# Patient Record
Sex: Female | Born: 1980 | Race: Black or African American | Hispanic: No | Marital: Single | Smoking: Former smoker
Health system: Southern US, Community
[De-identification: ages and names within clinical notes are randomized; demographics above are authoritative.]

## PROBLEM LIST (undated history)

## (undated) DIAGNOSIS — Z8619 Personal history of other infectious and parasitic diseases: Secondary | ICD-10-CM

## (undated) DIAGNOSIS — I10 Essential (primary) hypertension: Secondary | ICD-10-CM

## (undated) DIAGNOSIS — J309 Allergic rhinitis, unspecified: Secondary | ICD-10-CM

## (undated) DIAGNOSIS — B029 Zoster without complications: Secondary | ICD-10-CM

## (undated) DIAGNOSIS — J45909 Unspecified asthma, uncomplicated: Secondary | ICD-10-CM

## (undated) HISTORY — DX: Personal history of other infectious and parasitic diseases: Z86.19

## (undated) HISTORY — DX: Essential (primary) hypertension: I10

## (undated) HISTORY — DX: Zoster without complications: B02.9

## (undated) HISTORY — DX: Allergic rhinitis, unspecified: J30.9

## (undated) HISTORY — DX: Unspecified asthma, uncomplicated: J45.909

---

## 2004-04-22 ENCOUNTER — Emergency Department: Payer: Self-pay | Admitting: Emergency Medicine

## 2008-01-20 ENCOUNTER — Ambulatory Visit: Payer: Self-pay | Admitting: Nurse Practitioner

## 2008-03-30 ENCOUNTER — Encounter: Payer: Self-pay | Admitting: Nurse Practitioner

## 2008-03-30 ENCOUNTER — Encounter: Payer: Self-pay | Admitting: Family Medicine

## 2008-03-30 ENCOUNTER — Ambulatory Visit: Payer: Self-pay | Admitting: Nurse Practitioner

## 2008-03-30 LAB — CONVERTED CEMR LAB
ALT: 19 units/L (ref 0–35)
AST: 15 units/L (ref 0–37)
Albumin: 4.1 g/dL (ref 3.5–5.2)
Alkaline Phosphatase: 51 units/L (ref 39–117)
BUN: 9 mg/dL (ref 6–23)
CO2: 20 meq/L (ref 19–32)
Calcium: 8.8 mg/dL (ref 8.4–10.5)
Chloride: 110 meq/L (ref 96–112)
Creatinine, Ser: 0.53 mg/dL (ref 0.40–1.20)
Glucose, Bld: 103 mg/dL — ABNORMAL HIGH (ref 70–99)
HCT: 39 % (ref 36.0–46.0)
Hemoglobin: 13 g/dL (ref 12.0–15.0)
MCHC: 33.3 g/dL (ref 30.0–36.0)
MCV: 85.9 fL (ref 78.0–100.0)
Platelets: 245 10*3/uL (ref 150–400)
Potassium: 4.1 meq/L (ref 3.5–5.3)
RBC: 4.54 M/uL (ref 3.87–5.11)
RDW: 14.2 % (ref 11.5–15.5)
Sodium: 143 meq/L (ref 135–145)
TSH: 1.473 microintl units/mL (ref 0.350–4.50)
Total Bilirubin: 0.5 mg/dL (ref 0.3–1.2)
Total Protein: 6.4 g/dL (ref 6.0–8.3)
WBC: 6.5 10*3/uL (ref 4.0–10.5)

## 2008-03-31 ENCOUNTER — Encounter: Payer: Self-pay | Admitting: Family Medicine

## 2008-03-31 LAB — CONVERTED CEMR LAB
Clue Cells Wet Prep HPF POC: NONE SEEN
Trich, Wet Prep: NONE SEEN
Yeast Wet Prep HPF POC: NONE SEEN

## 2009-04-05 ENCOUNTER — Ambulatory Visit: Payer: Self-pay | Admitting: Obstetrics and Gynecology

## 2009-05-03 ENCOUNTER — Ambulatory Visit: Payer: Self-pay | Admitting: Obstetrics & Gynecology

## 2009-06-07 ENCOUNTER — Ambulatory Visit: Payer: Self-pay | Admitting: Obstetrics & Gynecology

## 2009-06-09 ENCOUNTER — Ambulatory Visit: Payer: Self-pay | Admitting: Obstetrics & Gynecology

## 2009-06-10 ENCOUNTER — Encounter: Payer: Self-pay | Admitting: Obstetrics & Gynecology

## 2009-11-08 ENCOUNTER — Ambulatory Visit: Payer: Self-pay | Admitting: Nurse Practitioner

## 2009-11-09 ENCOUNTER — Encounter: Payer: Self-pay | Admitting: Obstetrics & Gynecology

## 2009-11-09 LAB — CONVERTED CEMR LAB
Trich, Wet Prep: NONE SEEN
Yeast Wet Prep HPF POC: NONE SEEN

## 2010-07-11 NOTE — Assessment & Plan Note (Signed)
NAME:  Sabrina Smith, Sabrina Smith NO.:  [id number]   MEDICAL RECORD NO.:  [id number]          PATIENT TYPE:  POB   LOCATION:  [facility] at [facility]         FACILITY:  [facility]   PHYSICIAN:  Allie Bossier, MD        DATE OF BIRTH:  05-Nov-1980   DATE OF SERVICE:  11/08/2009                                  CLINIC NOTE   The patient comes to the office today for a repeat Pap smear following  her colposcopy in March 2011.  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  The patient has been doing well since then except that she does  feel that she is having repeat of her bacterial vaginosis.  She has been  trying some over-the-counter medications.   PHYSICAL EXAMINATION:  GENERAL:  Well-developed, well-nourished 30-year-  old African American female in no acute distress.  VITAL SIGNS:  Blood pressure is 93/64, weight 158, pulse is 73, height  is 5 feet 3 inches.  PELVIC:  Genital externally.  There are no lesions or discharges  internally.  Vaginal vault, there is a thick white odorous discharge.  Cervix is without lesions.  Bimanual exam, no cervical motion  tenderness.   ASSESSMENT:  1. 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  2. Bacterial vaginosis.  The patient is given a prescription for      Tindamax 500 mg 4 tablets to be taken at one time.  She will be      given a refill on that.  She is also given 2 sample tablets.  She      will return in February for her yearly exam or sooner as needed.      Remonia Richter, NP    ______________________________  Allie Bossier, MD    LR/MEDQ  D:  11/08/2009  T:  11/09/2009  Job:  [id number]

## 2010-07-11 NOTE — Assessment & Plan Note (Signed)
NAME:  Sabrina Smith, Sabrina Smith NO.:  [id number]   MEDICAL RECORD NO.:  [id number]          PATIENT TYPE:  POB   LOCATION:  [facility] at [facility]         FACILITY:  [facility]   PHYSICIAN:  Catalina Antigua, MD     DATE OF BIRTH:  29-Apr-1980   DATE OF SERVICE:  04/05/2009                                  CLINIC NOTE   This is a 30 year old gravida 2, para 1-0-1-1, who presents today for  annual exam.  The patient is without any complaints.  Denies any  abnormal bleeding or discharge or pain.  The patient is no longer taking  her birth control pills as she states that she has not really been  complaint with taking them on a daily basis.  She is currently using  condoms for birth control on an on and off basis.   Past medical history is significant for asthma for which she takes  albuterol on a p.r.n. basis.   PAST SURGICAL HISTORY:  She denies.   PAST OB HISTORY:  She had a vaginal delivery and a miscarriage.   PAST GYN HISTORY:  She denies any history of abnormal Pap smear, cyst,  or fibroids.  Her last Pap smear was normal in January 2009.   SOCIAL HISTORY:  She denies drinking, smoking, or the use of illicit  drugs.   She reports an allergy to SULFA, which she breaks out in hives.   She is not currently taking any medications.   REVIEW OF SYSTEMS:  Otherwise within normal limits.   PHYSICAL EXAMINATION:  VITAL SIGNS:  Blood pressure is 102/65, pulse of  75, weight of 154 pounds.  LUNGS:  Clear to auscultation bilaterally.  HEART:  Regular rate and rhythm.  BREASTS:  Equal in size.  No skin or nipple dimpling.  No expressible  nipple discharge.  No palpable masses or lymphadenopathy.  ABDOMEN:  Soft, nontender, nondistended.  PELVIC:  She has normal vaginal mucosa and normal-appearing cervix.  No  abnormal bleeding or discharge.  Bimanual exam, she has a small  anteverted uterus.  No palpable adnexal masses or tenderness.   ASSESSMENT AND PLAN:  This is a 30 year old  para 1-0-1-1, who presents  for annual exam.  Pap smear and cultures were performed.  Birth control  options were reviewed.  The patient was previously counseled for implant  on an intrauterine device and is now opting for intrauterine device  placement.  The patient declined intrauterine device placement today and  states that she will schedule the insertion and information packet for  Mirena intrauterine device was provided.  The patient will be contacted  with any abnormal results.  The patient will return for intrauterine  device placement and otherwise in a year for annual exam.  The patient  was also advised to continue the religious use of condoms for protection  against sexually transmitted diseases.           ______________________________  Catalina Antigua, MD     PC/MEDQ  D:  04/05/2009  T:  04/06/2009  Job:  [id number]

## 2010-07-11 NOTE — Assessment & Plan Note (Signed)
NAME:  Sabrina Smith, KUNA NO.:  [id number]   MEDICAL RECORD NO.:  [id number]          PATIENT TYPE:  POB   LOCATION:  [facility] at [facility]         FACILITY:  [facility]   PHYSICIAN:  Johnella Moloney, MD        DATE OF BIRTH:  05/21/1980   DATE OF SERVICE:  01/20/2008                                  CLINIC NOTE   The patient comes to office today for 2 separate issues.  The first is  that she would like to discuss birth-control options.  She had been  given Loestrin previously and she had a breakthrough bleeding for 1  month and this was not acceptable for her.  She could like to discuss  IUDs and also increasing the dose of her birth-control pill.  Her last  menstrual period was December 24, 2007.  She did have some unusual days  of bleeding during that time.  She did take a home pregnancy test last  week which was faintly positive and she took later one which was  negative.  The patient would also like to discuss a vaginal discharge  that comes and goes.  It does have an odor and there is no itching.  She  has no change in sexual partners.   LABORATORY DATA:  GPT was negative.  Wet prep is positive for whiff, pH  is greater than 5.0 and positive for clue cells.   PHYSICAL EXAMINATION:  GENERAL:  Well-developed, well-nourished 30-year-  old African American female in no acute distress.  EXTERNAL GENITALIA:  There is a small amount of white odorous discharge  externally.  Vaginally, the same discharge.  Internally, cervix is  closed.  No lesions.  Bimanual exam, no cervical motion tenderness or  adnexal mass.   ASSESSMENT AND PLAN:  1. Bacterial vaginosis.  The patient will be given Flagyl 500 mg 1      p.o. b.i.d. for 7 days.  She was advised not to drink any alcohol      during this time.  2. Contraception.  We did have a rather lengthy discussion concerning      IUDs and birth control.  At this point, the patient will opt for      birth control pills.  She is given a  prescription for Necon 1 p.o.      daily #28 with 3 refills.  The patient is asked to take this on      Sunday following her menstrual cycle.  She is asked to use condoms      for 1 month.  She will follow up in 2 months for her Pap smear and      we can discuss further an IUD at that time.  She is given a      brochure on the Mirena as well as the ParaGard.      Remonia Richter, NP    ______________________________  Johnella Moloney, MD    LR/MEDQ  D:  01/20/2008  T:  01/21/2008  Job:  [id number]

## 2010-07-11 NOTE — Assessment & Plan Note (Signed)
NAME:  Sabrina Smith, Sabrina Smith NO.:  [id number]   MEDICAL RECORD NO.:  [id number]          PATIENT TYPE:  POB   LOCATION:  [facility] at [facility]         FACILITY:  [facility]   PHYSICIAN:  Argentina Donovan, MD        DATE OF BIRTH:  Aug 01, 1980   DATE OF SERVICE:                                  CLINIC NOTE   The patient comes office today for her yearly Pap smear, physical exam,  and she is also complaining of vaginal discharge.  The patient, on her  last visit back in October, was complaining of similar type of vaginal  discharge.  We did do a wet prep, and she did have BV.  She did use the  Flagyl and realized that the odor came back after approximately 1 week.  There is some question whether her relationship is faithful.  Her  boyfriend may be going back and forth with the mother of his child.  The  patient did not do well with her birth control pills.  She feels that  she has some difficulty staying on a routine with birth control pills,  is not quite sure about the IUD at this point and would like to discuss  the Implanon, which we did at length.  Otherwise, the patient is not  having any problems.  She does request that we do some blood work today  including some sugar tests that her mother is interested in having.   PHYSICAL EXAM:  GENERAL:  Well-developed, well-nourished 30 year old  African American female in no acute distress.  VITAL SIGNS:  Blood pressure 114/71, pulse 82, weight 149, height 5 feet  3 inches.  HEENT:  Head is normocephalic and atraumatic.  Thyroid is without  enlargement or nodules.  CARDIAC:  Regular rate and rhythm.  LUNGS:  Clear bilaterally.  BREASTS:  Without masses.  There is no nipple discharge.  ABDOMEN:  Soft, nontender.  No mass.  GENITALIA:  Externally, there are no lesions or discharges.  There is a  slight odor.  Internally, vaginal walls have a small amount of whitish  discharge, otherwise negative.  Cervix is closed.  No lesions.  No  cervical motion tenderness.  Bimanual Exam:  Again no cervical motion  tenderness or adnexal mass.  No tenderness.  EXTREMITIES:  Good pulses.  Skin color is normal.  Extremities are  within normal range.   ASSESSMENT/PLAN:  1. Yearly Pap and pelvic.  2. Contraception.  3. Bacterial vaginosis.   PLAN:  1. To give the patient verbal as well as written information on the      Implanon.  2. We will give her a prescription for Flagyl 500 mg 1 p.o. b.i.d. for      7 days.  She is aware that she is not to take any alcohol with      this.  She and her partner are no longer sexually active.  3. CBC, C-MET, and a TSH will be done on this patient today, and she      will return to the clinic in 1 week for Implanon insertion, and we      will discuss her labs at the  same time.      Remonia Richter, NP    ______________________________  Argentina Donovan, MD    LR/MEDQ  D:  03/30/2008  T:  03/31/2008  Job:  [id number]

## 2010-07-17 ENCOUNTER — Ambulatory Visit: Payer: Self-pay

## 2011-04-11 ENCOUNTER — Encounter: Payer: Self-pay | Admitting: Family Medicine

## 2011-04-11 ENCOUNTER — Ambulatory Visit (INDEPENDENT_AMBULATORY_CARE_PROVIDER_SITE_OTHER): Payer: Federal, State, Local not specified - PPO | Admitting: Family Medicine

## 2011-04-11 VITALS — BP 110/72 | HR 68 | Temp 98.7°F | Ht 62.0 in | Wt 145.2 lb

## 2011-04-11 DIAGNOSIS — Z0000 Encounter for general adult medical examination without abnormal findings: Secondary | ICD-10-CM | POA: Insufficient documentation

## 2011-04-11 DIAGNOSIS — R351 Nocturia: Secondary | ICD-10-CM | POA: Insufficient documentation

## 2011-04-11 DIAGNOSIS — Z23 Encounter for immunization: Secondary | ICD-10-CM

## 2011-04-11 DIAGNOSIS — R209 Unspecified disturbances of skin sensation: Secondary | ICD-10-CM

## 2011-04-11 DIAGNOSIS — R202 Paresthesia of skin: Secondary | ICD-10-CM | POA: Insufficient documentation

## 2011-04-11 LAB — POCT URINALYSIS DIPSTICK
Bilirubin, UA: NEGATIVE
Blood, UA: NEGATIVE
Glucose, UA: NEGATIVE
Ketones, UA: NEGATIVE
Leukocytes, UA: NEGATIVE
Nitrite, UA: NEGATIVE
Protein, UA: NEGATIVE
Spec Grav, UA: <=1.005
Urobilinogen, UA: 0.2
pH, UA: 7.5

## 2011-04-11 NOTE — Progress Notes (Signed)
Addended by: Josph Macho A on: 04/11/2011 11:30 AM   Modules accepted: Orders

## 2011-04-11 NOTE — Assessment & Plan Note (Signed)
Reviewed preventative protocols and updated unless pt declined. Tdap today. rec set up appt for well woman with OBGYN.

## 2011-04-11 NOTE — Assessment & Plan Note (Signed)
Isolated, self limited. Likely benign - reassurred. Reasons to notify me discussed - ie worsening, not resolving, associated with pain or weakness.

## 2011-04-11 NOTE — Assessment & Plan Note (Signed)
Check UA today.  

## 2011-04-11 NOTE — Progress Notes (Signed)
Subjective:    Patient ID: Sabrina Smith, female    DOB: Nov 02, 1980, 31 y.o.   MRN: [id number]  HPI CC: new pt establish  Stays cold - mom with h/o anemia.  Wants this evaluated.  LMP - 03/26/2011.  Regular.  1 wk cycle, heavy bleeding only for a few days.    Started working out more.  Noticing tingling in fingers when running on treadmill.  Both hands, all 5 anterior fingers.  Continued running, and tingling did improve.  Also with mild numbness on arm when sleeping, happened in middle of night.  this happened 3 weeks ago.  These episodes have not recurred.  Nocturia - 3-4x/night.  Has noticed this for last week.  Not as much during day.  Last UTI was over summer.  No dysuria, urgency, back/flank pain, abd pain.  Preventative: Unsure last CPE.   Last well woman with [facility]. Tetanus - thinks >10 yrs ago. Not fasting today.  Caffeine: none Lives with daughter (2006), no pets Occupation: Paramedic Edu: bachelor's Activity: treadmill and workout tapes Diet: seldom water, fruits/vegetables daily, red meat rare, fish 3x/wk  Medications and allergies reviewed and updated in chart.  Past histories reviewed and updated if relevant as below. There is no problem list on file for this patient.  Past Medical History  Diagnosis Date  . History of chicken pox   . Shingles 1990s   Past Surgical History  Procedure Date  . No past surgeries    History  Substance Use Topics  . Smoking status: Former Smoker    Quit date: 09/27/2010  . Smokeless tobacco: Never Used  . Alcohol Use: Yes     Occasional   Family History  Problem Relation Age of Onset  . Diabetes Maternal Aunt   . Hypertension Maternal Aunt   . Diabetes Maternal Uncle   . Hypertension Maternal Uncle   . Stroke Maternal Uncle   . Heart disease Maternal Grandmother   . Diabetes Maternal Grandfather   . Cancer Maternal Grandfather     prostate   Allergies  Allergen Reactions  . Sulfa Drugs Cross  Reactors Hives   No current outpatient prescriptions on file prior to visit.     Review of Systems  Constitutional: Negative for fever, chills, activity change, appetite change, fatigue and unexpected weight change.  HENT: Negative for hearing loss and neck pain.   Eyes: Negative for visual disturbance.  Respiratory: Negative for cough, chest tightness, shortness of breath and wheezing.   Cardiovascular: Negative for chest pain, palpitations and leg swelling.  Gastrointestinal: Negative for nausea, vomiting, abdominal pain, diarrhea, constipation, blood in stool and abdominal distention.  Genitourinary: Positive for frequency (3-4x nocturia). Negative for hematuria and difficulty urinating.  Musculoskeletal: Negative for myalgias and arthralgias.  Skin: Negative for rash.  Neurological: Negative for dizziness, seizures, syncope and headaches.  Hematological: Does not bruise/bleed easily.  Psychiatric/Behavioral: Negative for dysphoric mood. The patient is not nervous/anxious.        Objective:   Physical Exam  Nursing note and vitals reviewed. Constitutional: She is oriented to person, place, and time. She appears well-developed and well-nourished. No distress.  HENT:  Head: Normocephalic and atraumatic.  Right Ear: External ear normal.  Left Ear: External ear normal.  Nose: Nose normal.  Mouth/Throat: Oropharynx is clear and moist. No oropharyngeal exudate.  Eyes: Conjunctivae and EOM are normal. Pupils are equal, round, and reactive to light. No scleral icterus.  Neck: Normal range of motion. Neck supple.  Cardiovascular:  Normal rate, regular rhythm, normal heart sounds and intact distal pulses.   No murmur heard. Pulses:      Radial pulses are 2+ on the right side, and 2+ on the left side.  Pulmonary/Chest: Effort normal and breath sounds normal. No respiratory distress. She has no wheezes. She has no rales.  Abdominal: Soft. Bowel sounds are normal. She exhibits no  distension and no mass. There is no tenderness. There is no rebound and no guarding.  Musculoskeletal: Normal range of motion.       FROm neck. No midline spine tenderness, no trap tightness/spasm. Neg spurling bilaterally  Lymphadenopathy:    She has no cervical adenopathy.  Neurological: She is alert and oriented to person, place, and time. She has normal strength and normal reflexes. No sensory deficit. She exhibits normal muscle tone.  Reflex Scores:      Bicep reflexes are 2+ on the right side and 2+ on the left side.      Brachioradialis reflexes are 2+ on the right side and 2+ on the left side.      Patellar reflexes are 2+ on the right side and 2+ on the left side.      Achilles reflexes are 2+ on the right side and 2+ on the left side.      CN grossly intact, station and gait intact sensation intact to light touch, to temperature discrimination  Skin: Skin is warm and dry. No rash noted.  Psychiatric: She has a normal mood and affect. Her behavior is normal. Judgment and thought content normal.      Assessment & Plan:

## 2011-04-11 NOTE — Patient Instructions (Signed)
Urinalysis today for nocturia Return at your convenience fasting for blood work. Tdap today. Good to meet you today, call us with questions.

## 2011-04-16 ENCOUNTER — Other Ambulatory Visit (INDEPENDENT_AMBULATORY_CARE_PROVIDER_SITE_OTHER): Payer: Federal, State, Local not specified - PPO

## 2011-04-16 DIAGNOSIS — Z0000 Encounter for general adult medical examination without abnormal findings: Secondary | ICD-10-CM

## 2011-04-16 DIAGNOSIS — R202 Paresthesia of skin: Secondary | ICD-10-CM

## 2011-04-16 DIAGNOSIS — R209 Unspecified disturbances of skin sensation: Secondary | ICD-10-CM

## 2011-04-16 LAB — CBC WITH DIFFERENTIAL/PLATELET
Basophils Absolute: 0 10*3/uL (ref 0.0–0.1)
Basophils Relative: 0.2 % (ref 0.0–3.0)
Eosinophils Absolute: 0.3 10*3/uL (ref 0.0–0.7)
Eosinophils Relative: 3.9 % (ref 0.0–5.0)
HCT: 39.9 % (ref 36.0–46.0)
Hemoglobin: 13 g/dL (ref 12.0–15.0)
Lymphocytes Relative: 24.8 % (ref 12.0–46.0)
Lymphs Abs: 1.8 10*3/uL (ref 0.7–4.0)
MCHC: 32.6 g/dL (ref 30.0–36.0)
MCV: 88.4 fl (ref 78.0–100.0)
Monocytes Absolute: 0.7 10*3/uL (ref 0.1–1.0)
Monocytes Relative: 9 % (ref 3.0–12.0)
Neutro Abs: 4.6 10*3/uL (ref 1.4–7.7)
Neutrophils Relative %: 62.1 % (ref 43.0–77.0)
Platelets: 210 10*3/uL (ref 150.0–400.0)
RBC: 4.51 Mil/uL (ref 3.87–5.11)
RDW: 14.4 % (ref 11.5–14.6)
WBC: 7.3 10*3/uL (ref 4.5–10.5)

## 2011-04-16 LAB — LIPID PANEL
Cholesterol: 127 mg/dL (ref 0–200)
HDL: 44.1 mg/dL (ref >39.00)
LDL Cholesterol: 70 mg/dL (ref 0–99)
Total CHOL/HDL Ratio: 3
Triglycerides: 64 mg/dL (ref 0.0–149.0)
VLDL: 12.8 mg/dL (ref 0.0–40.0)

## 2011-04-16 LAB — BASIC METABOLIC PANEL
BUN: 14 mg/dL (ref 6–23)
CO2: 27 mEq/L (ref 19–32)
Calcium: 8.8 mg/dL (ref 8.4–10.5)
Chloride: 108 mEq/L (ref 96–112)
Creatinine, Ser: 0.7 mg/dL (ref 0.4–1.2)
GFR: 113.71 mL/min (ref >60.00)
Glucose, Bld: 89 mg/dL (ref 70–99)
Potassium: 3.9 mEq/L (ref 3.5–5.1)
Sodium: 141 mEq/L (ref 135–145)

## 2011-04-16 LAB — VITAMIN B12: Vitamin B-12: 727 pg/mL (ref 211–911)

## 2011-04-16 LAB — TSH: TSH: 1.41 u[IU]/mL (ref 0.35–5.50)

## 2012-01-30 ENCOUNTER — Ambulatory Visit (INDEPENDENT_AMBULATORY_CARE_PROVIDER_SITE_OTHER): Payer: Federal, State, Local not specified - PPO | Admitting: Internal Medicine

## 2012-01-30 ENCOUNTER — Encounter: Payer: Self-pay | Admitting: Family Medicine

## 2012-01-30 ENCOUNTER — Encounter: Payer: Self-pay | Admitting: Internal Medicine

## 2012-01-30 VITALS — BP 98/60 | HR 60 | Temp 97.9°F | Wt 149.0 lb

## 2012-01-30 DIAGNOSIS — J0100 Acute maxillary sinusitis, unspecified: Secondary | ICD-10-CM | POA: Insufficient documentation

## 2012-01-30 DIAGNOSIS — J019 Acute sinusitis, unspecified: Secondary | ICD-10-CM

## 2012-01-30 MED ORDER — AMOXICILLIN 500 MG PO TABS: 1000.0000 mg | ORAL_TABLET | Freq: Two times a day (BID) | ORAL | Status: DC

## 2012-01-30 NOTE — Progress Notes (Signed)
  Subjective:    Patient ID: Sabrina Smith, female    DOB: 06-28-1980, 31 y.o.   MRN: [id number]  HPI Has been achy for the past 2 days Stiff neck at first--4 days ago Some earaches also--2 days ago Cough for several weeks Some chest congestion for the past month mucinex had loosened up the mucussome but now not productive again  No fever No SOB Headache last night---no others No sore throat Mild rhinorrhea but not overly congested  No other meds for this  No current outpatient prescriptions on file prior to visit.    Allergies  Allergen Reactions  . Sulfa Drugs Cross Reactors Hives    Past Medical History  Diagnosis Date  . History of chicken pox   . Shingles 1990s    Past Surgical History  Procedure Date  . No past surgeries     Family History  Problem Relation Age of Onset  . Diabetes Maternal Aunt   . Hypertension Maternal Aunt   . Diabetes Maternal Uncle   . Hypertension Maternal Uncle   . Stroke Maternal Uncle   . Heart disease Maternal Grandmother   . Diabetes Maternal Grandfather   . Cancer Maternal Grandfather     prostate    History   Social History  . Marital Status: Single    Spouse Name: N/A    Number of Children: N/A  . Years of Education: N/A   Occupational History  . Sales Associate [employer]   Social History Main Topics  . Smoking status: Former Smoker    Quit date: 09/27/2010  . Smokeless tobacco: Never Used  . Alcohol Use: Yes     Comment: Occasional  . Drug Use: No  . Sexually Active: Not on file   Other Topics Concern  . Not on file   Social History Narrative   Caffeine: noneLives with daughter (2006), no petsOccupation: postal workerEdu: bachelor'sActivity: treadmill and workout tapesDiet: seldom water, fruits/vegetables daily, red meat rare, fish 3x/wk   Review of Systems No rash No pink eye No vomiting or diarrhea    Objective:   Physical Exam  Constitutional: She appears well-developed and  well-nourished. No distress.  HENT:       No sinus tenderness Marked sinus congestion and mild inflammation Mild pharyngeal injection TMs normal  Neck: Normal range of motion. Neck supple.  Pulmonary/Chest: Effort normal and breath sounds normal. No respiratory distress. She has no wheezes. She has no rales.  Lymphadenopathy:    She has no cervical adenopathy.          Assessment & Plan:

## 2012-01-30 NOTE — Assessment & Plan Note (Addendum)
Has had mild resp illness for several weeks While she localized it to chest, now has findings in nose ?secondary bacterial infection (with sudden aches, etc) Discussed supportive care Start amoxil if worsens

## 2012-01-30 NOTE — Patient Instructions (Signed)
Please start the amoxicillin antibiotic if you are worsening in the next few days. 

## 2012-02-13 ENCOUNTER — Telehealth: Payer: Self-pay | Admitting: Family Medicine

## 2012-02-13 NOTE — Telephone Encounter (Signed)
Patient Information:  Caller Name: Ventura Bruns  Phone: [phone]  Patient: Sabrina Smith, Sabrina Smith  Gender: Female  DOB: 1980/11/29  Age: 31 Years  [facility]  Pregnant: No  Office Follow Up:  Does the office need to follow up with this patient?: No  Instructions For The Office: N/A  RN Note:  Completed Amoxicillin 02/10/12 for sinusitis.  No headache.  Fever not tested.; feels chilled.  Nothing comes out when blows nose. Sevee sinus pain present.  Office is closed; advised to go to [facility] urgent care now  Symptoms  Reason For Call & Symptoms: Pain behind nose and body aches,  Reviewed Health History In EMR: Yes  Reviewed Medications In EMR: Yes  Reviewed Allergies In EMR: Yes  Reviewed Surgeries / Procedures: Yes  Date of Onset of Symptoms: 02/12/2012  Treatments Tried: Ibuprofen, Sudafed  Treatments Tried Worked: Yes OB:  LMP: 01/30/2012  Guideline(s) Used:  Sinus Pain and Congestion  Disposition Per Guideline:   Go to ED Now (or to Office with PCP Approval)  Reason For Disposition Reached:   Severe headache and has fever  Advice Given:  Reassurance:   Sinus congestion is a normal part of a cold.  For a Stuffy Nose - Use Nasal Washes:  Introduction: Saline (salt water) nasal irrigation (nasal wash) is an effective and simple home remedy for treating stuffy nose and sinus congestion. The nose can be irrigated by pouring, spraying, or squirting salt water into the nose and then letting it run back out.  Medicines for a Stuffy or Runny Nose:  Most cold medicines that are available over-the-counter (OTC) are not helpful.  Call Back If:   You become worse.  RN Overrode Recommendation:  Go To [facility] (office is closed)

## 2012-02-14 NOTE — Telephone Encounter (Signed)
Noted  

## 2012-06-05 IMAGING — US TRANSABDOMINAL ULTRASOUND OF PELVIS
1 series · 17 of 25 positions shown · non-contrast
Comparison: none

REASON FOR EXAM: RLQ pain
COMMENTS:

[Series 1: transabdominal ultrasound of pelvis · 17 of 49 slices shown]
[im 1/49]
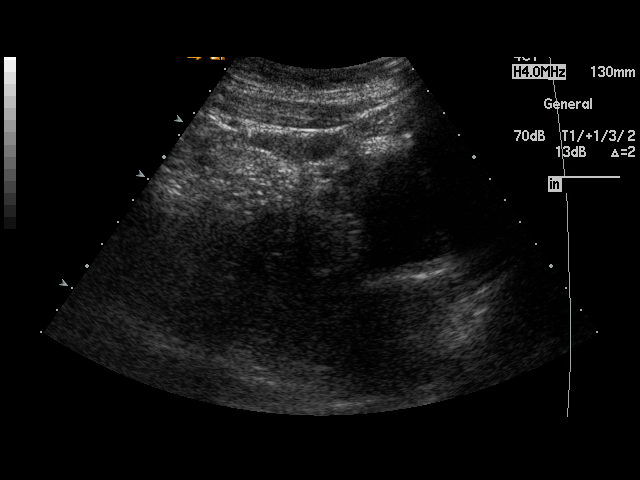
[im 5/49]
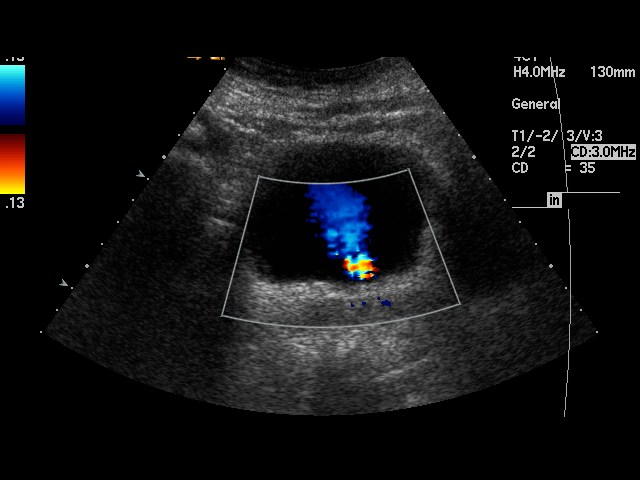
[im 7/49]
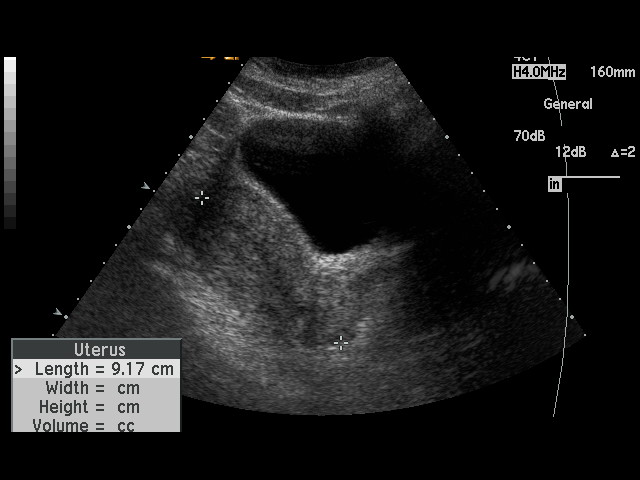
[im 11/49]
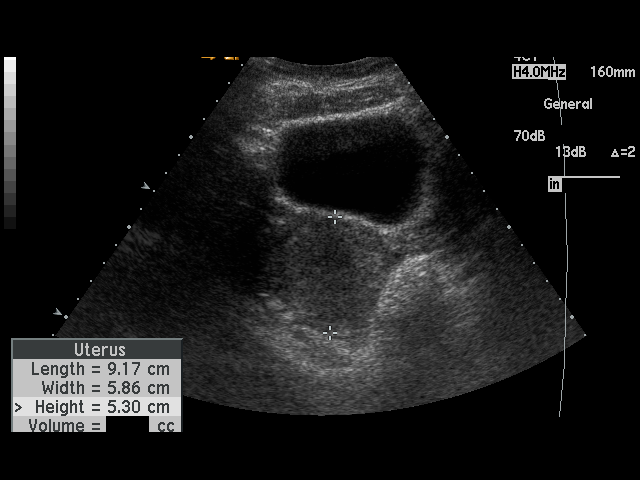
[im 13/49]
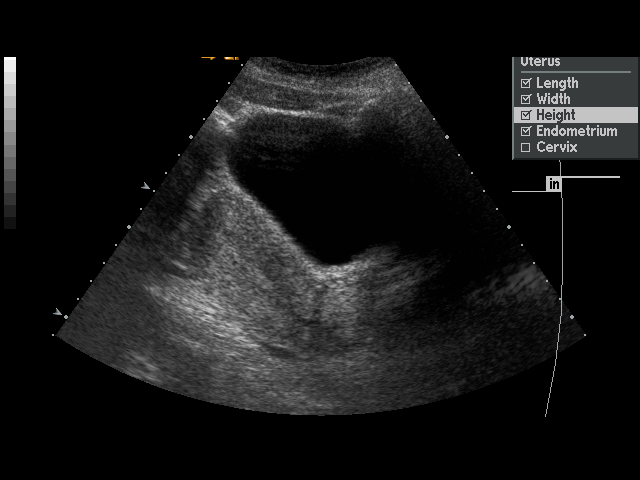
[im 17/49]
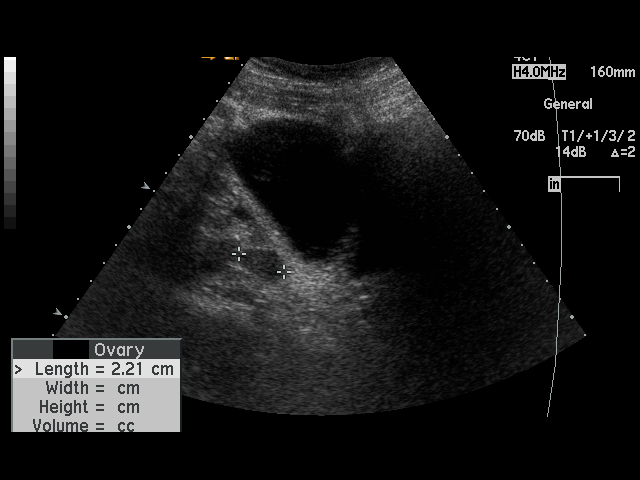
[im 19/49]
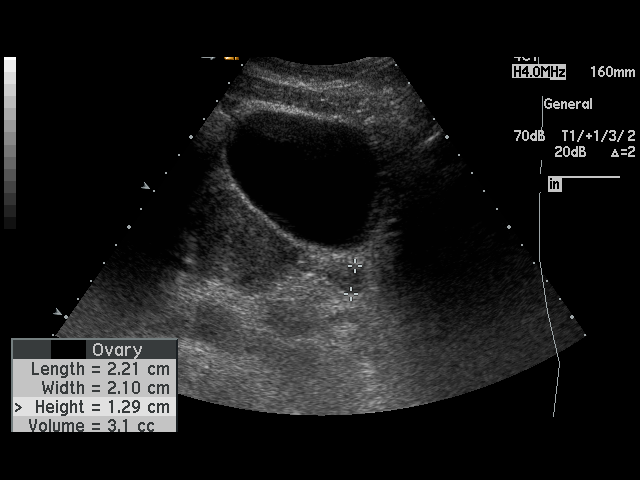
[im 23/49]
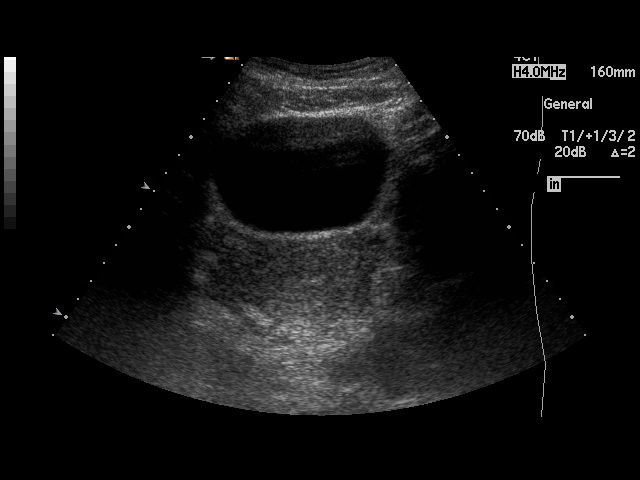
[im 25/49]
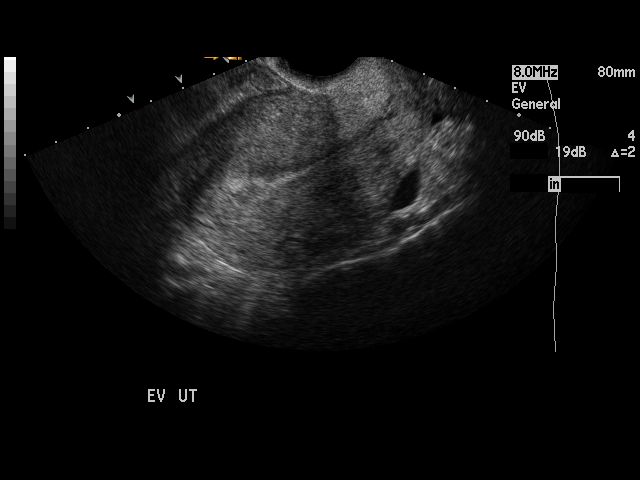
[im 27/49]
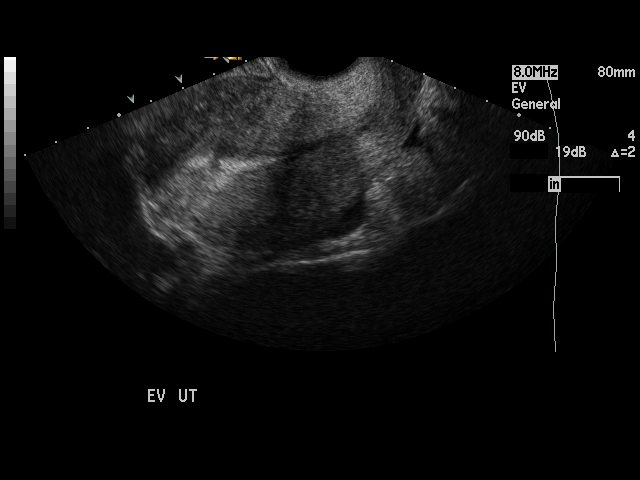
[im 31/49]
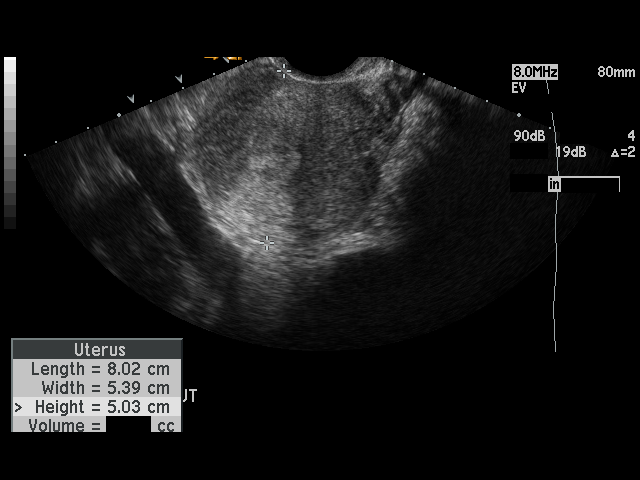
[im 33/49]
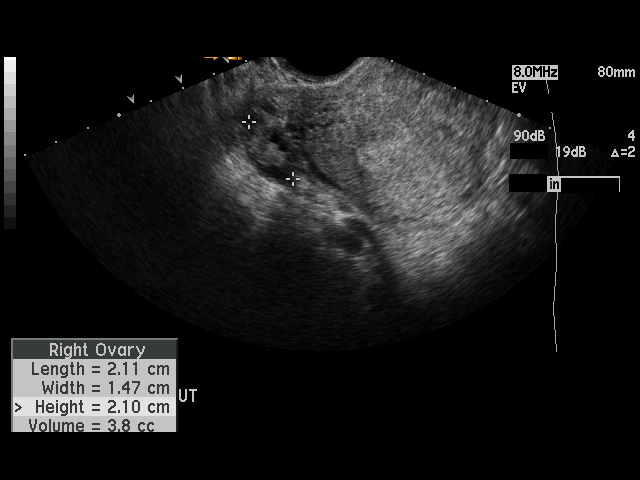
[im 37/49]
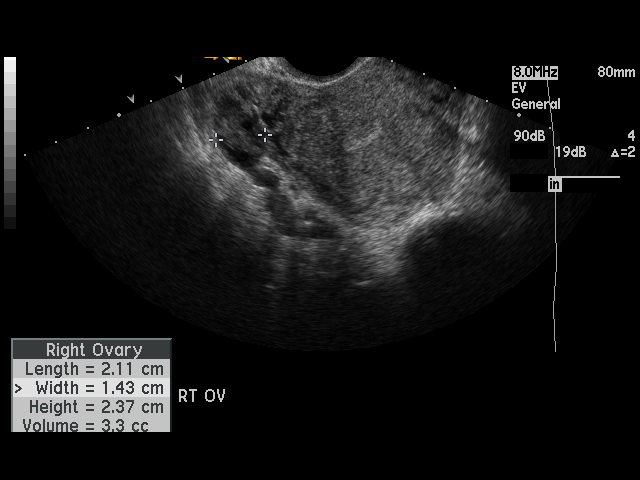
[im 39/49]
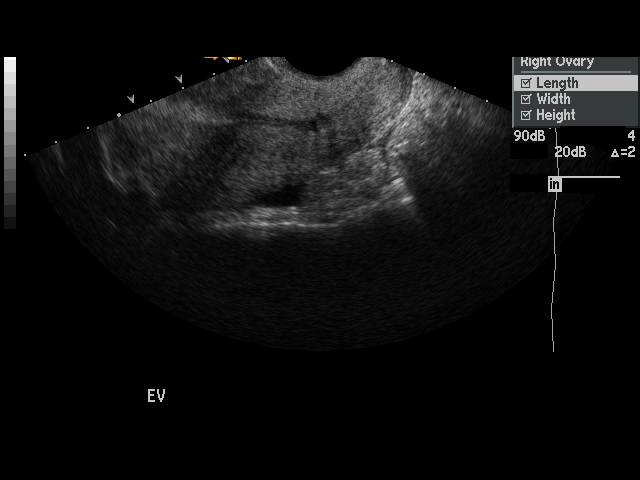
[im 43/49]
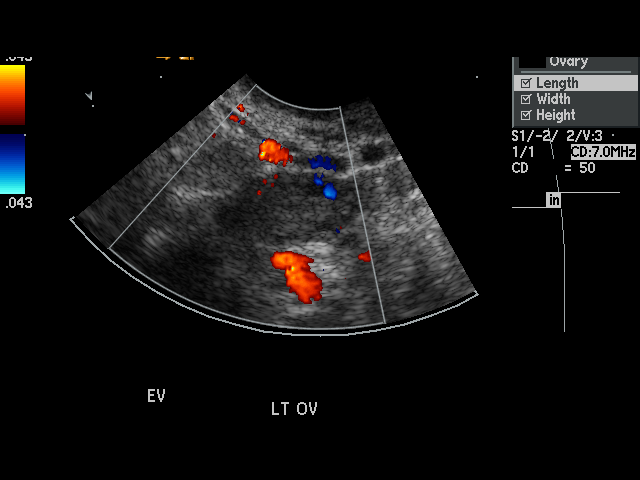
[im 45/49]
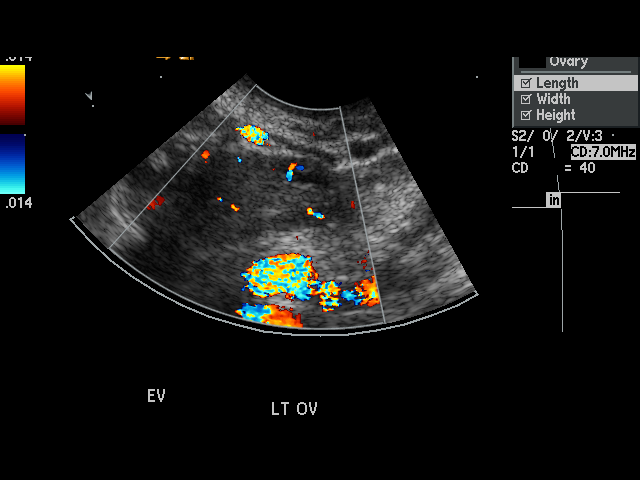
[im 49/49]
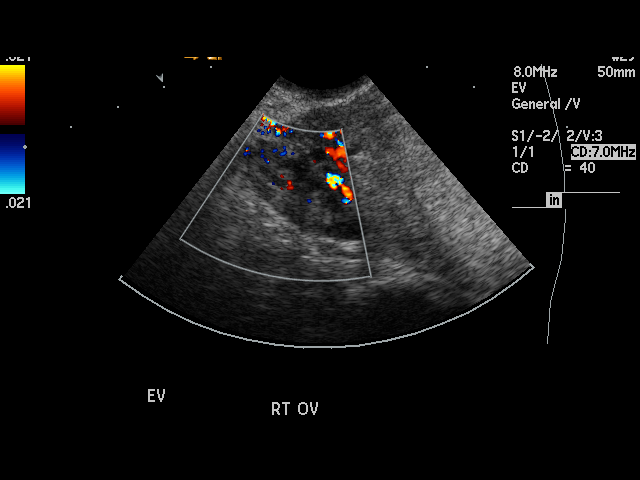

[17 of 25 positions shown; findings below may reference images not displayed]

PROCEDURE:     CRUNCHIEE - CRUNCHIEE PELVIS NON-OB W/TRANSVAGINAL  - July 17, 2010  [DATE]

RESULT:     Transabdominal and endovaginal ultrasound was performed. The
uterus measures 8.59 cm x 5.62 cm x 5.17 cm. The endometrium measures 1.9 mm
in thickness. No uterine mass is seen. The right and left ovaries are
visualized. The right ovary measures 2 cm at maximum diameter and the left
ovary measures 2.2 cm at maximum diameter. Vascular flow is seen in each
ovary on Doppler examination. No torsion is evident. No abnormal adnexal
masses are identified. A few follicles are noted in each ovary. There is a
nonspecific trace of free fluid in the pelvis. The visualized portion of the
urinary bladder is normal in appearance. The kidneys are visualized
bilaterally and show no hydronephrosis.
IMPRESSION: No significant abnormalities are identified.

## 2012-12-08 ENCOUNTER — Ambulatory Visit (INDEPENDENT_AMBULATORY_CARE_PROVIDER_SITE_OTHER): Payer: Federal, State, Local not specified - PPO | Admitting: Family Medicine

## 2012-12-08 ENCOUNTER — Encounter: Payer: Self-pay | Admitting: Family Medicine

## 2012-12-08 VITALS — BP 118/82 | HR 68 | Temp 98.7°F | Wt 144.8 lb

## 2012-12-08 DIAGNOSIS — R5383 Other fatigue: Secondary | ICD-10-CM | POA: Insufficient documentation

## 2012-12-08 DIAGNOSIS — R61 Generalized hyperhidrosis: Secondary | ICD-10-CM

## 2012-12-08 DIAGNOSIS — R5381 Other malaise: Secondary | ICD-10-CM

## 2012-12-08 DIAGNOSIS — Z23 Encounter for immunization: Secondary | ICD-10-CM

## 2012-12-08 LAB — CBC WITH DIFFERENTIAL/PLATELET
Basophils Absolute: 0 10*3/uL (ref 0.0–0.1)
Basophils Relative: 0.4 % (ref 0.0–3.0)
Eosinophils Absolute: 0.2 10*3/uL (ref 0.0–0.7)
Eosinophils Relative: 2 % (ref 0.0–5.0)
HCT: 41.1 % (ref 36.0–46.0)
Hemoglobin: 13.6 g/dL (ref 12.0–15.0)
Lymphocytes Relative: 25.8 % (ref 12.0–46.0)
Lymphs Abs: 2 10*3/uL (ref 0.7–4.0)
MCHC: 33 g/dL (ref 30.0–36.0)
MCV: 89.2 fl (ref 78.0–100.0)
Monocytes Absolute: 0.7 10*3/uL (ref 0.1–1.0)
Monocytes Relative: 8.8 % (ref 3.0–12.0)
Neutro Abs: 4.9 10*3/uL (ref 1.4–7.7)
Neutrophils Relative %: 63 % (ref 43.0–77.0)
Platelets: 239 10*3/uL (ref 150.0–400.0)
RBC: 4.61 Mil/uL (ref 3.87–5.11)
RDW: 13.7 % (ref 11.5–14.6)
WBC: 7.8 10*3/uL (ref 4.5–10.5)

## 2012-12-08 LAB — COMPREHENSIVE METABOLIC PANEL
ALT: 17 U/L (ref 0–35)
AST: 18 U/L (ref 0–37)
Albumin: 4.3 g/dL (ref 3.5–5.2)
Alkaline Phosphatase: 39 U/L (ref 39–117)
BUN: 12 mg/dL (ref 6–23)
CO2: 30 mEq/L (ref 19–32)
Calcium: 9.3 mg/dL (ref 8.4–10.5)
Chloride: 102 mEq/L (ref 96–112)
Creatinine, Ser: 0.7 mg/dL (ref 0.4–1.2)
GFR: 103.26 mL/min (ref >60.00)
Glucose, Bld: 82 mg/dL (ref 70–99)
Potassium: 4 mEq/L (ref 3.5–5.1)
Sodium: 141 mEq/L (ref 135–145)
Total Bilirubin: 1 mg/dL (ref 0.3–1.2)
Total Protein: 7.4 g/dL (ref 6.0–8.3)

## 2012-12-08 LAB — TSH: TSH: 1.3 u[IU]/mL (ref 0.35–5.50)

## 2012-12-08 LAB — T4, FREE: Free T4: 1.01 ng/dL (ref 0.60–1.60)

## 2012-12-08 LAB — SEDIMENTATION RATE: Sed Rate: 9 mm/hr (ref 0–22)

## 2012-12-08 LAB — T3, FREE: T3, Free: 2.6 pg/mL (ref 2.3–4.2)

## 2012-12-08 NOTE — Progress Notes (Signed)
  Subjective:    Patient ID: Sabrina Smith, female    DOB: [birth year], 32 y.o.   MRN: [id number]  HPI CC: fatigue  Stays fatigued - bedtime 10pm, wakes up at 6am.  Sometimes goes to bed at 9pm still stays tired.  This has been going on for last few months. Some daytime somnolence.  Easily falls asleep on couch.  Doesn't fall asleep while driving.  No snoring.  No PNdyspnea. Nights sweats. This happens a lot - she has to change when she awakens at night time several times a week.  Not associated with period.  Stays cold - mom with h/o anemia. Wants this evaluated.  Cousin and mom with thyroid issues as well.  Possible constipation. Last workup for this 03/2011 WNL (CBC, BMP, TSH, B12, FLP).  No skin or hair changes, diarrhea.  No appetite or weight changes, fevers/chills, coughing, palpitations, chest pain or tightness, dyspnea.  No abd pain, nausea, rashes.  No recent viral sxs.  No flushing.  No HA, no joint pains. No recent travel.  Born in [place].  Went to [place] 3 yrs ago. Not around prisons or homeless shelters.  No TB exposure she knows of. Nonsmoker.  Occ EtOH.  No rec drugs. Rare caffeine use.  Does drink wine.  Does eat [place] several days a week.  No regular exercise. Wt Readings from Last 3 Encounters:  12/08/12 144 lb 12 oz (65.658 kg)  01/30/12 149 lb (67.586 kg)  04/11/11 145 lb 4 oz (65.885 kg)   Body mass index is 26.47 kg/(m^2).  Some burning in chest last night after she ate spaghetti.  Rarely happens.  tums resolves sxs.  Past Medical History  Diagnosis Date  . History of chicken pox   . Shingles 1990s    Past Surgical History  Procedure Laterality Date  . No past surgeries      Family History  Problem Relation Age of Onset  . Diabetes Maternal Aunt   . Hypertension Maternal Aunt   . Diabetes Maternal Uncle   . Hypertension Maternal Uncle   . Stroke Maternal Uncle   . Heart disease Maternal Grandmother   . Diabetes Maternal Grandfather   . Cancer Maternal  Grandfather     prostate   Review of Systems Per HPI    Objective:   Physical Exam  Nursing note and vitals reviewed. Constitutional: She appears well-developed and well-nourished. No distress.  HENT:  Mouth/Throat: Oropharynx is clear and moist. No oropharyngeal exudate.  Eyes: Conjunctivae and EOM are normal. Pupils are equal, round, and reactive to light. No scleral icterus.  Neck: Normal range of motion. Neck supple. No thyromegaly present.  Cardiovascular: Normal rate, regular rhythm, normal heart sounds and intact distal pulses.   No murmur heard. Pulmonary/Chest: Effort normal and breath sounds normal. No respiratory distress. She has no wheezes. She has no rales.  Abdominal: Soft. Bowel sounds are normal. She exhibits no distension and no mass. There is no tenderness. There is no rebound and no guarding.  Musculoskeletal: She exhibits no edema.  Lymphadenopathy:    She has no cervical adenopathy.  Skin: Skin is warm and dry. No rash noted.  Psychiatric: She has a normal mood and affect.       Assessment & Plan:

## 2012-12-08 NOTE — Addendum Note (Signed)
Addended by: Josph Macho A on: 12/08/2012 12:17 PM   Modules accepted: Orders

## 2012-12-08 NOTE — Assessment & Plan Note (Addendum)
Check CBC, CMP, TSH, PPD today. Update [place] if persistent for further eval. Pt agrees with plan.

## 2012-12-08 NOTE — Patient Instructions (Signed)
Flu shot today. Back off [place] to see if any change in daytime sleepiness Blood work today. Good to see you today, call us with questions.

## 2012-12-08 NOTE — Assessment & Plan Note (Signed)
With daytime somnolence. Check for reversible causes with blood work today. ?tryptophan related - rec back off [place] and wine.

## 2012-12-09 ENCOUNTER — Encounter: Payer: Self-pay | Admitting: *Deleted

## 2012-12-09 LAB — VITAMIN D 25 HYDROXY (VIT D DEFICIENCY, FRACTURES): Vit D, 25-Hydroxy: 28 ng/mL — ABNORMAL LOW (ref 30–89)

## 2012-12-10 LAB — TB SKIN TEST
Induration: 0 mm
TB Skin Test: NEGATIVE

## 2013-01-12 ENCOUNTER — Ambulatory Visit: Payer: Federal, State, Local not specified - PPO | Admitting: Family Medicine

## 2013-01-13 ENCOUNTER — Ambulatory Visit (INDEPENDENT_AMBULATORY_CARE_PROVIDER_SITE_OTHER): Payer: Federal, State, Local not specified - PPO | Admitting: Family Medicine

## 2013-01-13 ENCOUNTER — Encounter: Payer: Self-pay | Admitting: Family Medicine

## 2013-01-13 VITALS — BP 110/70 | HR 68 | Temp 98.1°F | Wt 147.0 lb

## 2013-01-13 DIAGNOSIS — J069 Acute upper respiratory infection, unspecified: Secondary | ICD-10-CM | POA: Insufficient documentation

## 2013-01-13 NOTE — Progress Notes (Signed)
Patient seen and examined with PA student Ricarda Frame.  Note reviewed, agree with assessment and plan unless changes documented in my note.  CC: congestion  3d h/o sore throat and body aches - improved with ibuprofen.  Mild headache and congestion.  Mild cough as well. Tmax 100.8 treated with ibuprofen. Taking plain mucinex as well.  Staying hydrated with plenty of water.  No ST, diarrhea, vomiting, facial pressure, ear or tooth pain, dyspnea or chest pain.  No PNdrainage.  H/o seasonal allergies that worse with weather changes.  No sick contacts at work. H/o childhood asthma. Did receive flu shot. Daughter sick with cough over weekend dx with asthma flare. Not around smokers   Past Medical History  Diagnosis Date  . History of chicken pox   . Shingles 1990s    PE: GEN pleasant AAF NAD HEENT: clear conjunctiva. pearly grey TMs with good light reflex.  Oropharynx without exudate or erythema, mmm.  Nares with erythematous swollen turbinates and congested voice. Mild tender scattered bilateral cervical LAD CVS: nl S1, S2, no m/r/g Pulm: CTAB no crackles or wheezing

## 2013-01-13 NOTE — Assessment & Plan Note (Signed)
Anticipate viral uri given short duration and symptoms. Treat with supportive care as per instructions. Update if not improving as expected.  pt agrees with plan.

## 2013-01-13 NOTE — Patient Instructions (Signed)
Sounds like you have a viral upper respiratory infection. Antibiotics are not needed for this.  Viral infections usually take 7-10 days to resolve.  The cough can last a few weeks to go away. Continue ibuprofen and mucinex with plenty of water to help mobilize mucous. Push fluids and plenty of rest. Please return if you are not improving as expected, or if you have high fevers (>101.5) or difficulty swallowing or worsening productive cough. Call clinic with questions.  Good to see you today.

## 2013-01-13 NOTE — Progress Notes (Signed)
  Subjective:    Patient ID: Niaomi Cartaya, female    DOB: [birth year], 32 y.o.   MRN: [id number]  HPI CC: body aches, congestion, chills since yesterday  Sx began Saturday with scratchy throat. Body aches on Sunday. Body aches again on Monday which improved with  ibuprofen. At 4:00am this morning, aches returned. Ibuprofen helped again. Subjective fever Sunday. Fever early Monday (100.8). Began mucinex yesterday. Productive cough (yellow/green). Mild nasal congestion and headaches. Sore throat has resolved.   Denies sore throat, itchy eyes, sneezing, post nasal drip, nausea, vomiting, diarrhea, facial pressure, tooth or ear pain, shortness of breath, chest pain.   Hx of seasonal allergies that worsen with weather changes and childhood asthma (no recent issues).No hx smoke exposure.   Flu shot here December 08, 2012.  Daughter was feverish with cough on Saturday & diagnosed with asthma flare yesterday.   Review of Systems Per HPI    Objective:   Physical Exam  Constitutional: She appears well-developed and well-nourished. No distress.  HENT:  Head: Normocephalic and atraumatic.  Right Ear: Tympanic membrane, external ear and ear canal normal.  Left Ear: Tympanic membrane, external ear and ear canal normal.  Nose: Mucosal edema present.  Mouth/Throat: Oropharynx is clear and moist. No oropharyngeal exudate, posterior oropharyngeal edema, posterior oropharyngeal erythema or tonsillar abscesses.  Marked vessels right ear.  Eyes: Conjunctivae and EOM are normal. Pupils are equal, round, and reactive to light. Right eye exhibits no discharge. Left eye exhibits no discharge. No scleral icterus.  Neck: Normal range of motion. Neck supple.  Cardiovascular: Normal rate, regular rhythm and normal heart sounds.   Pulmonary/Chest: Effort normal and breath sounds normal. No respiratory distress. She has no wheezes. She has no rales.  Abdominal: Soft. There is no tenderness. There is no guarding.   Lymphadenopathy:    She has cervical adenopathy (L>R).  Skin: Skin is warm and dry. No rash noted. She is not diaphoretic. No erythema.       Assessment & Plan:  Anticipate viral URI. Symptoms will likely resolve within 7-10 days. Continue ibuprofen and mucinex as needed. Stay well hydrated. Contact us if symptoms fail to improve or worsen or if fevers over 101.

## 2013-01-13 NOTE — Progress Notes (Signed)
Pre-visit discussion using our clinic review tool. No additional management support is needed unless otherwise documented below in the visit note.  

## 2013-12-14 ENCOUNTER — Ambulatory Visit (INDEPENDENT_AMBULATORY_CARE_PROVIDER_SITE_OTHER): Payer: Federal, State, Local not specified - PPO | Admitting: Family Medicine

## 2013-12-14 ENCOUNTER — Encounter: Payer: Self-pay | Admitting: Family Medicine

## 2013-12-14 VITALS — BP 114/62 | HR 68 | Temp 98.2°F | Resp 16 | Ht 62.0 in | Wt 157.2 lb

## 2013-12-14 DIAGNOSIS — Z9189 Other specified personal risk factors, not elsewhere classified: Secondary | ICD-10-CM

## 2013-12-14 DIAGNOSIS — Z23 Encounter for immunization: Secondary | ICD-10-CM

## 2013-12-14 DIAGNOSIS — Z0000 Encounter for general adult medical examination without abnormal findings: Secondary | ICD-10-CM

## 2013-12-14 DIAGNOSIS — Z9289 Personal history of other medical treatment: Secondary | ICD-10-CM

## 2013-12-14 DIAGNOSIS — Z2839 Other underimmunization status: Secondary | ICD-10-CM

## 2013-12-14 DIAGNOSIS — Z0279 Encounter for issue of other medical certificate: Secondary | ICD-10-CM

## 2013-12-14 NOTE — Progress Notes (Signed)
Pre visit review using our clinic review tool, if applicable. No additional management support is needed unless otherwise documented below in the visit note. 

## 2013-12-14 NOTE — Assessment & Plan Note (Addendum)
Preventative protocols reviewed and updated unless pt declined. Discussed healthy diet and lifestyle. PPD placed today, flu shot today. Titers to be drawn when pt returns for fasting labwork.

## 2013-12-14 NOTE — Patient Instructions (Signed)
Flu shot today. PPD placed today. Return at your convenience fasting one morning for blood work (tomorrow).

## 2013-12-14 NOTE — Addendum Note (Signed)
Addended by: Royann Shivers A on: 12/14/2013 02:43 PM   Modules accepted: Orders

## 2013-12-14 NOTE — Progress Notes (Signed)
BP 114/62  Pulse 68  Temp(Src) 98.2 F (36.8 C) (Oral)  Resp 16  Ht 5\' 2"  (1.575 m)  Wt 157 lb 4 oz (71.328 kg)  BMI 28.75 kg/m2  LMP 12/13/2013   CC: CPE  Subjective:    Patient ID: Sabrina Smith, female    DOB: 1980-04-02, 33 y.o.   MRN: [id number]  HPI: Sabrina Smith is a 33 y.o. female presenting on 12/14/2013 for Annual Exam   To start working at [employer] through [employer] on weekends, to continue working at post office.  Preventative: Last well woman with [facility] 2015.  Flu shot today. Tdap 2013 Not fasting today. Seat belt use discussed No changing or suspicious moles on the skin.  Caffeine: none  Lives with daughter (2006), no pets  Occupation: Tour manager  Edu: bachelor's, taking nursing classes Activity: workout tapes  Diet: good water, fruits/vegetables daily, red meat rare, fish 3x/wk  Relevant past medical, surgical, family and social history reviewed and updated as indicated.  Allergies and medications reviewed and updated. No current outpatient prescriptions on file prior to visit.   No current facility-administered medications on file prior to visit.    Review of Systems  Constitutional: Negative for fever, chills, activity change, appetite change, fatigue and unexpected weight change.  HENT: Negative for hearing loss.   Eyes: Negative for visual disturbance.  Respiratory: Negative for cough, chest tightness, shortness of breath and wheezing.   Cardiovascular: Negative for chest pain, palpitations and leg swelling.  Gastrointestinal: Negative for nausea, vomiting, abdominal pain, diarrhea, constipation, blood in stool and abdominal distention.  Genitourinary: Negative for hematuria and difficulty urinating.  Musculoskeletal: Negative for arthralgias, myalgias and neck pain.  Skin: Negative for rash.  Neurological: Negative for dizziness, seizures, syncope and headaches.  Hematological: Negative for adenopathy. Does not  bruise/bleed easily.  Psychiatric/Behavioral: Negative for dysphoric mood. The patient is not nervous/anxious.    Per HPI unless specifically indicated above    Objective:    BP 114/62  Pulse 68  Temp(Src) 98.2 F (36.8 C) (Oral)  Resp 16  Ht 5\' 2"  (1.575 m)  Wt 157 lb 4 oz (71.328 kg)  BMI 28.75 kg/m2  LMP 12/13/2013  Physical Exam  Nursing note and vitals reviewed. Constitutional: She is oriented to person, place, and time. She appears well-developed and well-nourished. No distress.  HENT:  Head: Normocephalic and atraumatic.  Right Ear: Hearing, tympanic membrane, external ear and ear canal normal.  Left Ear: Hearing, tympanic membrane, external ear and ear canal normal.  Nose: Nose normal.  Mouth/Throat: Uvula is midline, oropharynx is clear and moist and mucous membranes are normal. No oropharyngeal exudate, posterior oropharyngeal edema or posterior oropharyngeal erythema.  Eyes: Conjunctivae and EOM are normal. Pupils are equal, round, and reactive to light. No scleral icterus.  Neck: Normal range of motion. Neck supple. No thyromegaly present.  Cardiovascular: Normal rate, regular rhythm, normal heart sounds and intact distal pulses.   No murmur heard. Pulses:      Radial pulses are 2+ on the right side, and 2+ on the left side.  Pulmonary/Chest: Effort normal and breath sounds normal. No respiratory distress. She has no wheezes. She has no rales.  Abdominal: Soft. Bowel sounds are normal. She exhibits no distension and no mass. There is no tenderness. There is no rebound and no guarding.  Musculoskeletal: Normal range of motion. She exhibits no edema.  Lymphadenopathy:    She has no cervical adenopathy.  Neurological: She is alert and  oriented to person, place, and time.  CN grossly intact, station and gait intact  Skin: Skin is warm and dry. No rash noted.  Psychiatric: She has a normal mood and affect. Her behavior is normal. Judgment and thought content normal.        Assessment & Plan:   Problem List Items Addressed This Visit   Health care maintenance - Primary     Preventative protocols reviewed and updated unless pt declined. Discussed healthy diet and lifestyle. PPD placed today, flu shot today. Titers to be drawn when pt returns for fasting labwork.    Relevant Orders      Lipid panel      Basic metabolic panel      TSH    Other Visit Diagnoses   Incomplete immunization status        Relevant Orders       Hepatitis B surface antibody       Rubeola antibody IgG       Mumps antibody, IgG       Rubella screen       Varicella zoster antibody, IgG        Follow up plan: Return in about 1 year (around 12/15/2014), or as needed, for annual exam.

## 2013-12-15 ENCOUNTER — Other Ambulatory Visit (INDEPENDENT_AMBULATORY_CARE_PROVIDER_SITE_OTHER): Payer: Federal, State, Local not specified - PPO

## 2013-12-15 DIAGNOSIS — Z0000 Encounter for general adult medical examination without abnormal findings: Secondary | ICD-10-CM

## 2013-12-15 DIAGNOSIS — Z2839 Other underimmunization status: Secondary | ICD-10-CM

## 2013-12-15 DIAGNOSIS — Z9289 Personal history of other medical treatment: Secondary | ICD-10-CM

## 2013-12-15 DIAGNOSIS — Z9189 Other specified personal risk factors, not elsewhere classified: Principal | ICD-10-CM

## 2013-12-15 DIAGNOSIS — R5383 Other fatigue: Secondary | ICD-10-CM

## 2013-12-15 LAB — LIPID PANEL
Cholesterol: 146 mg/dL (ref 0–200)
HDL: 39.8 mg/dL (ref >39.00)
LDL Cholesterol: 98 mg/dL (ref 0–99)
NonHDL: 106.2
Total CHOL/HDL Ratio: 4
Triglycerides: 39 mg/dL (ref 0.0–149.0)
VLDL: 7.8 mg/dL (ref 0.0–40.0)

## 2013-12-15 LAB — BASIC METABOLIC PANEL
BUN: 12 mg/dL (ref 6–23)
CO2: 26 mEq/L (ref 19–32)
Calcium: 8.8 mg/dL (ref 8.4–10.5)
Chloride: 108 mEq/L (ref 96–112)
Creatinine, Ser: 0.7 mg/dL (ref 0.4–1.2)
GFR: 97.75 mL/min (ref >60.00)
Glucose, Bld: 99 mg/dL (ref 70–99)
Potassium: 3.8 mEq/L (ref 3.5–5.1)
Sodium: 140 mEq/L (ref 135–145)

## 2013-12-15 LAB — TSH: TSH: 1.48 u[IU]/mL (ref 0.35–4.50)

## 2013-12-16 LAB — VARICELLA ZOSTER ANTIBODY, IGG: Varicella IgG: 1230 Index — ABNORMAL HIGH (ref <135.00)

## 2013-12-16 LAB — RUBEOLA ANTIBODY IGG: Rubeola IgG: 37.6 AU/mL — ABNORMAL HIGH (ref <25.00)

## 2013-12-16 LAB — MUMPS ANTIBODY, IGG: Mumps IgG: <5 AU/mL (ref <9.00)

## 2013-12-16 LAB — RUBELLA SCREEN: Rubella: 3.34 Index — ABNORMAL HIGH (ref <0.90)

## 2013-12-16 LAB — HEPATITIS B SURFACE ANTIBODY,QUALITATIVE: Hep B S Ab: NEGATIVE

## 2013-12-17 LAB — TB SKIN TEST
Induration: 0 mm
TB Skin Test: NEGATIVE

## 2013-12-17 NOTE — Addendum Note (Signed)
Addended by: Royann Shivers A on: 12/17/2013 08:29 AM   Modules accepted: Orders

## 2014-02-01 ENCOUNTER — Encounter: Payer: Self-pay | Admitting: Family Medicine

## 2014-10-28 DIAGNOSIS — J4520 Mild intermittent asthma, uncomplicated: Secondary | ICD-10-CM

## 2014-10-28 DIAGNOSIS — J309 Allergic rhinitis, unspecified: Secondary | ICD-10-CM | POA: Insufficient documentation

## 2014-10-28 DIAGNOSIS — Z91018 Allergy to other foods: Secondary | ICD-10-CM

## 2014-10-28 DIAGNOSIS — T781XXS Other adverse food reactions, not elsewhere classified, sequela: Secondary | ICD-10-CM

## 2014-10-28 DIAGNOSIS — T781XXA Other adverse food reactions, not elsewhere classified, initial encounter: Secondary | ICD-10-CM | POA: Insufficient documentation

## 2014-10-28 DIAGNOSIS — T7819XA Other adverse food reactions, not elsewhere classified, initial encounter: Secondary | ICD-10-CM | POA: Insufficient documentation

## 2014-11-25 ENCOUNTER — Ambulatory Visit (INDEPENDENT_AMBULATORY_CARE_PROVIDER_SITE_OTHER): Payer: Federal, State, Local not specified - PPO

## 2014-11-25 DIAGNOSIS — J309 Allergic rhinitis, unspecified: Secondary | ICD-10-CM

## 2014-12-01 ENCOUNTER — Ambulatory Visit (INDEPENDENT_AMBULATORY_CARE_PROVIDER_SITE_OTHER): Payer: Federal, State, Local not specified - PPO

## 2014-12-01 DIAGNOSIS — J301 Allergic rhinitis due to pollen: Secondary | ICD-10-CM | POA: Diagnosis not present

## 2014-12-22 ENCOUNTER — Ambulatory Visit (INDEPENDENT_AMBULATORY_CARE_PROVIDER_SITE_OTHER): Payer: Federal, State, Local not specified - PPO | Admitting: Neurology

## 2014-12-22 DIAGNOSIS — J309 Allergic rhinitis, unspecified: Secondary | ICD-10-CM | POA: Diagnosis not present

## 2015-02-04 ENCOUNTER — Encounter: Payer: Federal, State, Local not specified - PPO | Admitting: *Deleted

## 2015-02-04 ENCOUNTER — Ambulatory Visit (INDEPENDENT_AMBULATORY_CARE_PROVIDER_SITE_OTHER): Payer: Federal, State, Local not specified - PPO | Admitting: *Deleted

## 2015-02-04 DIAGNOSIS — J309 Allergic rhinitis, unspecified: Secondary | ICD-10-CM | POA: Diagnosis not present

## 2015-02-04 NOTE — Progress Notes (Signed)
This encounter was created in error - please disregard.

## 2015-02-11 ENCOUNTER — Ambulatory Visit (INDEPENDENT_AMBULATORY_CARE_PROVIDER_SITE_OTHER): Payer: Federal, State, Local not specified - PPO | Admitting: *Deleted

## 2015-02-11 DIAGNOSIS — J309 Allergic rhinitis, unspecified: Secondary | ICD-10-CM

## 2015-02-25 ENCOUNTER — Ambulatory Visit (INDEPENDENT_AMBULATORY_CARE_PROVIDER_SITE_OTHER): Payer: Federal, State, Local not specified - PPO | Admitting: Neurology

## 2015-02-25 DIAGNOSIS — J309 Allergic rhinitis, unspecified: Secondary | ICD-10-CM | POA: Diagnosis not present

## 2015-03-11 ENCOUNTER — Ambulatory Visit (INDEPENDENT_AMBULATORY_CARE_PROVIDER_SITE_OTHER): Payer: Federal, State, Local not specified - PPO

## 2015-03-11 DIAGNOSIS — J309 Allergic rhinitis, unspecified: Secondary | ICD-10-CM | POA: Diagnosis not present

## 2015-03-17 DIAGNOSIS — J301 Allergic rhinitis due to pollen: Secondary | ICD-10-CM | POA: Diagnosis not present

## 2015-03-18 DIAGNOSIS — J3089 Other allergic rhinitis: Secondary | ICD-10-CM | POA: Diagnosis not present

## 2015-03-24 ENCOUNTER — Ambulatory Visit (INDEPENDENT_AMBULATORY_CARE_PROVIDER_SITE_OTHER): Payer: Federal, State, Local not specified - PPO

## 2015-03-24 DIAGNOSIS — J309 Allergic rhinitis, unspecified: Secondary | ICD-10-CM

## 2015-09-21 ENCOUNTER — Ambulatory Visit (INDEPENDENT_AMBULATORY_CARE_PROVIDER_SITE_OTHER): Payer: Federal, State, Local not specified - PPO | Admitting: Family Medicine

## 2015-09-21 ENCOUNTER — Encounter: Payer: Self-pay | Admitting: Family Medicine

## 2015-09-21 DIAGNOSIS — K219 Gastro-esophageal reflux disease without esophagitis: Secondary | ICD-10-CM | POA: Diagnosis not present

## 2015-09-21 MED ORDER — OMEPRAZOLE 40 MG PO CPDR: 40.0000 mg | DELAYED_RELEASE_CAPSULE | Freq: Every day | ORAL | 2 refills | Status: DC

## 2015-09-21 NOTE — Assessment & Plan Note (Addendum)
Anticipate uncomplicated GERD. Treat with omeprazole 40mg  daily x 3 wks then PRN. Then may use pepcid or zantac nightly. Discussed tums use.  Discussed GERD diet. Update if not improved with treatment, would consider H pylori testing.

## 2015-09-21 NOTE — Progress Notes (Signed)
BP 118/74   Pulse 76   Temp 98.2 F (36.8 C) (Oral)   Wt 177 lb 8 oz (80.5 kg)   LMP 09/09/2015   BMI 32.47 kg/m    CC: chest pain/burning after meal Subjective:    Patient ID: Sabrina Smith, female    DOB: 04/28/1980, 35 y.o.   MRN: [id number]  HPI: Sabrina Smith is a 35 y.o. female presenting on 09/21/2015 for Other (burning in chest after meals x 1 year)   Last seen here 11/2013.   1 yr h/o burning in chest after certain foods (salads, tomatoes). Some regurgitation of food.   Tums help (5-6 at a time). Has tried prilosec OTC 20mg  for a few days which helped as well.   Mother also has GERD.   No fevers/chills, dysphagia, early satiety, nausea/vomiting, bowel changes.   1 glass of wine twice weekly. Ex-smoker.  1 cup coffee daily.  No NSAIDs.   Relevant past medical, surgical, family and social history reviewed and updated as indicated. Interim medical history since our last visit reviewed. Allergies and medications reviewed and updated. Current Outpatient Prescriptions on File Prior to Visit  Medication Sig  . Albuterol Sulfate (PROAIR HFA IN) Inhale into the lungs every 4 (four) hours as needed.   Marland Kitchen Fexofenadine-Pseudoephedrine (ALLEGRA-D 12 HOUR PO) Take by mouth every 12 (twelve) hours as needed.  . fluticasone (FLONASE) 50 MCG/ACT nasal spray Place 2 sprays into both nostrils daily.  . montelukast (SINGULAIR) 10 MG tablet Take 10 mg by mouth 1 day or 1 dose.  . diphenhydrAMINE (BENADRYL) 25 mg capsule Take 25 mg by mouth every 4 (four) hours as needed.  Marland Kitchen EPINEPHrine (EPIPEN 2-PAK) 0.3 mg/0.3 mL IJ SOAJ injection Inject 0.3 mg into the muscle once.   No current facility-administered medications on file prior to visit.     Review of Systems Per HPI unless specifically indicated in ROS section     Objective:    BP 118/74   Pulse 76   Temp 98.2 F (36.8 C) (Oral)   Wt 177 lb 8 oz (80.5 kg)   LMP 09/09/2015   BMI 32.47 kg/m   Wt Readings from Last  3 Encounters:  09/21/15 177 lb 8 oz (80.5 kg)  12/14/13 157 lb 4 oz (71.3 kg)  01/13/13 147 lb (66.7 kg)    Physical Exam  Constitutional: She appears well-developed and well-nourished. No distress.  HENT:  Mouth/Throat: Oropharynx is clear and moist. No oropharyngeal exudate.  Cardiovascular: Normal rate, regular rhythm, normal heart sounds and intact distal pulses.   No murmur heard. Pulmonary/Chest: Effort normal and breath sounds normal. No respiratory distress. She has no wheezes. She has no rales.  Abdominal: Soft. Normal appearance and bowel sounds are normal. She exhibits no distension and no mass. There is no hepatosplenomegaly. There is no tenderness. There is no rebound, no guarding and no CVA tenderness.  Musculoskeletal: She exhibits no edema.  Skin: Skin is warm and dry. No rash noted.  Psychiatric: She has a normal mood and affect.  Nursing note and vitals reviewed.     Assessment & Plan:   Problem List Items Addressed This Visit    GERD (gastroesophageal reflux disease)    Anticipate uncomplicated GERD. Treat with omeprazole 40mg  daily x 3 wks then PRN. Then may use pepcid or zantac nightly. Discussed tums use.  Discussed GERD diet. Update if not improved with treatment, would consider H pylori testing.      Relevant Medications  omeprazole (PRILOSEC) 40 MG capsule    Other Visit Diagnoses   None.      Follow up plan: Return if symptoms worsen or fail to improve.  Ria Bush, MD

## 2015-09-21 NOTE — Patient Instructions (Signed)
This does sound like heartburn. Treat with omeprazole 40mg  daily for 3 weeks then as needed.  Head of bed elevated. Avoidance of citrus, fatty foods, chocolate, peppermint, and excessive alcohol, along with sodas, orange juice (acidic drinks) At least a few hours between dinner and bed, minimize naps after eating. Let us know if not improving with treatment.    Gastroesophageal Reflux Disease, Adult Normally, food travels down the esophagus and stays in the stomach to be digested. However, when a person has gastroesophageal reflux disease (GERD), food and stomach acid move back up into the esophagus. When this happens, the esophagus becomes sore and inflamed. Over time, GERD can create small holes (ulcers) in the lining of the esophagus.  CAUSES This condition is caused by a problem with the muscle between the esophagus and the stomach (lower esophageal sphincter, or LES). Normally, the LES muscle closes after food passes through the esophagus to the stomach. When the LES is weakened or abnormal, it does not close properly, and that allows food and stomach acid to go back up into the esophagus. The LES can be weakened by certain dietary substances, medicines, and medical conditions, including:  Tobacco use.  Pregnancy.  Having a hiatal hernia.  Heavy alcohol use.  Certain foods and beverages, such as coffee, chocolate, onions, and peppermint. RISK FACTORS This condition is more likely to develop in:  People who have an increased body weight.  People who have connective tissue disorders.  People who use NSAID medicines. SYMPTOMS Symptoms of this condition include:  Heartburn.  Difficult or painful swallowing.  The feeling of having a lump in the throat.  Abitter taste in the mouth.  Bad breath.  Having a large amount of saliva.  Having an upset or bloated stomach.  Belching.  Chest pain.  Shortness of breath or wheezing.  Ongoing (chronic) cough or a night-time  cough.  Wearing away of tooth enamel.  Weight loss. Different conditions can cause chest pain. Make sure to see your health care provider if you experience chest pain. DIAGNOSIS Your health care provider will take a medical history and perform a physical exam. To determine if you have mild or severe GERD, your health care provider may also monitor how you respond to treatment. You may also have other tests, including:  An endoscopy toexamine your stomach and esophagus with a small camera.  A test thatmeasures the acidity level in your esophagus.  A test thatmeasures how much pressure is on your esophagus.  A barium swallow or modified barium swallow to show the shape, size, and functioning of your esophagus. TREATMENT The goal of treatment is to help relieve your symptoms and to prevent complications. Treatment for this condition may vary depending on how severe your symptoms are. Your health care provider may recommend:  Changes to your diet.  Medicine.  Surgery. HOME CARE INSTRUCTIONS Diet  Follow a diet as recommended by your health care provider. This may involve avoiding foods and drinks such as:  Coffee and tea (with or without caffeine).  Drinks that containalcohol.  Energy drinks and sports drinks.  Carbonated drinks or sodas.  Chocolate and cocoa.  Peppermint and mint flavorings.  Garlic and onions.  Horseradish.  Spicy and acidic foods, including peppers, chili powder, curry powder, vinegar, hot sauces, and barbecue sauce.  Citrus fruit juices and citrus fruits, such as oranges, lemons, and limes.  Tomato-based foods, such as red sauce, chili, salsa, and pizza with red sauce.  Fried and fatty foods, such as  donuts, french fries, potato chips, and high-fat dressings.  High-fat meats, such as hot dogs and fatty cuts of red and white meats, such as rib eye steak, sausage, ham, and bacon.  High-fat dairy items, such as whole milk, butter, and cream  cheese.  Eat small, frequent meals instead of large meals.  Avoid drinking large amounts of liquid with your meals.  Avoid eating meals during the 2-3 hours before bedtime.  Avoid lying down right after you eat.  Do not exercise right after you eat. General Instructions  Pay attention to any changes in your symptoms.  Take over-the-counter and prescription medicines only as told by your health care provider. Do not take aspirin, ibuprofen, or other NSAIDs unless your health care provider told you to do so.  Do not use any tobacco products, including cigarettes, chewing tobacco, and e-cigarettes. If you need help quitting, ask your health care provider.  Wear loose-fitting clothing. Do not wear anything tight around your waist that causes pressure on your abdomen.  Raise (elevate) the head of your bed 6 inches (15cm).  Try to reduce your stress, such as with yoga or meditation. If you need help reducing stress, ask your health care provider.  If you are overweight, reduce your weight to an amount that is healthy for you. Ask your health care provider for guidance about a safe weight loss goal.  Keep all follow-up visits as told by your health care provider. This is important. SEEK MEDICAL CARE IF:  You have new symptoms.  You have unexplained weight loss.  You have difficulty swallowing, or it hurts to swallow.  You have wheezing or a persistent cough.  Your symptoms do not improve with treatment.  You have a hoarse voice. SEEK IMMEDIATE MEDICAL CARE IF:  You have pain in your arms, neck, jaw, teeth, or back.  You feel sweaty, dizzy, or light-headed.  You have chest pain or shortness of breath.  You vomit and your vomit looks like blood or coffee grounds.  You faint.  Your stool is bloody or black.  You cannot swallow, drink, or eat.   This information is not intended to replace advice given to you by your health care provider. Make sure you discuss any  questions you have with your health care provider.   Document Released: 11/22/2004 Document Revised: 11/03/2014 Document Reviewed: 06/09/2014 Elsevier Interactive Patient Education [employer].

## 2015-11-04 ENCOUNTER — Ambulatory Visit: Payer: Federal, State, Local not specified - PPO | Admitting: Family Medicine

## 2015-11-04 DIAGNOSIS — Z0289 Encounter for other administrative examinations: Secondary | ICD-10-CM

## 2016-02-06 ENCOUNTER — Encounter: Payer: Self-pay | Admitting: Internal Medicine

## 2016-02-06 ENCOUNTER — Ambulatory Visit (INDEPENDENT_AMBULATORY_CARE_PROVIDER_SITE_OTHER): Payer: Federal, State, Local not specified - PPO | Admitting: Internal Medicine

## 2016-02-06 DIAGNOSIS — R490 Dysphonia: Secondary | ICD-10-CM

## 2016-02-06 DIAGNOSIS — J069 Acute upper respiratory infection, unspecified: Secondary | ICD-10-CM | POA: Diagnosis not present

## 2016-02-06 LAB — POCT RAPID STREP A (OFFICE): Rapid Strep A Screen: NEGATIVE

## 2016-02-06 MED ORDER — AZITHROMYCIN 250 MG PO TABS: ORAL_TABLET | ORAL | 0 refills | Status: DC

## 2016-02-06 NOTE — Assessment & Plan Note (Signed)
Strep test (-) Zpac if worse 

## 2016-02-06 NOTE — Progress Notes (Signed)
Subjective:  Patient ID: Sabrina Smith, female    DOB: April 28, 1980  Age: 35 y.o. MRN: [id number]  CC: Hoarse (No pain or sore throat, just "raspy")   HPI Sabrina Smith presents for URI sx's since Sat  Outpatient Medications Prior to Visit  Medication Sig Dispense Refill  . Albuterol Sulfate (PROAIR HFA IN) Inhale into the lungs every 4 (four) hours as needed.     Marland Kitchen Fexofenadine-Pseudoephedrine (ALLEGRA-D 12 HOUR PO) Take by mouth every 12 (twelve) hours as needed.    . fluticasone (FLONASE) 50 MCG/ACT nasal spray Place 2 sprays into both nostrils daily.    . montelukast (SINGULAIR) 10 MG tablet Take 10 mg by mouth 1 day or 1 dose.    Marland Kitchen omeprazole (PRILOSEC) 40 MG capsule Take 1 capsule (40 mg total) by mouth daily. 30 capsule 2  . diphenhydrAMINE (BENADRYL) 25 mg capsule Take 25 mg by mouth every 4 (four) hours as needed.    Marland Kitchen EPINEPHrine (EPIPEN 2-PAK) 0.3 mg/0.3 mL IJ SOAJ injection Inject 0.3 mg into the muscle once.     No facility-administered medications prior to visit.     ROS Review of Systems  Constitutional: Negative for activity change, appetite change, chills, fatigue and unexpected weight change.  HENT: Positive for congestion, sore throat and trouble swallowing. Negative for mouth sores, rhinorrhea and sinus pressure.   Eyes: Negative for visual disturbance.  Respiratory: Negative for cough and chest tightness.   Gastrointestinal: Negative for abdominal pain and nausea.  Genitourinary: Negative for difficulty urinating, frequency and vaginal pain.  Musculoskeletal: Negative for back pain and gait problem.  Skin: Negative for pallor and rash.  Neurological: Negative for dizziness, tremors, weakness, numbness and headaches.  Psychiatric/Behavioral: Negative for confusion and sleep disturbance.    Objective:  BP 90/64   Pulse 73   Temp 98.2 F (36.8 C) (Oral)   Wt 168 lb (76.2 kg)   SpO2 98%   BMI 30.73 kg/m   BP Readings from Last 3 Encounters:    02/06/16 90/64  09/21/15 118/74  12/14/13 114/62    Wt Readings from Last 3 Encounters:  02/06/16 168 lb (76.2 kg)  09/21/15 177 lb 8 oz (80.5 kg)  12/14/13 157 lb 4 oz (71.3 kg)    Physical Exam  Constitutional: She appears well-developed. No distress.  HENT:  Head: Normocephalic.  Right Ear: External ear normal.  Left Ear: External ear normal.  Nose: Nose normal.  Mouth/Throat: Oropharynx is clear and moist.  Eyes: Conjunctivae are normal. Pupils are equal, round, and reactive to light. Right eye exhibits no discharge. Left eye exhibits no discharge.  Neck: Normal range of motion. Neck supple. No JVD present. No tracheal deviation present. No thyromegaly present.  Cardiovascular: Normal rate, regular rhythm and normal heart sounds.   Pulmonary/Chest: No stridor. No respiratory distress. She has no wheezes.  Abdominal: Soft. Bowel sounds are normal. She exhibits no distension and no mass. There is no tenderness. There is no rebound and no guarding.  Musculoskeletal: She exhibits no edema or tenderness.  Lymphadenopathy:    She has no cervical adenopathy.  Neurological: She displays normal reflexes. No cranial nerve deficit. She exhibits normal muscle tone. Coordination normal.  Skin: No rash noted. No erythema.  Psychiatric: She has a normal mood and affect. Her behavior is normal. Judgment and thought content normal.  eryth throat  Lab Results  Component Value Date   WBC 7.8 12/08/2012   HGB 13.6 12/08/2012   HCT 41.1 12/08/2012  PLT 239.0 12/08/2012   GLUCOSE 99 12/15/2013   CHOL 146 12/15/2013   TRIG 39.0 12/15/2013   HDL 39.80 12/15/2013   LDLCALC 98 12/15/2013   ALT 17 12/08/2012   AST 18 12/08/2012   NA 140 12/15/2013   K 3.8 12/15/2013   CL 108 12/15/2013   CREATININE 0.7 12/15/2013   BUN 12 12/15/2013   CO2 26 12/15/2013   TSH 1.48 12/15/2013    US Pelvis Complete   COMMENTS: PROCEDURE:     KUS - KUS PELVIS NON-OB W/TRANSVAGINAL  - Jul 17 2010   3:37PM RESULT:     Transabdominal and endovaginal ultrasound was performed. The uterus measures 8.59 cm x 5.62 cm x 5.17 cm. The endometrium measures 1.9 mm in thickness. No uterine mass is seen. The right and left ovaries are visualized. The right ovary measures 2 cm at maximum diameter and the left ovary measures 2.2 cm at maximum diameter. Vascular flow is seen in each ovary on Doppler examination. No torsion is evident. No abnormal adnexal masses are identified. A few follicles are noted in each ovary. There is a nonspecific trace of free fluid in the pelvis. The visualized portion of the urinary bladder is normal in appearance. The kidneys are visualized bilaterally and show no hydronephrosis. IMPRESSION:     No significant abnormalities are identified. Thank you for this opportunity to contribute to the care of your patient.     Assessment & Plan:   There are no diagnoses linked to this encounter. I am having Ms. Boss maintain her montelukast, Fexofenadine-Pseudoephedrine (ALLEGRA-D 12 HOUR PO), fluticasone, Albuterol Sulfate (PROAIR HFA IN), EPINEPHrine, diphenhydrAMINE, and omeprazole.  No orders of the defined types were placed in this encounter.    Follow-up: No Follow-up on file.  Walker Kehr, MD

## 2016-02-06 NOTE — Progress Notes (Signed)
Pre visit review using our clinic review tool, if applicable. No additional management support is needed unless otherwise documented below in the visit note. 

## 2016-02-06 NOTE — Patient Instructions (Signed)
Use over-the-counter  "cold" medicines  such as "Tylenol cold" , "Advil cold",  "Mucinex" or" Mucinex D"  for cough and congestion.   Avoid decongestants if you have high blood pressure and use "Afrin" nasal spray for nasal congestion as directed instead. Use" Delsym" or" Robitussin" cough syrup varietis for cough.  You can use plain "Tylenol" or "Advil" for fever, chills and achyness. Use Halls or Ricola cough drops.   "Common cold" symptoms are usually triggered by a virus.  The antibiotics are usually not necessary. On average, a" viral cold" illness would take 4-7 days to resolve. Please, make an appointment if you are not better or if you're worse.   Take Zpac if worse

## 2016-06-11 NOTE — Addendum Note (Signed)
Addended by: Felipa Emory on: 06/11/2016 03:06 PM   Modules accepted: Orders

## 2016-08-07 ENCOUNTER — Ambulatory Visit: Payer: Federal, State, Local not specified - PPO | Admitting: Obstetrics & Gynecology

## 2016-08-20 ENCOUNTER — Encounter: Payer: Self-pay | Admitting: Obstetrics & Gynecology

## 2016-08-20 ENCOUNTER — Ambulatory Visit (INDEPENDENT_AMBULATORY_CARE_PROVIDER_SITE_OTHER): Payer: Federal, State, Local not specified - PPO | Admitting: Obstetrics & Gynecology

## 2016-08-20 VITALS — BP 110/60 | HR 57 | Ht 62.0 in | Wt 174.0 lb

## 2016-08-20 DIAGNOSIS — Z01419 Encounter for gynecological examination (general) (routine) without abnormal findings: Secondary | ICD-10-CM | POA: Diagnosis not present

## 2016-08-20 DIAGNOSIS — Z30431 Encounter for routine checking of intrauterine contraceptive device: Secondary | ICD-10-CM

## 2016-08-20 DIAGNOSIS — Z124 Encounter for screening for malignant neoplasm of cervix: Secondary | ICD-10-CM

## 2016-08-20 DIAGNOSIS — Z0000 Encounter for general adult medical examination without abnormal findings: Secondary | ICD-10-CM

## 2016-08-20 MED ORDER — METRONIDAZOLE 500 MG PO TABS: 500.0000 mg | ORAL_TABLET | ORAL | 6 refills | Status: AC | PRN

## 2016-08-20 NOTE — Progress Notes (Signed)
HPI:      Sabrina Smith is a 36 y.o. G2P1011 who LMP was Patient's last menstrual period was 08/05/2016., she presents today for her annual examination. The patient has no complaints today. The patient is sexually active. Her last pap: approximate date 2015 and was normal. The patient does perform self breast exams.  There is notable family history of breast or ovarian cancer in her family.  The patient has regular exercise: yes.  The patient denies current symptoms of depression.    GYN History: Contraception: IUD  PMHx: Past Medical History:  Diagnosis Date  . Allergic rhinitis    severe ([place])  . History of chicken pox   . Shingles 1990s   Past Surgical History:  Procedure Laterality Date  . NO PAST SURGERIES     Family History  Problem Relation Age of Onset  . Diabetes Maternal Aunt   . Hypertension Maternal Aunt   . Diabetes Maternal Uncle   . Hypertension Maternal Uncle   . Stroke Maternal Uncle   . Heart disease Maternal Grandmother   . Diabetes Maternal Grandfather   . Cancer Maternal Grandfather        prostate   Social History  Substance Use Topics  . Smoking status: Former Smoker    Quit date: 09/27/2010  . Smokeless tobacco: Never Used  . Alcohol use Yes     Comment: Occasional    Current Outpatient Prescriptions:  .  Albuterol Sulfate (PROAIR HFA IN), Inhale into the lungs every 4 (four) hours as needed. , Disp: , Rfl:  .  azithromycin (ZITHROMAX Z-PAK) 250 MG tablet, As directed, Disp: 6 each, Rfl: 0 .  diphenhydrAMINE (BENADRYL) 25 mg capsule, Take 25 mg by mouth every 4 (four) hours as needed., Disp: , Rfl:  .  EPINEPHrine (EPIPEN 2-PAK) 0.3 mg/0.3 mL IJ SOAJ injection, Inject 0.3 mg into the muscle once., Disp: , Rfl:  .  Fexofenadine-Pseudoephedrine (ALLEGRA-D 12 HOUR PO), Take by mouth every 12 (twelve) hours as needed., Disp: , Rfl:  .  fluticasone (FLONASE) 50 MCG/ACT nasal spray, Place 2 sprays into both nostrils daily., Disp: , Rfl:    .  metroNIDAZOLE (FLAGYL) 500 MG tablet, Take 1 tablet (500 mg total) by mouth as needed. With trigger for BV (cycle, sweating episodes)., Disp: 10 tablet, Rfl: 6 .  montelukast (SINGULAIR) 10 MG tablet, Take 10 mg by mouth 1 day or 1 dose., Disp: , Rfl:  .  omeprazole (PRILOSEC) 40 MG capsule, Take 1 capsule (40 mg total) by mouth daily., Disp: 30 capsule, Rfl: 2 Allergies: Other and Sulfa drugs cross reactors  Review of Systems  Constitutional: Negative for chills, fever and malaise/fatigue.  HENT: Negative for congestion, sinus pain and sore throat.   Eyes: Negative for blurred vision and pain.  Respiratory: Negative for cough and wheezing.   Cardiovascular: Negative for chest pain and leg swelling.  Gastrointestinal: Negative for abdominal pain, constipation, diarrhea, heartburn, nausea and vomiting.  Genitourinary: Negative for dysuria, frequency, hematuria and urgency.  Musculoskeletal: Negative for back pain, joint pain, myalgias and neck pain.  Skin: Negative for itching and rash.  Neurological: Negative for dizziness, tremors and weakness.  Endo/Heme/Allergies: Does not bruise/bleed easily.  Psychiatric/Behavioral: Negative for depression. The patient is not nervous/anxious and does not have insomnia.     Objective: BP 110/60   Pulse (!) 57   Ht 5\' 2"  (1.575 m)   Wt 174 lb (78.9 kg)   LMP 08/05/2016   BMI 31.83  kg/m   Filed Weights   08/20/16 1600  Weight: 174 lb (78.9 kg)   Body mass index is 31.83 kg/m. Physical Exam  Constitutional: She is oriented to person, place, and time. She appears well-developed and well-nourished. No distress.  Genitourinary: Rectum normal, vagina normal and uterus normal. Pelvic exam was performed with patient supine. There is no rash or lesion on the right labia. There is no rash or lesion on the left labia. Vagina exhibits no lesion. No bleeding in the vagina. Right adnexum does not display mass and does not display tenderness. Left adnexum  does not display mass and does not display tenderness. Cervix does not exhibit motion tenderness, lesion, friability or polyp.   Uterus is mobile and midaxial. Uterus is not enlarged or exhibiting a mass.  HENT:  Head: Normocephalic and atraumatic. Head is without laceration.  Right Ear: Hearing normal.  Left Ear: Hearing normal.  Nose: No epistaxis.  No foreign bodies.  Mouth/Throat: Uvula is midline, oropharynx is clear and moist and mucous membranes are normal.  Eyes: Pupils are equal, round, and reactive to light.  Neck: Normal range of motion. Neck supple. No thyromegaly present.  Cardiovascular: Normal rate and regular rhythm.  Exam reveals no gallop and no friction rub.   No murmur heard. Pulmonary/Chest: Effort normal and breath sounds normal. No respiratory distress. She has no wheezes. Right breast exhibits no mass, no skin change and no tenderness. Left breast exhibits no mass, no skin change and no tenderness.  Abdominal: Soft. Bowel sounds are normal. She exhibits no distension. There is no tenderness. There is no rebound.  Musculoskeletal: Normal range of motion.  Neurological: She is alert and oriented to person, place, and time. No cranial nerve deficit.  Skin: Skin is warm and dry.  Psychiatric: She has a normal mood and affect. Judgment normal.  Vitals reviewed.   Assessment:  ANNUAL EXAM 1. Annual physical exam   2. Screening for cervical cancer   3. Encounter for management of intrauterine contraceptive device (IUD), unspecified IUD management type      Screening Plan:            1.  Cervical Screening-  Pap smear done today  2. Breast screening- Exam annually and mammogram>40 planned   3. Colonoscopy every 10 years, Hemoccult testing - after age 39  4. Labs managed by PCP  5. Counseling for contraception: desires IUD out and then monitoring for periods and weight.  May consider another IUD but not now.  Other:  1. Annual physical exam  2. Screening  for cervical cancer - IGP, Aptima HPV  3. Encounter for management of intrauterine contraceptive device (IUD), unspecified IUD management type Birth Control I discussed multiple birth control options and methods with the patient.  The risks and benefits of each were reviewed.  The possible side effects including deep venous thrombosis, breast tenderness, fluid retention, mood changes and abnormal vaginal bleeding were discussed.  Combination as well as progesterone-only options, pros and cons counseled.  4. Chronic sx's of BV.  Plan Flagyl w trigger (cycle, sweating episodes she has a lot of).    F/U  Return in about 1 year (around 08/20/2017) for Annual.  Barnett Applebaum, MD, [facility], [facility] 08/20/2016  4:25 PM

## 2016-08-20 NOTE — Patient Instructions (Signed)
Contraception Choices Contraception (birth control) is the use of any methods or devices to prevent pregnancy. Below are some methods to help avoid pregnancy. Hormonal methods  Contraceptive implant. This is a thin, plastic tube containing progesterone hormone. It does not contain estrogen hormone. Your health care provider inserts the tube in the inner part of the upper arm. The tube can remain in place for up to 3 years. After 3 years, the implant must be removed. The implant prevents the ovaries from releasing an egg (ovulation), thickens the cervical mucus to prevent sperm from entering the uterus, and thins the lining of the inside of the uterus.  Progesterone-only injections. These injections are given every 3 months by your health care provider to prevent pregnancy. This synthetic progesterone hormone stops the ovaries from releasing eggs. It also thickens cervical mucus and changes the uterine lining. This makes it harder for sperm to survive in the uterus.  Birth control pills. These pills contain estrogen and progesterone hormone. They work by preventing the ovaries from releasing eggs (ovulation). They also cause the cervical mucus to thicken, preventing the sperm from entering the uterus. Birth control pills are prescribed by a health care provider.Birth control pills can also be used to treat heavy periods.  Minipill. This type of birth control pill contains only the progesterone hormone. They are taken every day of each month and must be prescribed by your health care provider.  Birth control patch. The patch contains hormones similar to those in birth control pills. It must be changed once a week and is prescribed by a health care provider.  Vaginal ring. The ring contains hormones similar to those in birth control pills. It is left in the vagina for 3 weeks, removed for 1 week, and then a new one is put back in place. The patient must be comfortable inserting and removing the ring from  the vagina.A health care provider's prescription is necessary.  Emergency contraception. Emergency contraceptives prevent pregnancy after unprotected sexual intercourse. This pill can be taken right after sex or up to 5 days after unprotected sex. It is most effective the sooner you take the pills after having sexual intercourse. Most emergency contraceptive pills are available without a prescription. Check with your pharmacist. Do not use emergency contraception as your only form of birth control. Barrier methods  Female condom. This is a thin sheath (latex or rubber) that is worn over the penis during sexual intercourse. It can be used with spermicide to increase effectiveness.  Female condom. This is a soft, loose-fitting sheath that is put into the vagina before sexual intercourse.  Diaphragm. This is a soft, latex, dome-shaped barrier that must be fitted by a health care provider. It is inserted into the vagina, along with a spermicidal jelly. It is inserted before intercourse. The diaphragm should be left in the vagina for 6 to 8 hours after intercourse.  Cervical cap. This is a round, soft, latex or plastic cup that fits over the cervix and must be fitted by a health care provider. The cap can be left in place for up to 48 hours after intercourse.  Sponge. This is a soft, circular piece of polyurethane foam. The sponge has spermicide in it. It is inserted into the vagina after wetting it and before sexual intercourse.  Spermicides. These are chemicals that kill or block sperm from entering the cervix and uterus. They come in the form of creams, jellies, suppositories, foam, or tablets. They do not require a prescription. They   are inserted into the vagina with an applicator before having sexual intercourse. The process must be repeated every time you have sexual intercourse. Intrauterine contraception  Intrauterine device (IUD). This is a T-shaped device that is put in a woman's uterus during  a menstrual period to prevent pregnancy. There are 2 types: ? Copper IUD. This type of IUD is wrapped in copper wire and is placed inside the uterus. Copper makes the uterus and fallopian tubes produce a fluid that kills sperm. It can stay in place for 10 years. ? Hormone IUD. This type of IUD contains the hormone progestin (synthetic progesterone). The hormone thickens the cervical mucus and prevents sperm from entering the uterus, and it also thins the uterine lining to prevent implantation of a fertilized egg. The hormone can weaken or kill the sperm that get into the uterus. It can stay in place for 3-5 years, depending on which type of IUD is used. Permanent methods of contraception  Female tubal ligation. This is when the woman's fallopian tubes are surgically sealed, tied, or blocked to prevent the egg from traveling to the uterus.  Hysteroscopic sterilization. This involves placing a small coil or insert into each fallopian tube. Your doctor uses a technique called hysteroscopy to do the procedure. The device causes scar tissue to form. This results in permanent blockage of the fallopian tubes, so the sperm cannot fertilize the egg. It takes about 3 months after the procedure for the tubes to become blocked. You must use another form of birth control for these 3 months.  Female sterilization. This is when the female has the tubes that carry sperm tied off (vasectomy).This blocks sperm from entering the vagina during sexual intercourse. After the procedure, the man can still ejaculate fluid (semen). Natural planning methods  Natural family planning. This is not having sexual intercourse or using a barrier method (condom, diaphragm, cervical cap) on days the woman could become pregnant.  Calendar method. This is keeping track of the length of each menstrual cycle and identifying when you are fertile.  Ovulation method. This is avoiding sexual intercourse during ovulation.  Symptothermal method.  This is avoiding sexual intercourse during ovulation, using a thermometer and ovulation symptoms.  Post-ovulation method. This is timing sexual intercourse after you have ovulated. Regardless of which type or method of contraception you choose, it is important that you use condoms to protect against the transmission of sexually transmitted infections (STIs). Talk with your health care provider about which form of contraception is most appropriate for you. This information is not intended to replace advice given to you by your health care provider. Make sure you discuss any questions you have with your health care provider. Document Released: 02/12/2005 Document Revised: 07/21/2015 Document Reviewed: 08/07/2012 Elsevier Interactive Patient Education  2017 [employer]  

## 2016-08-24 LAB — IGP, APTIMA HPV
HPV Aptima: NEGATIVE
PAP Smear Comment: 0

## 2016-10-30 ENCOUNTER — Emergency Department
Admission: EM | Admit: 2016-10-30 | Discharge: 2016-10-31 | Disposition: A | Discharge status: Home / Self Care | Payer: Federal, State, Local not specified - PPO | Attending: Emergency Medicine | Admitting: Emergency Medicine

## 2016-10-30 ENCOUNTER — Encounter: Payer: Self-pay | Admitting: Emergency Medicine

## 2016-10-30 ENCOUNTER — Emergency Department: Admission: EM | Admit: 2016-10-30 | Payer: Self-pay | Source: Home / Self Care

## 2016-10-30 DIAGNOSIS — R51 Headache: Secondary | ICD-10-CM | POA: Insufficient documentation

## 2016-10-30 DIAGNOSIS — J45909 Unspecified asthma, uncomplicated: Secondary | ICD-10-CM | POA: Insufficient documentation

## 2016-10-30 DIAGNOSIS — Z87891 Personal history of nicotine dependence: Secondary | ICD-10-CM | POA: Diagnosis not present

## 2016-10-30 DIAGNOSIS — Z79899 Other long term (current) drug therapy: Secondary | ICD-10-CM | POA: Diagnosis not present

## 2016-10-30 DIAGNOSIS — R519 Headache, unspecified: Secondary | ICD-10-CM

## 2016-10-30 NOTE — ED Triage Notes (Signed)
Patient to ER for c/o headache "all over". States she has headaches fairly frequently, but noticed this one has not gone away. Denies photosensitivity or h/o migraines.

## 2016-10-30 NOTE — ED Notes (Signed)
Pt reports a headache and high blood pressure for 1 day.  No hx htn.  No n/vd.  Frontal headache.  Pt took excedrin with some pain relief.  Pt alert.  Speech clear.

## 2016-10-31 ENCOUNTER — Emergency Department: Payer: Federal, State, Local not specified - PPO

## 2016-10-31 DIAGNOSIS — R51 Headache: Secondary | ICD-10-CM | POA: Diagnosis not present

## 2016-10-31 MED ORDER — BUTALBITAL-APAP-CAFFEINE 50-325-40 MG PO TABS
2.0000 | ORAL_TABLET | Freq: Once | ORAL | Status: AC
  Administered 2016-10-31: 2 via ORAL
  Filled 2016-10-31: qty 2

## 2016-10-31 MED ORDER — KETOROLAC TROMETHAMINE 60 MG/2ML IM SOLN
60.0000 mg | Freq: Once | INTRAMUSCULAR | Status: AC
  Administered 2016-10-31: 60 mg via INTRAMUSCULAR
  Filled 2016-10-31: qty 2

## 2016-10-31 MED ORDER — BUTALBITAL-APAP-CAFFEINE 50-325-40 MG PO TABS: 1.0000 | ORAL_TABLET | Freq: Four times a day (QID) | ORAL | 0 refills | Status: DC | PRN

## 2016-10-31 NOTE — Discharge Instructions (Signed)
PLease follow up with your primary care physician for further evaluation of your blood pressure

## 2016-10-31 NOTE — ED Provider Notes (Signed)
[facility] Department Provider Note   ____________________________________________   First MD Initiated Contact with Patient 10/31/16 0010     (approximate)  I have reviewed the triage vital signs and the nursing notes.   HISTORY  Chief Complaint Headache    HPI Sabrina Smith is a 35 y.o. female who comes into the hospital today with a headache all day. The patient reports that she checked her blood pressure and it was elevated. She felt some tingles in her arm earlier but reports that that is gone. The patient states her blood pressures 153/100. She took some Excedrin but reports that she still has some headache. She does not have a history of headaches. The patient states that her pain is currently a 5 out of 10 in intensity. She denies any sensitivity to light or sounds. She's had some nausea with no vomiting and denies any blurred vision. She states that the headache is a frontal headache and some pain in the back of her head too. When asked if she has neck pain and neck stiffness she says a little bit. The patient decided to come in to the hospital today for evaluation. She's not having any fevers.   Past Medical History:  Diagnosis Date  . Allergic rhinitis    severe ([place])  . History of chicken pox   . Shingles 1990s    Patient Active Problem List   Diagnosis Date Noted  . Acute upper respiratory infection 02/06/2016  . GERD (gastroesophageal reflux disease) 09/21/2015  . Allergic rhinitis 10/28/2014  . Asthma 10/28/2014  . Multiple food allergies 10/28/2014  . 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 Fatigue 12/08/2012  . Health care maintenance 04/11/2011    Past Surgical History:  Procedure Laterality Date  . NO PAST SURGERIES      Prior to Admission medications   Medication Sig Start Date End Date Taking? Authorizing Provider  Albuterol Sulfate (PROAIR HFA IN) Inhale into the lungs every 4 (four) hours as needed.      [provider]  azithromycin (ZITHROMAX Z-PAK) 250 MG tablet As directed 02/06/16   Plotnikov, Evie Lacks, MD  butalbital-acetaminophen-caffeine (FIORICET, ESGIC) [phone] MG tablet Take 1-2 tablets by mouth every 6 (six) hours as needed for headache. 10/31/16 10/31/17  Loney Hering, MD  diphenhydrAMINE (BENADRYL) 25 mg capsule Take 25 mg by mouth every 4 (four) hours as needed.    [provider]  EPINEPHrine (EPIPEN 2-PAK) 0.3 mg/0.3 mL IJ SOAJ injection Inject 0.3 mg into the muscle once.    [provider]  Fexofenadine-Pseudoephedrine (ALLEGRA-D 12 HOUR PO) Take by mouth every 12 (twelve) hours as needed.    [provider]  fluticasone (FLONASE) 50 MCG/ACT nasal spray Place 2 sprays into both nostrils daily.    [provider]  montelukast (SINGULAIR) 10 MG tablet Take 10 mg by mouth 1 day or 1 dose.    [provider]  omeprazole (PRILOSEC) 40 MG capsule Take 1 capsule (40 mg total) by mouth daily. 09/21/15   Ria Bush, MD    Allergies Other and Sulfa drugs cross reactors  Family History  Problem Relation Age of Onset  . Diabetes Maternal Aunt   . Hypertension Maternal Aunt   . Diabetes Maternal Uncle   . Hypertension Maternal Uncle   . Stroke Maternal Uncle   . Heart disease Maternal Grandmother   . Diabetes Maternal Grandfather   . Cancer Maternal Grandfather  prostate    Social History Social History  Substance Use Topics  . Smoking status: Former Smoker    Quit date: 09/27/2010  . Smokeless tobacco: Never Used  . Alcohol use Yes     Comment: Occasional    Review of Systems  Constitutional: No fever/chills Eyes: No visual changes. ENT: No sore throat. Cardiovascular: Denies chest pain. Respiratory: Denies shortness of breath. Gastrointestinal: nausea with No abdominal pain.  no vomiting.  No diarrhea.  No constipation. Genitourinary: Negative for dysuria. Musculoskeletal: Negative for back  pain. Skin: Negative for rash. Neurological: headache and arm tingling   ____________________________________________   PHYSICAL EXAM:  VITAL SIGNS: ED Triage Vitals  Enc Vitals Group     BP 10/30/16 2121 (!) 148/99     Pulse Rate 10/30/16 2121 62     Resp 10/30/16 2121 18     Temp 10/30/16 2121 98.6 F (37 C)     Temp Source 10/30/16 2121 Oral     SpO2 10/30/16 2121 98 %     Weight 10/30/16 2121 169 lb (76.7 kg)     Height 10/30/16 2121 5\' 2"  (1.575 m)     Head Circumference --      Peak Flow --      Pain Score 10/30/16 2120 5     Pain Loc --      Pain Edu? --      Excl. in [place]? --     Constitutional: Alert and oriented. Well appearing and in mild distress. Eyes: Conjunctivae are normal. PERRL. EOMI. Head: Atraumatic. Nose: No congestion/rhinnorhea. Mouth/Throat: Mucous membranes are moist.  Oropharynx non-erythematous. Cardiovascular: Normal rate, regular rhythm. Grossly normal heart sounds.  Good peripheral circulation. Respiratory: Normal respiratory effort.  No retractions. Lungs CTAB. Gastrointestinal: Soft and nontender. No distention. positive bowel sounds Musculoskeletal: No lower extremity tenderness nor edema.   Neurologic:  Normal speech and language. cranial nerves II through XII are grossly intact with no focal motor or neuro deficits Skin:  Skin is warm, dry and intact.  Psychiatric: Mood and affect are normal.   ____________________________________________   LABS (all labs ordered are listed, but only abnormal results are displayed)  Labs Reviewed - No data to display ____________________________________________  EKG  none ____________________________________________  RADIOLOGY  Ct Head Wo Contrast  Result Date: 10/31/2016 CLINICAL DATA:  Headache, acute, normal neuro exam. EXAM: CT HEAD WITHOUT CONTRAST TECHNIQUE: Contiguous axial images were obtained from the base of the skull through the vertex without intravenous contrast. COMPARISON:   None. FINDINGS: Brain: No intracranial hemorrhage, mass effect, or midline shift. No hydrocephalus. The basilar cisterns are patent. No evidence of territorial infarct or acute ischemia. No extra-axial or intracranial fluid collection. Vascular: No hyperdense vessel or unexpected calcification. Skull: Normal. Negative for fracture or focal lesion. Sinuses/Orbits: No acute finding. Other: None. IMPRESSION: No acute intracranial abnormality.  No explanation for headache. Electronically Signed   By: Jeb Levering M.D.   On: 10/31/2016 01:11    ____________________________________________   PROCEDURES  Procedure(s) performed: None  Procedures  Critical Care performed: No  ____________________________________________   INITIAL IMPRESSION / ASSESSMENT AND PLAN / ED COURSE  Pertinent labs & imaging results that were available during my care of the patient were reviewed by me and considered in my medical decision making (see chart for details).  This is a 36 year old who comes into the hospital today with some elevated blood pressure and headache. The patient has a mild to moderate headache at this time but was concerned  because her blood pressure was mildly elevated. I did send the patient for a CT scan of the CT scan is unremarkable. I gave the patient a shot of Toradol as well as some Fioricet and her headache is improved. I discussed the results with the patient and recommended having her follow back up with her primary care physician. Her headache is moderate in intensity and she has no neurologic deficits. The patient states that she would follow-up with her doctor. The patient's family member did ask about blood pressure medicine but I informed them that I felt that was more appropriate to be prescribed by her primary care physician after her blood pressures have been evaluated more thoroughly. The patient will be discharged to home.       ____________________________________________   FINAL CLINICAL IMPRESSION(S) / ED DIAGNOSES  Final diagnoses:  Acute nonintractable headache, unspecified headache type      NEW MEDICATIONS STARTED DURING THIS VISIT:  New Prescriptions   BUTALBITAL-ACETAMINOPHEN-CAFFEINE (FIORICET, ESGIC) 50-325-40 MG TABLET    Take 1-2 tablets by mouth every 6 (six) hours as needed for headache.     Note:  This document was prepared using Dragon voice recognition software and may include unintentional dictation errors.    Loney Hering, MD 10/31/16 [phone]

## 2016-10-31 NOTE — ED Notes (Signed)
Pt talking on cell phone.  Pt alert  Speech clear.

## 2016-10-31 NOTE — ED Notes (Signed)
Patient transported to CT 

## 2016-11-01 ENCOUNTER — Encounter: Payer: Self-pay | Admitting: Family Medicine

## 2016-11-01 ENCOUNTER — Ambulatory Visit (INDEPENDENT_AMBULATORY_CARE_PROVIDER_SITE_OTHER): Payer: Federal, State, Local not specified - PPO | Admitting: Family Medicine

## 2016-11-01 VITALS — BP 124/88 | HR 62 | Temp 98.1°F | Wt 174.0 lb

## 2016-11-01 DIAGNOSIS — R51 Headache: Secondary | ICD-10-CM

## 2016-11-01 DIAGNOSIS — Z23 Encounter for immunization: Secondary | ICD-10-CM | POA: Diagnosis not present

## 2016-11-01 DIAGNOSIS — I10 Essential (primary) hypertension: Secondary | ICD-10-CM

## 2016-11-01 DIAGNOSIS — R519 Headache, unspecified: Secondary | ICD-10-CM | POA: Insufficient documentation

## 2016-11-01 LAB — CBC WITH DIFFERENTIAL/PLATELET
Basophils Absolute: 0 10*3/uL (ref 0.0–0.1)
Basophils Relative: 0.2 % (ref 0.0–3.0)
Eosinophils Absolute: 0.3 10*3/uL (ref 0.0–0.7)
Eosinophils Relative: 3.8 % (ref 0.0–5.0)
HCT: 41.5 % (ref 36.0–46.0)
Hemoglobin: 13.6 g/dL (ref 12.0–15.0)
Lymphocytes Relative: 26.2 % (ref 12.0–46.0)
Lymphs Abs: 2 10*3/uL (ref 0.7–4.0)
MCHC: 32.8 g/dL (ref 30.0–36.0)
MCV: 89.2 fl (ref 78.0–100.0)
Monocytes Absolute: 0.6 10*3/uL (ref 0.1–1.0)
Monocytes Relative: 7.2 % (ref 3.0–12.0)
Neutro Abs: 4.8 10*3/uL (ref 1.4–7.7)
Neutrophils Relative %: 62.6 % (ref 43.0–77.0)
Platelets: 268 10*3/uL (ref 150.0–400.0)
RBC: 4.65 Mil/uL (ref 3.87–5.11)
RDW: 14.1 % (ref 11.5–15.5)
WBC: 7.7 10*3/uL (ref 4.0–10.5)

## 2016-11-01 LAB — BASIC METABOLIC PANEL
BUN: 8 mg/dL (ref 6–23)
CO2: 27 mEq/L (ref 19–32)
Calcium: 8.7 mg/dL (ref 8.4–10.5)
Chloride: 107 mEq/L (ref 96–112)
Creatinine, Ser: 0.74 mg/dL (ref 0.40–1.20)
GFR: 94.59 mL/min (ref >60.00)
Glucose, Bld: 101 mg/dL — ABNORMAL HIGH (ref 70–99)
Potassium: 4 mEq/L (ref 3.5–5.1)
Sodium: 140 mEq/L (ref 135–145)

## 2016-11-01 LAB — TSH: TSH: 1.92 u[IU]/mL (ref 0.35–4.50)

## 2016-11-01 MED ORDER — AMLODIPINE BESYLATE 2.5 MG PO TABS: 2.5000 mg | ORAL_TABLET | Freq: Every day | ORAL | 6 refills | Status: DC

## 2016-11-01 NOTE — Addendum Note (Signed)
Addended by: Modena Nunnery on: 11/01/2016 09:13 AM   Modules accepted: Orders

## 2016-11-01 NOTE — Assessment & Plan Note (Signed)
New diagnosis. Intermittent, on rpt blood pressure check her BP is improved however has multiple home readings that are elevated. Anticipate likely contributing to headache.  Discussed treatment options, recommended DASH diet.  Will start amlodipine 2.5mg  daily, discussed monitoring for flushing or ankle edema. Monitor for hypotension.  Update with readings in 2 wks.

## 2016-11-01 NOTE — Progress Notes (Addendum)
BP 124/88 (BP Location: Right Arm, Cuff Size: Normal)   Pulse 62   Temp 98.1 F (36.7 C) (Oral)   Wt 174 lb (78.9 kg)   LMP 10/28/2016 Comment: NCP per patient.  SpO2 98%   BMI 31.83 kg/m    CC: headache Subjective:    Patient ID: Sabrina Smith, female    DOB: 05/04/1980, 36 y.o.   MRN: [id number]  HPI: Sabrina Smith is a 35 y.o. female presenting on 11/01/2016 for Hospitalization Follow-up (heachache and elevated BP)   Recent ER visit for headache associated with hypertension to 150/100s. ER records reviewed. Headache not resolved with home treatment of excedrin. Head CT was normal. Treated with toradol IM and fioricet with improvement. She did not pick up fioricet prescription. Headache described as throbbing pain in front of head and occiput. Some nausea and neck pain, but no vomiting. No photo/phonophobia, vision changes. No sinus congestion. No fevers/chills. She doesn't drink much caffeine at all.   BP at home 130-140/90-100. She bought machine and started checking - 150/100. Checked yesterday 140/90, then 112/79.   Increased stress at work - Librarian, academic at [employer].  No diet changes, or increased salt recently.  No h/o migraines or headaches previously.   IUD just removed. Using condoms.   Relevant past medical, surgical, family and social history reviewed and updated as indicated. Interim medical history since our last visit reviewed. Allergies and medications reviewed and updated. Outpatient Medications Prior to Visit  Medication Sig Dispense Refill  . Albuterol Sulfate (PROAIR HFA IN) Inhale into the lungs every 4 (four) hours as needed.     Marland Kitchen EPINEPHrine (EPIPEN 2-PAK) 0.3 mg/0.3 mL IJ SOAJ injection Inject 0.3 mg into the muscle once.    . fluticasone (FLONASE) 50 MCG/ACT nasal spray Place 2 sprays into both nostrils daily.    . montelukast (SINGULAIR) 10 MG tablet Take 10 mg by mouth 1 day or 1 dose.    Marland Kitchen omeprazole (PRILOSEC) 40 MG capsule Take 1 capsule (40  mg total) by mouth daily. 30 capsule 2  . azithromycin (ZITHROMAX Z-PAK) 250 MG tablet As directed 6 each 0  . diphenhydrAMINE (BENADRYL) 25 mg capsule Take 25 mg by mouth every 4 (four) hours as needed.    Marland Kitchen Fexofenadine-Pseudoephedrine (ALLEGRA-D 12 HOUR PO) Take by mouth every 12 (twelve) hours as needed.    . butalbital-acetaminophen-caffeine (FIORICET, ESGIC) 50-325-40 MG tablet Take 1-2 tablets by mouth every 6 (six) hours as needed for headache. (Patient not taking: Reported on 11/01/2016) 20 tablet 0   No facility-administered medications prior to visit.      Per HPI unless specifically indicated in ROS section below Review of Systems     Objective:    BP 124/88 (BP Location: Right Arm, Cuff Size: Normal)   Pulse 62   Temp 98.1 F (36.7 C) (Oral)   Wt 174 lb (78.9 kg)   LMP 10/28/2016 Comment: NCP per patient.  SpO2 98%   BMI 31.83 kg/m   Wt Readings from Last 3 Encounters:  11/01/16 174 lb (78.9 kg)  10/30/16 169 lb (76.7 kg)  08/20/16 174 lb (78.9 kg)    Physical Exam  Constitutional: She appears well-developed and well-nourished. No distress.  HENT:  Mouth/Throat: Oropharynx is clear and moist. No oropharyngeal exudate.  Eyes: Pupils are equal, round, and reactive to light. Conjunctivae and EOM are normal.  Neck: No thyromegaly present.  Cardiovascular: Normal rate, regular rhythm, normal heart sounds and intact distal pulses.  No murmur heard. Pulmonary/Chest: Effort normal and breath sounds normal. No respiratory distress. She has no wheezes. She has no rales.  Musculoskeletal: She exhibits no edema.  Nursing note and vitals reviewed.  EKG - sinus bradycardia rate ~55, normal axis, intervals, no acute ST/T changes. good R wave progression.     Assessment & Plan:   Problem List Items Addressed This Visit    Acute nonintractable headache    Anticipate BP related. Also possible TTH.       Relevant Medications   amLODipine (NORVASC) 2.5 MG tablet    Essential hypertension - Primary    New diagnosis. Intermittent, on rpt blood pressure check her BP is improved however has multiple home readings that are elevated. Anticipate likely contributing to headache.  Discussed treatment options, recommended DASH diet.  Will start amlodipine 2.5mg  daily, discussed monitoring for flushing or ankle edema. Monitor for hypotension.  Update with readings in 2 wks.       Relevant Medications   amLODipine (NORVASC) 2.5 MG tablet   Other Relevant Orders   Basic metabolic panel   CBC with Differential/Platelet   TSH   EKG 12-Lead       Follow up plan: Return in about 3 months (around 01/31/2017), or if symptoms worsen or fail to improve, for follow up visit.  Ria Bush, MD

## 2016-11-01 NOTE — Patient Instructions (Addendum)
Headache could be blood pressure related. Let's start amlodipine 2.5mg  daily.  Your goal blood pressure is <140/90. Work on low salt/sodium diet - goal <1.5gm (1,500mg ) per day. Eat a diet high in fruits/vegetables and whole grains.  Look into DASH diet. Goal activity is 138min/wk of moderate intensity exercise.  This can be split into 30 minute chunks.  If you are not at this level, you can start with smaller 10-15 min increments and slowly build up activity. Look at Helper.org for more resources  Update [place] in 2 weeks with blood pressure readings. Return as needed or in 2-3 months for follow up visit.   DASH Eating Plan DASH stands for "Dietary Approaches to Stop Hypertension." The DASH eating plan is a healthy eating plan that has been shown to reduce high blood pressure (hypertension). It may also reduce your risk for type 2 diabetes, heart disease, and stroke. The DASH eating plan may also help with weight loss. What are tips for following this plan? General guidelines  Avoid eating more than 2,300 mg (milligrams) of salt (sodium) a day. If you have hypertension, you may need to reduce your sodium intake to 1,500 mg a day.  Limit alcohol intake to no more than 1 drink a day for nonpregnant women and 2 drinks a day for men. One drink equals 12 oz of beer, 5 oz of wine, or 1 oz of hard liquor.  Work with your health care provider to maintain a healthy body weight or to lose weight. Ask what an ideal weight is for you.  Get at least 30 minutes of exercise that causes your heart to beat faster (aerobic exercise) most days of the week. Activities may include walking, swimming, or biking.  Work with your health care provider or diet and nutrition specialist (dietitian) to adjust your eating plan to your individual calorie needs. Reading food labels  Check food labels for the amount of sodium per serving. Choose foods with less than 5 percent of the Daily Value of sodium. Generally, foods  with less than 300 mg of sodium per serving fit into this eating plan.  To find whole grains, look for the word "whole" as the first word in the ingredient list. Shopping  Buy products labeled as "low-sodium" or "no salt added."  Buy fresh foods. Avoid canned foods and premade or frozen meals. Cooking  Avoid adding salt when cooking. Use salt-free seasonings or herbs instead of table salt or sea salt. Check with your health care provider or pharmacist before using salt substitutes.  Do not fry foods. Cook foods using healthy methods such as baking, boiling, grilling, and broiling instead.  Cook with heart-healthy oils, such as olive, canola, soybean, or sunflower oil. Meal planning   Eat a balanced diet that includes: ? 5 or more servings of fruits and vegetables each day. At each meal, try to fill half of your plate with fruits and vegetables. ? Up to 6-8 servings of whole grains each day. ? Less than 6 oz of lean meat, poultry, or fish each day. A 3-oz serving of meat is about the same size as a deck of cards. One egg equals 1 oz. ? 2 servings of low-fat dairy each day. ? A serving of nuts, seeds, or beans 5 times each week. ? Heart-healthy fats. Healthy fats called Omega-3 fatty acids are found in foods such as flaxseeds and coldwater fish, like sardines, salmon, and mackerel.  Limit how much you eat of the following: ? Canned or  prepackaged foods. ? Food that is high in trans fat, such as fried foods. ? Food that is high in saturated fat, such as fatty meat. ? Sweets, desserts, sugary drinks, and other foods with added sugar. ? Full-fat dairy products.  Do not salt foods before eating.  Try to eat at least 2 vegetarian meals each week.  Eat more home-cooked food and less restaurant, buffet, and fast food.  When eating at a restaurant, ask that your food be prepared with less salt or no salt, if possible. What foods are recommended? The items listed may not be a complete  list. Talk with your dietitian about what dietary choices are best for you. Grains Whole-grain or whole-wheat bread. Whole-grain or whole-wheat pasta. Brown rice. Modena Morrow. Bulgur. Whole-grain and low-sodium cereals. Pita bread. Low-fat, low-sodium crackers. Whole-wheat flour tortillas. Vegetables Fresh or frozen vegetables (raw, steamed, roasted, or grilled). Low-sodium or reduced-sodium tomato and vegetable juice. Low-sodium or reduced-sodium tomato sauce and tomato paste. Low-sodium or reduced-sodium canned vegetables. Fruits All fresh, dried, or frozen fruit. Canned fruit in natural juice (without added sugar). Meat and other protein foods Skinless chicken or [place]. Ground chicken or [place]. Pork with fat trimmed off. Fish and seafood. Egg whites. Dried beans, peas, or lentils. Unsalted nuts, nut butters, and seeds. Unsalted canned beans. Lean cuts of beef with fat trimmed off. Low-sodium, lean deli meat. Dairy Low-fat (1%) or fat-free (skim) milk. Fat-free, low-fat, or reduced-fat cheeses. Nonfat, low-sodium ricotta or cottage cheese. Low-fat or nonfat yogurt. Low-fat, low-sodium cheese. Fats and oils Soft margarine without trans fats. Vegetable oil. Low-fat, reduced-fat, or light mayonnaise and salad dressings (reduced-sodium). Canola, safflower, olive, soybean, and sunflower oils. Avocado. Seasoning and other foods Herbs. Spices. Seasoning mixes without salt. Unsalted popcorn and pretzels. Fat-free sweets. What foods are not recommended? The items listed may not be a complete list. Talk with your dietitian about what dietary choices are best for you. Grains Baked goods made with fat, such as croissants, muffins, or some breads. Dry pasta or rice meal packs. Vegetables Creamed or fried vegetables. Vegetables in a cheese sauce. Regular canned vegetables (not low-sodium or reduced-sodium). Regular canned tomato sauce and paste (not low-sodium or reduced-sodium). Regular tomato and  vegetable juice (not low-sodium or reduced-sodium). Angie Fava. Olives. Fruits Canned fruit in a light or heavy syrup. Fried fruit. Fruit in cream or butter sauce. Meat and other protein foods Fatty cuts of meat. Ribs. Fried meat. Berniece Salines. Sausage. Bologna and other processed lunch meats. Salami. Fatback. Hotdogs. Bratwurst. Salted nuts and seeds. Canned beans with added salt. Canned or smoked fish. Whole eggs or egg yolks. Chicken or [place] with skin. Dairy Whole or 2% milk, cream, and half-and-half. Whole or full-fat cream cheese. Whole-fat or sweetened yogurt. Full-fat cheese. Nondairy creamers. Whipped toppings. Processed cheese and cheese spreads. Fats and oils Butter. Stick margarine. Lard. Shortening. Ghee. Bacon fat. Tropical oils, such as coconut, palm kernel, or palm oil. Seasoning and other foods Salted popcorn and pretzels. Onion salt, garlic salt, seasoned salt, table salt, and sea salt. Worcestershire sauce. Tartar sauce. Barbecue sauce. Teriyaki sauce. Soy sauce, including reduced-sodium. Steak sauce. Canned and packaged gravies. Fish sauce. Oyster sauce. Cocktail sauce. Horseradish that you find on the shelf. Ketchup. Mustard. Meat flavorings and tenderizers. Bouillon cubes. Hot sauce and Tabasco sauce. Premade or packaged marinades. Premade or packaged taco seasonings. Relishes. Regular salad dressings. Where to find more information:  National Heart, Lung, and [place]: https://wilson-eaton.com/  American Heart Association: www.heart.org Summary  The DASH  eating plan is a healthy eating plan that has been shown to reduce high blood pressure (hypertension). It may also reduce your risk for type 2 diabetes, heart disease, and stroke.  With the DASH eating plan, you should limit salt (sodium) intake to 2,300 mg a day. If you have hypertension, you may need to reduce your sodium intake to 1,500 mg a day.  When on the DASH eating plan, aim to eat more fresh fruits and vegetables, whole  grains, lean proteins, low-fat dairy, and heart-healthy fats.  Work with your health care provider or diet and nutrition specialist (dietitian) to adjust your eating plan to your individual calorie needs. This information is not intended to replace advice given to you by your health care provider. Make sure you discuss any questions you have with your health care provider. Document Released: 02/01/2011 Document Revised: 02/06/2016 Document Reviewed: 02/06/2016 Elsevier Interactive Patient Education  2017 [employer].

## 2016-11-01 NOTE — Assessment & Plan Note (Signed)
Anticipate BP related. Also possible TTH.

## 2017-04-05 ENCOUNTER — Encounter: Payer: Self-pay | Admitting: Family Medicine

## 2017-04-05 ENCOUNTER — Ambulatory Visit
Admission: RE | Admit: 2017-04-05 | Discharge: 2017-04-05 | Disposition: A | Discharge status: Home / Self Care | Payer: Federal, State, Local not specified - PPO | Source: Ambulatory Visit | Attending: Family Medicine | Admitting: Family Medicine

## 2017-04-05 ENCOUNTER — Ambulatory Visit: Payer: Federal, State, Local not specified - PPO | Admitting: Family Medicine

## 2017-04-05 VITALS — BP 118/62 | HR 69 | Temp 98.1°F | Wt 183.0 lb

## 2017-04-05 DIAGNOSIS — I10 Essential (primary) hypertension: Secondary | ICD-10-CM | POA: Diagnosis not present

## 2017-04-05 DIAGNOSIS — Z8349 Family history of other endocrine, nutritional and metabolic diseases: Secondary | ICD-10-CM | POA: Insufficient documentation

## 2017-04-05 DIAGNOSIS — E041 Nontoxic single thyroid nodule: Secondary | ICD-10-CM | POA: Diagnosis not present

## 2017-04-05 MED ORDER — EPINEPHRINE 0.3 MG/0.3ML IJ SOAJ: 0.3000 mg | Freq: Once | INTRAMUSCULAR | 1 refills | Status: AC

## 2017-04-05 MED ORDER — FLUTICASONE PROPIONATE 50 MCG/ACT NA SUSP: 2.0000 | Freq: Every day | NASAL | 3 refills | Status: DC

## 2017-04-05 MED ORDER — MONTELUKAST SODIUM 10 MG PO TABS: 10.0000 mg | ORAL_TABLET | ORAL | 11 refills | Status: DC

## 2017-04-05 MED ORDER — ALBUTEROL SULFATE HFA 108 (90 BASE) MCG/ACT IN AERS: 2.0000 | INHALATION_SPRAY | RESPIRATORY_TRACT | 3 refills | Status: DC | PRN

## 2017-04-05 MED ORDER — OMEPRAZOLE 40 MG PO CPDR: 40.0000 mg | DELAYED_RELEASE_CAPSULE | Freq: Every day | ORAL | 3 refills | Status: DC | PRN

## 2017-04-05 NOTE — Assessment & Plan Note (Signed)
Chronic, stable. Continue amlodipine. Encouraged continued work towards healthy diet and lifestyle and may be able to stop antihypertensive.

## 2017-04-05 NOTE — Assessment & Plan Note (Signed)
Reassuringly normal TSH last year. Discussed with patient. Possible thyroid nodule palpated on exam but overall pt asxs. Will check baseline thyroid US r/o nodule. Pt agrees with plan.

## 2017-04-05 NOTE — Patient Instructions (Addendum)
See Sabrina Smith on your way out to schedule thyroid ultrasound. We will be in touch with results. Your thyroid levels were normal 10/2016.  Good to see you today, call us with questions.

## 2017-04-05 NOTE — Progress Notes (Signed)
BP 118/62 (BP Location: Left Arm, Patient Position: Sitting, Cuff Size: Normal)   Pulse 69   Temp 98.1 F (36.7 C) (Oral)   Wt 183 lb (83 kg)   LMP 03/15/2017   SpO2 100%   BMI 33.47 kg/m    CC: check thyroid  Subjective:    Patient ID: Sabrina Smith, female    DOB: [birth year], 37 y.o.   MRN: [id number]  HPI: Sabrina Smith is a 37 y.o. female presenting on 04/05/2017 for Thyroid check (Pt's mother has thyroid issues and suggest pt be checked. Mom is concerned about how pt's throat anterior looks.)   Fmhx thyroid disease (mother) - mother was worried about how neck was looking.  Energy levels ok, denies constipation/diarrhea, skin or hair changes, cold or heat intolerance, unexpected weight gain or loss.   Requests meds refilled.  Relevant past medical, surgical, family and social history reviewed and updated as indicated. Interim medical history since our last visit reviewed. Allergies and medications reviewed and updated. Outpatient Medications Prior to Visit  Medication Sig Dispense Refill  . amLODipine (NORVASC) 2.5 MG tablet Take 1 tablet (2.5 mg total) by mouth daily. 30 tablet 6  . Albuterol Sulfate (PROAIR HFA IN) Inhale into the lungs every 4 (four) hours as needed.     Marland Kitchen EPINEPHrine (EPIPEN 2-PAK) 0.3 mg/0.3 mL IJ SOAJ injection Inject 0.3 mg into the muscle once.    . fluticasone (FLONASE) 50 MCG/ACT nasal spray Place 2 sprays into both nostrils daily.    . montelukast (SINGULAIR) 10 MG tablet Take 10 mg by mouth 1 day or 1 dose.    Marland Kitchen omeprazole (PRILOSEC) 40 MG capsule Take 1 capsule (40 mg total) by mouth daily. 30 capsule 2  . butalbital-acetaminophen-caffeine (FIORICET, ESGIC) 50-325-40 MG tablet Take 1-2 tablets by mouth every 6 (six) hours as needed for headache. (Patient not taking: Reported on 04/05/2017) 20 tablet 0   No facility-administered medications prior to visit.      Per HPI unless specifically indicated in ROS section below Review of Systems     Objective:    BP 118/62 (BP Location: Left Arm, Patient Position: Sitting, Cuff Size: Normal)   Pulse 69   Temp 98.1 F (36.7 C) (Oral)   Wt 183 lb (83 kg)   LMP 03/15/2017   SpO2 100%   BMI 33.47 kg/m   Wt Readings from Last 3 Encounters:  04/05/17 183 lb (83 kg)  11/01/16 174 lb (78.9 kg)  10/30/16 169 lb (76.7 kg)    Physical Exam  Constitutional: She appears well-developed and well-nourished. No distress.  HENT:  Head: Normocephalic and atraumatic.  Mouth/Throat: Oropharynx is clear and moist. No oropharyngeal exudate.  Eyes: Conjunctivae and EOM are normal. Pupils are equal, round, and reactive to light. No scleral icterus.  Neck: Normal range of motion. Neck supple. Carotid bruit is not present. No thyromegaly (possible R thyroid nodule) present.  Cardiovascular: Normal rate, regular rhythm, normal heart sounds and intact distal pulses.  No murmur heard. Pulmonary/Chest: Effort normal and breath sounds normal. No respiratory distress. She has no wheezes. She has no rales.  Lymphadenopathy:    She has no cervical adenopathy.  Skin: Skin is warm and dry. No rash noted.   Results for orders placed or performed in visit on 95/62/13  Basic metabolic panel  Result Value Ref Range   Sodium 140 135 - 145 mEq/L   Potassium 4.0 3.5 - 5.1 mEq/L   Chloride 107 96 - 112  mEq/L   CO2 27 19 - 32 mEq/L   Glucose, Bld 101 (H) 70 - 99 mg/dL   BUN 8 6 - 23 mg/dL   Creatinine, Ser 0.74 0.40 - 1.20 mg/dL   Calcium 8.7 8.4 - 10.5 mg/dL   GFR 94.59 >60.00 mL/min  CBC with Differential/Platelet  Result Value Ref Range   WBC 7.7 4.0 - 10.5 K/uL   RBC 4.65 3.87 - 5.11 Mil/uL   Hemoglobin 13.6 12.0 - 15.0 g/dL   HCT 41.5 36.0 - 46.0 %   MCV 89.2 78.0 - 100.0 fl   MCHC 32.8 30.0 - 36.0 g/dL   RDW 14.1 11.5 - 15.5 %   Platelets 268.0 150.0 - 400.0 K/uL   Neutrophils Relative % 62.6 43.0 - 77.0 %   Lymphocytes Relative 26.2 12.0 - 46.0 %   Monocytes Relative 7.2 3.0 - 12.0 %    Eosinophils Relative 3.8 0.0 - 5.0 %   Basophils Relative 0.2 0.0 - 3.0 %   Neutro Abs 4.8 1.4 - 7.7 K/uL   Lymphs Abs 2.0 0.7 - 4.0 K/uL   Monocytes Absolute 0.6 0.1 - 1.0 K/uL   Eosinophils Absolute 0.3 0.0 - 0.7 K/uL   Basophils Absolute 0.0 0.0 - 0.1 K/uL  TSH  Result Value Ref Range   TSH 1.92 0.35 - 4.50 uIU/mL      Assessment & Plan:   Problem List Items Addressed This Visit    Essential hypertension    Chronic, stable. Continue amlodipine. Encouraged continued work towards healthy diet and lifestyle and may be able to stop antihypertensive.       Relevant Medications   EPINEPHrine (EPIPEN 2-PAK) 0.3 mg/0.3 mL IJ SOAJ injection   Thyroid nodule - Primary    Reassuringly normal TSH last year. Discussed with patient. Possible thyroid nodule palpated on exam but overall pt asxs. Will check baseline thyroid US r/o nodule. Pt agrees with plan.       Relevant Orders   US THYROID       Follow up plan: Return if symptoms worsen or fail to improve.  Ria Bush, MD

## 2017-04-06 ENCOUNTER — Encounter: Payer: Self-pay | Admitting: Family Medicine

## 2017-04-09 ENCOUNTER — Ambulatory Visit: Payer: Federal, State, Local not specified - PPO | Admitting: Obstetrics & Gynecology

## 2017-04-10 ENCOUNTER — Ambulatory Visit (INDEPENDENT_AMBULATORY_CARE_PROVIDER_SITE_OTHER): Payer: Federal, State, Local not specified - PPO | Admitting: Obstetrics & Gynecology

## 2017-04-10 ENCOUNTER — Encounter: Payer: Self-pay | Admitting: Obstetrics & Gynecology

## 2017-04-10 VITALS — BP 122/80 | HR 72 | Ht 62.0 in | Wt 186.0 lb

## 2017-04-10 DIAGNOSIS — L299 Pruritus, unspecified: Secondary | ICD-10-CM | POA: Diagnosis not present

## 2017-04-10 NOTE — Progress Notes (Signed)
HPI:      [name] is a 37 y.o. G2P1011 who is premenopausal, presents today for a problem visit.  She complains of dryness and pruritus on the leftside which she first noticed 1 month.  It has not significantly changed.  Associated symptoms include none.  Denies nipple discharge or skin changes.  No fever.  Has tried lotion with limited improvement.  Nothing associated makes it worse.  Prior breast problems: No Family History: Breast Cancer-relatedfamily history is not on file.  PMHx: She  has a past medical history of Allergic rhinitis, History of chicken pox, and Shingles (1990s). Also,  has a past surgical history that includes No past surgeries., family history includes Cancer in her maternal grandfather; Diabetes in her maternal aunt, maternal grandfather, and maternal uncle; Heart disease in her maternal grandmother; Hypertension in her brother, maternal aunt, maternal uncle, and mother; Stroke in her maternal uncle.,  reports that she quit smoking about 6 years ago. she has never used smokeless tobacco. She reports that she drinks alcohol. She reports that she does not use drugs.  She has a current medication list which includes the following prescription(s): albuterol, amlodipine, butalbital-acetaminophen-caffeine, fluticasone, montelukast, and omeprazole. Also, is allergic to other and sulfa drugs cross reactors.  Review of Systems  Constitutional: Negative for chills, fever and malaise/fatigue.  HENT: Negative for congestion, sinus pain and sore throat.   Eyes: Negative for blurred vision and pain.  Respiratory: Negative for cough and wheezing.   Cardiovascular: Negative for chest pain and leg swelling.  Gastrointestinal: Negative for abdominal pain, constipation, diarrhea, heartburn, nausea and vomiting.  Genitourinary: Negative for dysuria, frequency, hematuria and urgency.  Musculoskeletal: Negative for back pain, joint pain, myalgias and neck pain.  Skin: Negative for  itching and rash.  Neurological: Negative for dizziness, tremors and weakness.  Endo/Heme/Allergies: Does not bruise/bleed easily.  Psychiatric/Behavioral: Negative for depression. The patient is not nervous/anxious and does not have insomnia.     Objective: BP 122/80   Pulse 72   Ht 5\' 2"  (1.575 m)   Wt 186 lb (84.4 kg)   LMP 03/15/2017   BMI 34.02 kg/m  Physical Exam  Constitutional: She is oriented to person, place, and time. She appears well-developed and well-nourished. No distress.  Cardiovascular: Normal rate, regular rhythm, normal heart sounds and normal pulses. Exam reveals no gallop and no friction rub.  No murmur heard. Pulmonary/Chest: Effort normal and breath sounds normal. She exhibits no mass, no tenderness, no edema, no deformity, no swelling and no retraction. Right breast exhibits no inverted nipple, no mass, no nipple discharge, no skin change and no tenderness. Left breast exhibits no inverted nipple, no mass, no nipple discharge, no skin change and no tenderness. There is no breast swelling.  Abdominal: Normal appearance.  Musculoskeletal: Normal range of motion.  Lymphadenopathy:    She has no axillary adenopathy.       Right axillary: No pectoral and no lateral adenopathy present.       Left axillary: No pectoral and no lateral adenopathy present. Neurological: She is alert and oriented to person, place, and time.  Skin: Skin is warm and dry. No abrasion, no bruising, no lesion and no rash noted. No erythema.  Psychiatric: She has a normal mood and affect. Her speech is normal and behavior is normal. Judgment normal.  Vitals reviewed.  ASSESSMENT/PLAN: : 1. Pruritus No sign of yeast infection, tinea, other infection No mass or other sign of FCC, cancer Treat with VitE  based lotion, monitor for s/sx infection or mass  Barnett Applebaum, MD, [place] [facility] 04/10/2017  4:08 PM

## 2017-04-18 DIAGNOSIS — L74519 Primary focal hyperhidrosis, unspecified: Secondary | ICD-10-CM | POA: Diagnosis not present

## 2017-04-18 DIAGNOSIS — L74510 Primary focal hyperhidrosis, axilla: Secondary | ICD-10-CM | POA: Diagnosis not present

## 2017-04-19 ENCOUNTER — Encounter: Payer: Self-pay | Admitting: Family Medicine

## 2017-04-19 DIAGNOSIS — R61 Generalized hyperhidrosis: Secondary | ICD-10-CM | POA: Insufficient documentation

## 2017-05-14 DIAGNOSIS — J069 Acute upper respiratory infection, unspecified: Secondary | ICD-10-CM | POA: Diagnosis not present

## 2017-05-15 DIAGNOSIS — L74510 Primary focal hyperhidrosis, axilla: Secondary | ICD-10-CM | POA: Diagnosis not present

## 2017-05-15 DIAGNOSIS — L309 Dermatitis, unspecified: Secondary | ICD-10-CM | POA: Diagnosis not present

## 2017-08-08 ENCOUNTER — Ambulatory Visit: Payer: Federal, State, Local not specified - PPO | Admitting: Family Medicine

## 2017-08-08 ENCOUNTER — Encounter: Payer: Self-pay | Admitting: Family Medicine

## 2017-08-08 VITALS — BP 134/90 | HR 71 | Temp 98.6°F | Ht 62.0 in | Wt 183.8 lb

## 2017-08-08 DIAGNOSIS — R51 Headache: Secondary | ICD-10-CM

## 2017-08-08 DIAGNOSIS — I10 Essential (primary) hypertension: Secondary | ICD-10-CM

## 2017-08-08 DIAGNOSIS — R519 Headache, unspecified: Secondary | ICD-10-CM

## 2017-08-08 MED ORDER — AMLODIPINE BESYLATE 5 MG PO TABS: 5.0000 mg | ORAL_TABLET | Freq: Every day | ORAL | 3 refills | Status: DC

## 2017-08-08 NOTE — Assessment & Plan Note (Addendum)
Chronic, likely deteriorated based on recall readings. Unclear reason why.  Will increase amlodipine to 5mg  daily.  Provided with DASH diet.  Update if not improving with treatment.

## 2017-08-08 NOTE — Assessment & Plan Note (Addendum)
Anticipate HTN related.  Will treat BP and reassess.  May take excedrin PRN.

## 2017-08-08 NOTE — Patient Instructions (Signed)
Increase amlodipine to 5mg  daily. May try excedrin as needed for headache - I think this is from elevated blood pressures.  Update me if no better with above.   DASH Eating Plan DASH stands for "Dietary Approaches to Stop Hypertension." The DASH eating plan is a healthy eating plan that has been shown to reduce high blood pressure (hypertension). It may also reduce your risk for type 2 diabetes, heart disease, and stroke. The DASH eating plan may also help with weight loss. What are tips for following this plan? General guidelines  Avoid eating more than 2,300 mg (milligrams) of salt (sodium) a day. If you have hypertension, you may need to reduce your sodium intake to 1,500 mg a day.  Limit alcohol intake to no more than 1 drink a day for nonpregnant women and 2 drinks a day for men. One drink equals 12 oz of beer, 5 oz of wine, or 1 oz of hard liquor.  Work with your health care provider to maintain a healthy body weight or to lose weight. Ask what an ideal weight is for you.  Get at least 30 minutes of exercise that causes your heart to beat faster (aerobic exercise) most days of the week. Activities may include walking, swimming, or biking.  Work with your health care provider or diet and nutrition specialist (dietitian) to adjust your eating plan to your individual calorie needs. Reading food labels  Check food labels for the amount of sodium per serving. Choose foods with less than 5 percent of the Daily Value of sodium. Generally, foods with less than 300 mg of sodium per serving fit into this eating plan.  To find whole grains, look for the word "whole" as the first word in the ingredient list. Shopping  Buy products labeled as "low-sodium" or "no salt added."  Buy fresh foods. Avoid canned foods and premade or frozen meals. Cooking  Avoid adding salt when cooking. Use salt-free seasonings or herbs instead of table salt or sea salt. Check with your health care provider or  pharmacist before using salt substitutes.  Do not fry foods. Cook foods using healthy methods such as baking, boiling, grilling, and broiling instead.  Cook with heart-healthy oils, such as olive, canola, soybean, or sunflower oil. Meal planning   Eat a balanced diet that includes: ? 5 or more servings of fruits and vegetables each day. At each meal, try to fill half of your plate with fruits and vegetables. ? Up to 6-8 servings of whole grains each day. ? Less than 6 oz of lean meat, poultry, or fish each day. A 3-oz serving of meat is about the same size as a deck of cards. One egg equals 1 oz. ? 2 servings of low-fat dairy each day. ? A serving of nuts, seeds, or beans 5 times each week. ? Heart-healthy fats. Healthy fats called Omega-3 fatty acids are found in foods such as flaxseeds and coldwater fish, like sardines, salmon, and mackerel.  Limit how much you eat of the following: ? Canned or prepackaged foods. ? Food that is high in trans fat, such as fried foods. ? Food that is high in saturated fat, such as fatty meat. ? Sweets, desserts, sugary drinks, and other foods with added sugar. ? Full-fat dairy products.  Do not salt foods before eating.  Try to eat at least 2 vegetarian meals each week.  Eat more home-cooked food and less restaurant, buffet, and fast food.  When eating at a restaurant, ask that  your food be prepared with less salt or no salt, if possible. What foods are recommended? The items listed may not be a complete list. Talk with your dietitian about what dietary choices are best for you. Grains Whole-grain or whole-wheat bread. Whole-grain or whole-wheat pasta. Brown rice. [place]row. Bulgur. Whole-grain and low-sodium cereals. Pita bread. Low-fat, low-sodium crackers. Whole-wheat flour tortillas. Vegetables Fresh or frozen vegetables (raw, steamed, roasted, or grilled). Low-sodium or reduced-sodium tomato and vegetable juice. Low-sodium or  reduced-sodium tomato sauce and tomato paste. Low-sodium or reduced-sodium canned vegetables. Fruits All fresh, dried, or frozen fruit. Canned fruit in natural juice (without added sugar). Meat and other protein foods Skinless chicken or [place]. Ground chicken or [place]. Pork with fat trimmed off. Fish and seafood. Egg whites. Dried beans, peas, or lentils. Unsalted nuts, nut butters, and seeds. Unsalted canned beans. Lean cuts of beef with fat trimmed off. Low-sodium, lean deli meat. Dairy Low-fat (1%) or fat-free (skim) milk. Fat-free, low-fat, or reduced-fat cheeses. Nonfat, low-sodium ricotta or cottage cheese. Low-fat or nonfat yogurt. Low-fat, low-sodium cheese. Fats and oils Soft margarine without trans fats. Vegetable oil. Low-fat, reduced-fat, or light mayonnaise and salad dressings (reduced-sodium). Canola, safflower, olive, soybean, and sunflower oils. Avocado. Seasoning and other foods Herbs. Spices. Seasoning mixes without salt. Unsalted popcorn and pretzels. Fat-free sweets. What foods are not recommended? The items listed may not be a complete list. Talk with your dietitian about what dietary choices are best for you. Grains Baked goods made with fat, such as croissants, muffins, or some breads. Dry pasta or rice meal packs. Vegetables Creamed or fried vegetables. Vegetables in a cheese sauce. Regular canned vegetables (not low-sodium or reduced-sodium). Regular canned tomato sauce and paste (not low-sodium or reduced-sodium). Regular tomato and vegetable juice (not low-sodium or reduced-sodium). Angie Fava. Olives. Fruits Canned fruit in a light or heavy syrup. Fried fruit. Fruit in cream or butter sauce. Meat and other protein foods Fatty cuts of meat. Ribs. Fried meat. Berniece Salines. Sausage. Bologna and other processed lunch meats. Salami. Fatback. Hotdogs. Bratwurst. Salted nuts and seeds. Canned beans with added salt. Canned or smoked fish. Whole eggs or egg yolks. Chicken or [place]  with skin. Dairy Whole or 2% milk, cream, and half-and-half. Whole or full-fat cream cheese. Whole-fat or sweetened yogurt. Full-fat cheese. Nondairy creamers. Whipped toppings. Processed cheese and cheese spreads. Fats and oils Butter. Stick margarine. Lard. Shortening. Ghee. Bacon fat. Tropical oils, such as coconut, palm kernel, or palm oil. Seasoning and other foods Salted popcorn and pretzels. Onion salt, garlic salt, seasoned salt, table salt, and sea salt. Worcestershire sauce. Tartar sauce. Barbecue sauce. Teriyaki sauce. Soy sauce, including reduced-sodium. Steak sauce. Canned and packaged gravies. Fish sauce. Oyster sauce. Cocktail sauce. Horseradish that you find on the shelf. Ketchup. Mustard. Meat flavorings and tenderizers. Bouillon cubes. Hot sauce and Tabasco sauce. Premade or packaged marinades. Premade or packaged taco seasonings. Relishes. Regular salad dressings. Where to find more information:  National Heart, Lung, and [place]: https://wilson-eaton.com/  American Heart Association: www.heart.org Summary  The DASH eating plan is a healthy eating plan that has been shown to reduce high blood pressure (hypertension). It may also reduce your risk for type 2 diabetes, heart disease, and stroke.  With the DASH eating plan, you should limit salt (sodium) intake to 2,300 mg a day. If you have hypertension, you may need to reduce your sodium intake to 1,500 mg a day.  When on the DASH eating plan, aim to eat more fresh fruits and  vegetables, whole grains, lean proteins, low-fat dairy, and heart-healthy fats.  Work with your health care provider or diet and nutrition specialist (dietitian) to adjust your eating plan to your individual calorie needs. This information is not intended to replace advice given to you by your health care provider. Make sure you discuss any questions you have with your health care provider. Document Released: 02/01/2011 Document Revised: 02/06/2016  Document Reviewed: 02/06/2016 Elsevier Interactive Patient Education  [employer].

## 2017-08-08 NOTE — Progress Notes (Signed)
BP 134/90 (BP Location: Right Arm, Cuff Size: Normal)   Pulse 71   Temp 98.6 F (37 C) (Oral)   Ht 5\' 2"  (1.575 m)   Wt 183 lb 12 oz (83.3 kg)   LMP 08/04/2017   SpO2 98%   BMI 33.61 kg/m    CC: headache with elevated BP Subjective:    Patient ID: Sabrina Smith, female    DOB: 1980-06-21, 37 y.o.   MRN: [id number]  HPI: Sabrina Smith is a 37 y.o. female presenting on 08/08/2017 for Headache (Constant HA since 08/05/17. Tried ibuprofen, helpful.) and Elevated BP (BP was 161/69 on 08/06/17. Yesterday, 137/83)   Several day h/o elevated blood pressures (161/69, yesterday 130/80s) associated with headaches. Describes frontal throbbing pain that can last for hours. Headache temporarily improves with ibuprofen. No vision changes, CP/tightness, SOB, leg swelling.   Increased work stress.  No changes to salt in diet - tries to avoid.  Drinks plenty of water.  Minimal caffeine Averages 6 hours of sleep a night.   She has been taking amlodipine - took 2nd pill one night.   Relevant past medical, surgical, family and social history reviewed and updated as indicated. Interim medical history since our last visit reviewed. Allergies and medications reviewed and updated. Outpatient Medications Prior to Visit  Medication Sig Dispense Refill  . albuterol (PROAIR HFA) 108 (90 Base) MCG/ACT inhaler Inhale 2 puffs into the lungs every 4 (four) hours as needed. 1 Inhaler 3  . fluticasone (FLONASE) 50 MCG/ACT nasal spray Place 2 sprays into both nostrils daily. 16 g 3  . montelukast (SINGULAIR) 10 MG tablet Take 1 tablet (10 mg total) by mouth 1 day or 1 dose. 30 tablet 11  . omeprazole (PRILOSEC) 40 MG capsule Take 1 capsule (40 mg total) by mouth daily as needed (heartburn). 30 capsule 3  . amLODipine (NORVASC) 2.5 MG tablet Take 1 tablet (2.5 mg total) by mouth daily. 30 tablet 6  . butalbital-acetaminophen-caffeine (FIORICET, ESGIC) 50-325-40 MG tablet Take 1-2 tablets by mouth every 6  (six) hours as needed for headache. (Patient not taking: Reported on 04/05/2017) 20 tablet 0   No facility-administered medications prior to visit.      Per HPI unless specifically indicated in ROS section below Review of Systems     Objective:    BP 134/90 (BP Location: Right Arm, Cuff Size: Normal)   Pulse 71   Temp 98.6 F (37 C) (Oral)   Ht 5\' 2"  (1.575 m)   Wt 183 lb 12 oz (83.3 kg)   LMP 08/04/2017   SpO2 98%   BMI 33.61 kg/m   Wt Readings from Last 3 Encounters:  08/08/17 183 lb 12 oz (83.3 kg)  04/10/17 186 lb (84.4 kg)  04/05/17 183 lb (83 kg)    BP Readings from Last 3 Encounters:  08/08/17 134/90  04/10/17 122/80  04/05/17 118/62    Physical Exam  Constitutional: She appears well-developed and well-nourished. No distress.  HENT:  Mouth/Throat: Oropharynx is clear and moist.  Eyes: Pupils are equal, round, and reactive to light. EOM are normal.  Neck: Normal range of motion.  Cardiovascular: Normal rate, regular rhythm and normal heart sounds.  No murmur heard. Pulmonary/Chest: Effort normal and breath sounds normal. No respiratory distress. She has no wheezes. She has no rales.  Musculoskeletal: She exhibits no edema.  Neurological: She is alert. She has normal strength. Coordination and gait normal.  CN 2-12 intact Station and gait intact FTN intact  EOMI  Psychiatric: She has a normal mood and affect. Her behavior is normal.  Nursing note and vitals reviewed.  Results for orders placed or performed in visit on 66/59/93  Basic metabolic panel  Result Value Ref Range   Sodium 140 135 - 145 mEq/L   Potassium 4.0 3.5 - 5.1 mEq/L   Chloride 107 96 - 112 mEq/L   CO2 27 19 - 32 mEq/L   Glucose, Bld 101 (H) 70 - 99 mg/dL   BUN 8 6 - 23 mg/dL   Creatinine, Ser 0.74 0.40 - 1.20 mg/dL   Calcium 8.7 8.4 - 10.5 mg/dL   GFR 94.59 >60.00 mL/min  CBC with Differential/Platelet  Result Value Ref Range   WBC 7.7 4.0 - 10.5 K/uL   RBC 4.65 3.87 - 5.11 Mil/uL    Hemoglobin 13.6 12.0 - 15.0 g/dL   HCT 41.5 36.0 - 46.0 %   MCV 89.2 78.0 - 100.0 fl   MCHC 32.8 30.0 - 36.0 g/dL   RDW 14.1 11.5 - 15.5 %   Platelets 268.0 150.0 - 400.0 K/uL   Neutrophils Relative % 62.6 43.0 - 77.0 %   Lymphocytes Relative 26.2 12.0 - 46.0 %   Monocytes Relative 7.2 3.0 - 12.0 %   Eosinophils Relative 3.8 0.0 - 5.0 %   Basophils Relative 0.2 0.0 - 3.0 %   Neutro Abs 4.8 1.4 - 7.7 K/uL   Lymphs Abs 2.0 0.7 - 4.0 K/uL   Monocytes Absolute 0.6 0.1 - 1.0 K/uL   Eosinophils Absolute 0.3 0.0 - 0.7 K/uL   Basophils Absolute 0.0 0.0 - 0.1 K/uL  TSH  Result Value Ref Range   TSH 1.92 0.35 - 4.50 uIU/mL      Assessment & Plan:   Problem List Items Addressed This Visit    Essential hypertension - Primary    Chronic, likely deteriorated based on recall readings. Unclear reason why.  Will increase amlodipine to 5mg  daily.  Provided with DASH diet.  Update if not improving with treatment.       Relevant Medications   amLODipine (NORVASC) 5 MG tablet   Acute nonintractable headache    Anticipate HTN related.  Will treat BP and reassess.  May take excedrin PRN.      Relevant Medications   amLODipine (NORVASC) 5 MG tablet       Meds ordered this encounter  Medications  . amLODipine (NORVASC) 5 MG tablet    Sig: Take 1 tablet (5 mg total) by mouth daily.    Dispense:  90 tablet    Refill:  3   No orders of the defined types were placed in this encounter.   Follow up plan: Return if symptoms worsen or fail to improve.  Sabrina Bush, MD

## 2017-11-13 DIAGNOSIS — J305 Allergic rhinitis due to food: Secondary | ICD-10-CM | POA: Diagnosis not present

## 2017-11-13 DIAGNOSIS — J301 Allergic rhinitis due to pollen: Secondary | ICD-10-CM | POA: Diagnosis not present

## 2017-11-13 DIAGNOSIS — J3081 Allergic rhinitis due to animal (cat) (dog) hair and dander: Secondary | ICD-10-CM | POA: Diagnosis not present

## 2017-11-13 DIAGNOSIS — J343 Hypertrophy of nasal turbinates: Secondary | ICD-10-CM | POA: Diagnosis not present

## 2017-12-05 ENCOUNTER — Ambulatory Visit: Payer: Federal, State, Local not specified - PPO | Admitting: Family Medicine

## 2017-12-05 ENCOUNTER — Encounter: Payer: Self-pay | Admitting: Family Medicine

## 2017-12-05 VITALS — BP 118/76 | HR 69 | Temp 98.3°F | Ht 62.0 in | Wt 185.5 lb

## 2017-12-05 DIAGNOSIS — I10 Essential (primary) hypertension: Secondary | ICD-10-CM | POA: Diagnosis not present

## 2017-12-05 DIAGNOSIS — Z23 Encounter for immunization: Secondary | ICD-10-CM

## 2017-12-05 DIAGNOSIS — R51 Headache: Secondary | ICD-10-CM | POA: Diagnosis not present

## 2017-12-05 DIAGNOSIS — R519 Headache, unspecified: Secondary | ICD-10-CM

## 2017-12-05 MED ORDER — CYCLOBENZAPRINE HCL 5 MG PO TABS: 5.0000 mg | ORAL_TABLET | Freq: Two times a day (BID) | ORAL | 1 refills | Status: DC | PRN

## 2017-12-05 MED ORDER — KETOROLAC TROMETHAMINE 30 MG/ML IJ SOLN
30.0000 mg | Freq: Once | INTRAMUSCULAR | Status: AC
  Administered 2017-12-05: 30 mg via INTRAMUSCULAR

## 2017-12-05 NOTE — Assessment & Plan Note (Addendum)
Acute activity limiting headache yesterday associated with some nausea and photophobia. Normal neurological exam today. ?developing migraine. Treat with toradol shot today, flexeril abortively. Update with effect. Consider head imaging if no improvement with treatment. No need for preventative medication at this time. Handout provided today.

## 2017-12-05 NOTE — Progress Notes (Signed)
BP 118/76 (BP Location: Left Arm, Patient Position: Sitting, Cuff Size: Normal)   Pulse 69   Temp 98.3 F (36.8 C) (Oral)   Ht 5\' 2"  (1.575 m)   Wt 185 lb 8 oz (84.1 kg)   LMP 11/29/2017   SpO2 99%   BMI 33.93 kg/m    CC: headache Subjective:    Patient ID: Sabrina Smith, female    DOB: [birth year], 37 y.o.   MRN: [id number]  HPI: Sabrina Smith is a 37 y.o. female presenting on 12/05/2017 for Headache (C/o constant HA that started yesterday. Located on right side of head. Tried Excedrin Migraine, not helpful.)   1d h/o R frontal headache that started in temple and radiated to back of head. Describes persistent parietal pressure. Mild nausea yesterday. Mild photo/phonophobia. At its worse 10/10, currently 5/10. Activity limiting yesterday - laid in dark room. Denies vision changes. No significant improvement with excedrin migraine. No aura.  Attributes to increased stress - working more days per week.   H/o HA with elevated blood pressures in the past. These improved with better BP control - amlodipine increased to 5mg  07/2017. Home BP readings ok.  BP during bad HA yesterday 130/80s.   Averages 6 hours of sleep.  Minimal caffeine.  No h/o migraines. No fmhx aneurysms.  Not regularly taking singulair - last dose 2 wks ago.  Not regularly taking any medications other than antihypertensive.  Saw eye doctor last year.   Relevant past medical, surgical, family and social history reviewed and updated as indicated. Interim medical history since our last visit reviewed. Allergies and medications reviewed and updated. Outpatient Medications Prior to Visit  Medication Sig Dispense Refill  . albuterol (PROAIR HFA) 108 (90 Base) MCG/ACT inhaler Inhale 2 puffs into the lungs every 4 (four) hours as needed. 1 Inhaler 3  . amLODipine (NORVASC) 5 MG tablet Take 1 tablet (5 mg total) by mouth daily. 90 tablet 3  . montelukast (SINGULAIR) 10 MG tablet Take 1 tablet (10 mg total) by mouth  1 day or 1 dose. 30 tablet 11  . omeprazole (PRILOSEC) 40 MG capsule Take 1 capsule (40 mg total) by mouth daily as needed (heartburn). 30 capsule 3  . fluticasone (FLONASE) 50 MCG/ACT nasal spray Place 2 sprays into both nostrils daily. 16 g 3   No facility-administered medications prior to visit.      Per HPI unless specifically indicated in ROS section below Review of Systems     Objective:    BP 118/76 (BP Location: Left Arm, Patient Position: Sitting, Cuff Size: Normal)   Pulse 69   Temp 98.3 F (36.8 C) (Oral)   Ht 5\' 2"  (1.575 m)   Wt 185 lb 8 oz (84.1 kg)   LMP 11/29/2017   SpO2 99%   BMI 33.93 kg/m   Wt Readings from Last 3 Encounters:  12/05/17 185 lb 8 oz (84.1 kg)  08/08/17 183 lb 12 oz (83.3 kg)  04/10/17 186 lb (84.4 kg)    Physical Exam  Constitutional: She appears well-developed and well-nourished. No distress.  HENT:  Head: Normocephalic and atraumatic.  Right Ear: Hearing, tympanic membrane, external ear and ear canal normal.  Left Ear: Hearing, tympanic membrane, external ear and ear canal normal.  Nose: Mucosal edema (with mucosal erythema) present. No rhinorrhea. Right sinus exhibits no maxillary sinus tenderness and no frontal sinus tenderness. Left sinus exhibits no maxillary sinus tenderness and no frontal sinus tenderness.  Mouth/Throat: Uvula is midline, oropharynx is  clear and moist and mucous membranes are normal. No oropharyngeal exudate, posterior oropharyngeal edema, posterior oropharyngeal erythema or tonsillar abscesses.  Eyes: Pupils are equal, round, and reactive to light. Conjunctivae and EOM are normal. No scleral icterus.  Neck: Normal range of motion. Neck supple.  Cardiovascular: Normal rate, regular rhythm, normal heart sounds and intact distal pulses.  No murmur heard. Pulmonary/Chest: Effort normal and breath sounds normal. No respiratory distress. She has no wheezes. She has no rales.  Lymphadenopathy:    She has no cervical  adenopathy.  Neurological: She is alert. She has normal strength. No cranial nerve deficit or sensory deficit. Coordination and gait normal.  CN 2-12 intact FTN intact EOMI  Skin: Skin is warm and dry. No rash noted.  Nursing note and vitals reviewed.  Results for orders placed or performed in visit on 37/62/83  Basic metabolic panel  Result Value Ref Range   Sodium 140 135 - 145 mEq/L   Potassium 4.0 3.5 - 5.1 mEq/L   Chloride 107 96 - 112 mEq/L   CO2 27 19 - 32 mEq/L   Glucose, Bld 101 (H) 70 - 99 mg/dL   BUN 8 6 - 23 mg/dL   Creatinine, Ser 0.74 0.40 - 1.20 mg/dL   Calcium 8.7 8.4 - 10.5 mg/dL   GFR 94.59 >60.00 mL/min  CBC with Differential/Platelet  Result Value Ref Range   WBC 7.7 4.0 - 10.5 K/uL   RBC 4.65 3.87 - 5.11 Mil/uL   Hemoglobin 13.6 12.0 - 15.0 g/dL   HCT 41.5 36.0 - 46.0 %   MCV 89.2 78.0 - 100.0 fl   MCHC 32.8 30.0 - 36.0 g/dL   RDW 14.1 11.5 - 15.5 %   Platelets 268.0 150.0 - 400.0 K/uL   Neutrophils Relative % 62.6 43.0 - 77.0 %   Lymphocytes Relative 26.2 12.0 - 46.0 %   Monocytes Relative 7.2 3.0 - 12.0 %   Eosinophils Relative 3.8 0.0 - 5.0 %   Basophils Relative 0.2 0.0 - 3.0 %   Neutro Abs 4.8 1.4 - 7.7 K/uL   Lymphs Abs 2.0 0.7 - 4.0 K/uL   Monocytes Absolute 0.6 0.1 - 1.0 K/uL   Eosinophils Absolute 0.3 0.0 - 0.7 K/uL   Basophils Absolute 0.0 0.0 - 0.1 K/uL  TSH  Result Value Ref Range   TSH 1.92 0.35 - 4.50 uIU/mL      Assessment & Plan:  Over 25 minutes were spent face-to-face with the patient during this encounter and >50% of that time was spent on counseling and coordination of care  Problem List Items Addressed This Visit    Essential hypertension    BP well controlled today.       Acute nonintractable headache - Primary    Acute activity limiting headache yesterday associated with some nausea and photophobia. Normal neurological exam today. ?developing migraine. Treat with toradol shot today, flexeril abortively. Update with  effect. Consider head imaging if no improvement with treatment. No need for preventative medication at this time. Handout provided today.       Relevant Medications   cyclobenzaprine (FLEXERIL) 5 MG tablet   ketorolac (TORADOL) 30 MG/ML injection 30 mg (Completed) (Start on 12/05/2017 10:45 AM)    Other Visit Diagnoses    Need for influenza vaccination       Relevant Orders   Flu Vaccine QUAD 36+ mos IM (Completed)       Meds ordered this encounter  Medications  . cyclobenzaprine (FLEXERIL) 5 MG tablet  Sig: Take 1 tablet (5 mg total) by mouth 2 (two) times daily as needed (headache (sedation precautions)).    Dispense:  30 tablet    Refill:  1  . ketorolac (TORADOL) 30 MG/ML injection 30 mg   Orders Placed This Encounter  Procedures  . Flu Vaccine QUAD 36+ mos IM    Follow up plan: Return if symptoms worsen or fail to improve.  Ria Bush, MD

## 2017-12-05 NOTE — Assessment & Plan Note (Signed)
BP well controlled today.

## 2017-12-05 NOTE — Patient Instructions (Addendum)
Flu shot today Possible migraine.  Shot of toradol today then may try flexeril muscle relaxant as needed for migraine (may make you sleepy). Let us know if this doesn't help.  Migraine Headache A migraine headache is an intense, throbbing pain on one side or both sides of the head. Migraines may also cause other symptoms, such as nausea, vomiting, and sensitivity to light and noise. What are the causes? Doing or taking certain things may also trigger migraines, such as:  Alcohol.  Smoking.  Medicines, such as: ? Medicine used to treat chest pain (nitroglycerine). ? Birth control pills. ? Estrogen pills. ? Certain blood pressure medicines.  Aged cheeses, chocolate, or caffeine.  Foods or drinks that contain nitrates, glutamate, aspartame, or tyramine.  Physical activity.  Other things that may trigger a migraine include:  Menstruation.  Pregnancy.  Hunger.  Stress, lack of sleep, too much sleep, or fatigue.  Weather changes.  What increases the risk? The following factors may make you more likely to experience migraine headaches:  Age. Risk increases with age.  Family history of migraine headaches.  Being Caucasian.  Depression and anxiety.  Obesity.  Being a woman.  Having a hole in the heart (patent foramen ovale) or other heart problems.  What are the signs or symptoms? The main symptom of this condition is pulsating or throbbing pain. Pain may:  Happen in any area of the head, such as on one side or both sides.  Interfere with daily activities.  Get worse with physical activity.  Get worse with exposure to bright lights or loud noises.  Other symptoms may include:  Nausea.  Vomiting.  Dizziness.  General sensitivity to bright lights, loud noises, or smells.  Before you get a migraine, you may get warning signs that a migraine is developing (aura). An aura may include:  Seeing flashing lights or having blind spots.  Seeing bright  spots, halos, or zigzag lines.  Having tunnel vision or blurred vision.  Having numbness or a tingling feeling.  Having trouble talking.  Having muscle weakness.  How is this diagnosed? A migraine headache can be diagnosed based on:  Your symptoms.  A physical exam.  Tests, such as CT scan or MRI of the head. These imaging tests can help rule out other causes of headaches.  Taking fluid from the spine (lumbar puncture) and analyzing it (cerebrospinal fluid analysis, or CSF analysis).  How is this treated? A migraine headache is usually treated with medicines that:  Relieve pain.  Relieve nausea.  Prevent migraines from coming back.  Treatment may also include:  Acupuncture.  Lifestyle changes like avoiding foods that trigger migraines.  Follow these instructions at home: Medicines  Take over-the-counter and prescription medicines only as told by your health care provider.  Do not drive or use heavy machinery while taking prescription pain medicine.  To prevent or treat constipation while you are taking prescription pain medicine, your health care provider may recommend that you: ? Drink enough fluid to keep your urine clear or pale yellow. ? Take over-the-counter or prescription medicines. ? Eat foods that are high in fiber, such as fresh fruits and vegetables, whole grains, and beans. ? Limit foods that are high in fat and processed sugars, such as fried and sweet foods. Lifestyle  Avoid alcohol use.  Do not use any products that contain nicotine or tobacco, such as cigarettes and e-cigarettes. If you need help quitting, ask your health care provider.  Get at least 8 hours of  sleep every night.  Limit your stress. General instructions   Keep a journal to find out what may trigger your migraine headaches. For example, write down: ? What you eat and drink. ? How much sleep you get. ? Any change to your diet or medicines.  If you have a  migraine: ? Avoid things that make your symptoms worse, such as bright lights. ? It may help to lie down in a dark, quiet room. ? Do not drive or use heavy machinery. ? Ask your health care provider what activities are safe for you while you are experiencing symptoms.  Keep all follow-up visits as told by your health care provider. This is important. Contact a health care provider if:  You develop symptoms that are different or more severe than your usual migraine symptoms. Get help right away if:  Your migraine becomes severe.  You have a fever.  You have a stiff neck.  You have vision loss.  Your muscles feel weak or like you cannot control them.  You start to lose your balance often.  You develop trouble walking.  You faint. This information is not intended to replace advice given to you by your health care provider. Make sure you discuss any questions you have with your health care provider. Document Released: 02/12/2005 Document Revised: 09/02/2015 Document Reviewed: 08/01/2015 Elsevier Interactive Patient Education  2017 [employer].

## 2017-12-11 ENCOUNTER — Ambulatory Visit: Payer: Federal, State, Local not specified - PPO | Admitting: Allergy

## 2017-12-11 ENCOUNTER — Encounter: Payer: Self-pay | Admitting: Allergy

## 2017-12-11 VITALS — BP 118/72 | HR 80 | Temp 98.0°F | Resp 20 | Ht 63.0 in | Wt 187.2 lb

## 2017-12-11 DIAGNOSIS — T781XXD Other adverse food reactions, not elsewhere classified, subsequent encounter: Secondary | ICD-10-CM

## 2017-12-11 DIAGNOSIS — J4520 Mild intermittent asthma, uncomplicated: Secondary | ICD-10-CM | POA: Diagnosis not present

## 2017-12-11 DIAGNOSIS — J302 Other seasonal allergic rhinitis: Secondary | ICD-10-CM

## 2017-12-11 DIAGNOSIS — J3089 Other allergic rhinitis: Secondary | ICD-10-CM | POA: Diagnosis not present

## 2017-12-11 MED ORDER — MOMETASONE FUROATE 50 MCG/ACT NA SUSP: 2.0000 | Freq: Every day | NASAL | 5 refills | Status: DC

## 2017-12-11 MED ORDER — ALBUTEROL SULFATE HFA 108 (90 BASE) MCG/ACT IN AERS: 2.0000 | INHALATION_SPRAY | RESPIRATORY_TRACT | 1 refills | Status: DC | PRN

## 2017-12-11 MED ORDER — MONTELUKAST SODIUM 10 MG PO TABS: 10.0000 mg | ORAL_TABLET | Freq: Every day | ORAL | 5 refills | Status: DC

## 2017-12-11 MED ORDER — EPINEPHRINE 0.3 MG/0.3ML IJ SOAJ: INTRAMUSCULAR | 1 refills | Status: DC

## 2017-12-11 NOTE — Assessment & Plan Note (Addendum)
History of asthma as a child. No recent albuterol use. Today's spirometry was normal.  May use albuterol rescue inhaler 2 puffs or nebulizer every 4 to 6 hours as needed for shortness of breath, chest tightness, coughing, and wheezing. May use albuterol rescue inhaler 2 puffs 5 to 15 minutes prior to strenuous physical activities.  Monitor symptoms.

## 2017-12-11 NOTE — Progress Notes (Addendum)
Follow Up Patient Note  RE: Sabrina Smith MRN: [id number] DOB: 1981/01/24 Date of Office Visit: 12/11/2017  Referring provider: Ria Bush, MD Primary care provider: Ria Bush, MD  Chief Complaint: New Patient (Initial Visit) (needs to restart allergy shots; )  History of Present Illness: I had the pleasure of seeing Sabrina Smith for initial evaluation at the [facility] [place] [facility] [place] on 12/11/2017. She is a 37 y.o. female, who is referred here by ENT for the evaluation of re-establishing care and restarting allergy injections. Patient was followed by Dr. Shaune Leeks for allergic rhinitis, asthma and oral allergy syndrome. Last OV was on 05/31/2014.  She reports symptoms of nasal congestion, PND, sinus infections, itchy/watery eyes. Symptoms have been going on for 20 years. The symptoms are present all year around with worsening in spring. Other triggers include exposure to none. Anosmia: no. Headache: sometimes. She has used Singulair, Claritin, zyrtec, allegra, xyzal, Flonase caused burning with some improvement in symptoms. Sinus infections: none in the past year. Previous work up includes: skin testing in 2014 was positive to grass, ragweed, weed, trees, mold, dust mites, horse and cockroach. Previous ENT evaluation: follows with ENT, last visit was 2 weeks ago. Previous sinus imaging: none.  Patient was on allergy immunotherapy - GT and WDM from 2016 to 2017. Stopped due to schedule issues. Noticed some benefit. No reactions with the allergy injections and wants to restart.  Assessment and Plan: Sabrina Smith is a 37 y.o. female with: Perennial allergic rhinitis with seasonal variation Perennial rhinoconjunctivitis symptoms for the past many years with worsening in the spring.  Patient used over-the-counter antihistamines, Singulair and Flonase as needed with some benefit.  2014 skin testing was positive to grass, ragweed, weeds, trees, mold, dust mites, horse and  cockroach.  She was on allergy immunotherapy from 2016-2017 (GT + WDM) but stopped due to scheduling issues. Patient follows with ENT. Patient would like to restart injections.  Today skin testing showed: positive grass, ragweed, weed, trees, mold, dust mites, cockroaches. Discussed environmental control measures.   Start allergy immunotherapy. Reviewed risks and benefits. Consent form was signed. AuviQ prescription sent in.  May use over the counter antihistamines such as Zyrtec (cetirizine), Claritin (loratadine), Allegra (fexofenadine), or Xyzal (levocetirizine) daily as needed.  Continue Singulair 10mg  daily.  Start Nasonex 2 sprays daily. Demonstrated proper use.   Mild intermittent asthma History of asthma as a child. No recent albuterol use. Today's spirometry was normal.  May use albuterol rescue inhaler 2 puffs or nebulizer every 4 to 6 hours as needed for shortness of breath, chest tightness, coughing, and wheezing. May use albuterol rescue inhaler 2 puffs 5 to 15 minutes prior to strenuous physical activities.  Monitor symptoms.  Pollen-food allergy Oral pruritus with soy, fresh apples and certain tree nuts. 2014 skin testing was mildly positive to soy, almond and hazelnuts.  Continue to avoid tree nuts, soy and apples.  I have prescribed epinephrine injectable and demonstrated proper use. For mild symptoms you can take over the counter antihistamines such as Benadryl and monitor symptoms closely. If symptoms worsen or if you have severe symptoms including breathing issues, throat closure, significant swelling, whole body hives, severe diarrhea and vomiting, lightheadedness then inject epinephrine and seek immediate medical care afterwards.  Food action plan given.  Return in about 6 months (around 06/12/2018).  Meds ordered this encounter  Medications  . mometasone (NASONEX) 50 MCG/ACT nasal spray    Sig: Place 2 sprays into the nose daily.  Dispense:  17 g    Refill:   5  . EPINEPHrine (AUVI-Q) 0.3 mg/0.3 mL IJ SOAJ injection    Sig: Use as directed for severe allergic reaction    Dispense:  2 Device    Refill:  1  . albuterol (PROAIR HFA) 108 (90 Base) MCG/ACT inhaler    Sig: Inhale 2 puffs into the lungs every 4 (four) hours as needed.    Dispense:  1 Inhaler    Refill:  1  . montelukast (SINGULAIR) 10 MG tablet    Sig: Take 1 tablet (10 mg total) by mouth at bedtime.    Dispense:  30 tablet    Refill:  5   Other allergy screening: Asthma: yes  Had issues with asthma as a child.  Denies any SOB, coughing, wheezing, chest tightness, nocturnal awakenings, ER/urgent care visits or prednisone use since the last visit. No albuterol use since the last visit in 2016. 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 2014 skin testing was mildly positive to soy, almond and hazelnuts. Medication allergy: yes  Sulfa - hives Hymenoptera allergy: no Urticaria: no Eczema:no History of recurrent infections suggestive of immunodeficency: no  Diagnostics: Spirometry:  Tracings reviewed. Her effort: Good reproducible efforts. FVC: 2.86 L FEV1: 2.55 L, 101 % predicted FEV1/FVC ratio: 89 % Interpretation: Spirometry consistent with normal pattern.  Please see scanned spirometry results for details.  Skin Testing: Environmental allergies. Positive test to: grass, ragweed, weed, trees, mold, dust mites, cockroaches. Results discussed with patient/family. Airborne Adult Perc - 12/11/17 1445    Time Antigen Placed  1440    Allergen Manufacturer  Lavella Hammock    Location  Back    Number of Test  59    Panel 1  Select    1. Control-Buffer 50% Glycerol  Negative    2. Control-Histamine 1 mg/ml  4+    3. Albumin saline  Negative    4. [place]  4+    5. [place]  2+    6. Johnson  4+    7. [place]  4+    8. Meadow Fescue  4+    9. Perennial Rye  4+    10. Sweet Vernal  4+    11. Timothy  4+    12. Cocklebur   Negative    13. Burweed Marshelder  Negative    14. Ragweed, short  Negative    15. Ragweed, Giant  Negative    16. Plantain,  English  Negative    17. Lamb's Quarters  Negative    18. Sheep Sorrell  Negative    19. Rough Pigweed  Negative    20. Marsh Elder, Rough  Negative    21. Mugwort, Common  Negative    22. Ash mix  Negative    23. Birch mix  4+    24. Beech American  4+    25. Box, Elder  3+    26. Cedar, red  Negative    27. Cottonwood, [place]  Negative    28. Elm mix  Negative    29. Hickory mix  3+    30. Maple mix  4+    31. Oak, [place] mix  4+    32. Pecan Pollen  3+    33. Pine mix  Negative    34. Sycamore Eastern  Negative    35. [place], Black Pollen  Negative    36. Alternaria alternata  4+  37. Cladosporium Herbarum  Negative    38. Aspergillus mix  Negative    39. Penicillium mix  Negative    40. Bipolaris sorokiniana (Helminthosporium)  Negative    41. Drechslera spicifera (Curvularia)  Negative    42. Mucor plumbeus  Negative    43. Fusarium moniliforme  Negative    44. Aureobasidium pullulans (pullulara)  Negative    45. Rhizopus oryzae  Negative    46. Botrytis cinera  Negative    47. Epicoccum nigrum  2+    48. Phoma betae  Negative    49. Candida Albicans  Negative    50. Trichophyton mentagrophytes  Negative    51. Mite, D Farinae  5,000 AU/ml  2+    52. Mite, D Pteronyssinus  5,000 AU/ml  3+    53. Cat Hair 10,000 BAU/ml  Negative    54.  Dog Epithelia  Negative    55. Mixed Feathers  Negative    56. Horse Epithelia  Negative    57. Cockroach, German  Negative    58. Mouse  Negative    59. Tobacco Leaf  Negative     Intradermal - 12/11/17 1527    Time Antigen Placed  1520    Allergen Manufacturer  Lavella Hammock    Location  Arm    Number of Test  9    Intradermal  Select    Control  Negative    Ragweed mix  3+    Weed mix  3+    Mold 2  Negative    Mold 3  2+    Mold 4  3+    Cat  Negative    Dog  Negative    Cockroach  4+        Past Medical History: Patient Active Problem List   Diagnosis Date Noted  . Perennial allergic rhinitis with seasonal variation 12/11/2017  . Hyperhidrosis 04/19/2017  . Family history of thyroid disease 04/05/2017  . Essential hypertension 11/01/2016  . Acute nonintractable headache 11/01/2016  . GERD (gastroesophageal reflux disease) 09/21/2015  . Allergic rhinitis 10/28/2014  . Mild intermittent asthma 10/28/2014  . Multiple food allergies 10/28/2014  . Pollen-food allergy 10/28/2014  . Fatigue 12/08/2012  . Health care maintenance 04/11/2011   Past Medical History:  Diagnosis Date  . Allergic rhinitis    severe ([place])  . Asthma    as a child  . History of chicken pox   . Shingles 1990s   Past Surgical History: Past Surgical History:  Procedure Laterality Date  . NO PAST SURGERIES     Medication List:  Current Outpatient Medications  Medication Sig Dispense Refill  . albuterol (PROAIR HFA) 108 (90 Base) MCG/ACT inhaler Inhale 2 puffs into the lungs every 4 (four) hours as needed. 1 Inhaler 1  . amLODipine (NORVASC) 5 MG tablet Take 1 tablet (5 mg total) by mouth daily. 90 tablet 3  . omeprazole (PRILOSEC) 40 MG capsule Take 1 capsule (40 mg total) by mouth daily as needed (heartburn). 30 capsule 3  . cyclobenzaprine (FLEXERIL) 5 MG tablet Take 1 tablet (5 mg total) by mouth 2 (two) times daily as needed (headache (sedation precautions)). (Patient not taking: Reported on 12/11/2017) 30 tablet 1  . EPINEPHrine (AUVI-Q) 0.3 mg/0.3 mL IJ SOAJ injection Use as directed for severe allergic reaction 2 Device 1  . mometasone (NASONEX) 50 MCG/ACT nasal spray Place 2 sprays into the nose daily. 17 g 5  . montelukast (SINGULAIR) 10 MG tablet Take  1 tablet (10 mg total) by mouth at bedtime. 30 tablet 5   No current facility-administered medications for this visit.    Allergies: Allergies  Allergen Reactions  . Other     Soybean, Almond, Hazelnut  . Sulfa Drugs Cross  Reactors Hives   Social History: Social History   Socioeconomic History  . Marital status: Single    Spouse name: Not on file  . Number of children: Not on file  . Years of education: Not on file  . Highest education level: Not on file  Occupational History  . Occupation: Chemical engineer: [employer]  Social Needs  . Financial resource strain: Not on file  . Food insecurity:    Worry: Not on file    Inability: Not on file  . Transportation needs:    Medical: Not on file    Non-medical: Not on file  Tobacco Use  . Smoking status: Former Smoker    Last attempt to quit: 09/27/2010    Years since quitting: 7.2  . Smokeless tobacco: Never Used  Substance and Sexual Activity  . Alcohol use: Yes    Comment: Occasional  . Drug use: No  . Sexual activity: Yes    Birth control/protection: IUD  Lifestyle  . Physical activity:    Days per week: Not on file    Minutes per session: Not on file  . Stress: Not on file  Relationships  . Social connections:    Talks on phone: Not on file    Gets together: Not on file    Attends religious service: Not on file    Active member of club or organization: Not on file    Attends meetings of clubs or organizations: Not on file    Relationship status: Not on file  Other Topics Concern  . Not on file  Social History Narrative   Caffeine: none   Lives with daughter (2006), no pets   Occupation: Tour manager   Edu: bachelor's   Activity: treadmill and workout tapes   Diet: seldom water, fruits/vegetables daily, red meat rare, fish 3x/wk   Lives in a [place] year old townhouse with daughter Smoking: denies Occupation: Airline pilot History: Water Damage/mildew in the house: no Charity fundraiser in the family room: yes Carpet in the bedroom: yes Heating: electric Cooling: central Pet: no  Family History: Family History  Problem Relation Age of Onset  . Diabetes Maternal Aunt   . Hypertension Maternal Aunt   .  Diabetes Maternal Uncle   . Hypertension Maternal Uncle   . Stroke Maternal Uncle   . Heart disease Maternal Grandmother   . Diabetes Maternal Grandfather   . Cancer Maternal Grandfather        prostate  . Hypertension Mother   . Hypertension Brother   . Asthma Daughter   . Eczema Daughter   . Allergic rhinitis Neg Hx   . Urticaria Neg Hx    Problem                               Relation Asthma                                   Daughter  Eczema  Daughter  Food allergy                          No  Allergic rhino conjunctivitis     No   Review of Systems  Constitutional: Negative for appetite change, chills, fever and unexpected weight change.  HENT: Positive for congestion. Negative for rhinorrhea.   Eyes: Negative for itching.  Respiratory: Negative for cough, chest tightness, shortness of breath and wheezing.   Cardiovascular: Negative for chest pain.  Gastrointestinal: Negative for abdominal pain.  Genitourinary: Negative for difficulty urinating.  Skin: Negative for rash.  Allergic/Immunologic: Positive for environmental allergies and food allergies.  Neurological: Negative for headaches.   Objective: BP 118/72 (BP Location: Left Arm, Patient Position: Sitting, Cuff Size: Normal)   Pulse 80   Temp 98 F (36.7 C) (Oral)   Resp 20   Ht 5\' 3"  (1.6 m)   Wt 187 lb 3.2 oz (84.9 kg)   LMP 11/29/2017   BMI 33.16 kg/m  Body mass index is 33.16 kg/m. Physical Exam  Constitutional: She is oriented to person, place, and time. She appears well-developed and well-nourished.  HENT:  Head: Normocephalic and atraumatic.  Right Ear: External ear normal.  Left Ear: External ear normal.  Nose: Nose normal.  Mouth/Throat: Oropharynx is clear and moist.  Eyes: Conjunctivae and EOM are normal.  Neck: Neck supple.  Cardiovascular: Normal rate, regular rhythm and normal heart sounds. Exam reveals no gallop and no friction rub.  No murmur  heard. Pulmonary/Chest: Effort normal and breath sounds normal. She has no wheezes. She has no rales.  Abdominal: Soft. Bowel sounds are normal. There is no tenderness.  Lymphadenopathy:    She has no cervical adenopathy.  Neurological: She is alert and oriented to person, place, and time.  Skin: Skin is warm. No rash noted.  Psychiatric: She has a normal mood and affect. Her behavior is normal.  Nursing note and vitals reviewed.  The plan was reviewed with the patient/family, and all questions/concerned were addressed.  It was my pleasure to see Sabrina Smith today and participate in her care. Please feel free to contact me with any questions or concerns.  Sincerely,  Rexene Alberts, DO [facility] [place] [facility] office: [phone] [place] [phone]  Attestation: I was present as supervising physician for this patient encounter and agree with plan as outlined above by Dr. Barbie Banner, MD [facility] [place] [facility]

## 2017-12-11 NOTE — Patient Instructions (Addendum)
Perennial allergic rhinitis with seasonal variation Perennial rhinoconjunctivitis symptoms for the past many years with worsening in the spring.  Patient used over-the-counter antihistamines, Singulair and Flonase as needed with some benefit.  2014 skin testing was positive to grass, ragweed, weeds, trees, mold, dust mites, horse and cockroach.  She was on allergy immunotherapy from 2016-2017 (GT + WDM) but stopped due to scheduling issues. Patient follows with ENT. Patient would like to restart injections.  Today skin testing showed: positive grass, ragweed, weed, trees, mold, dust mites, cockroaches. Discussed environmental control measures.   Start allergy immunotherapy. Reviewed risks and benefits. Consent form was signed. AuviQ prescription sent in.  May use over the counter antihistamines such as Zyrtec (cetirizine), Claritin (loratadine), Allegra (fexofenadine), or Xyzal (levocetirizine) daily as needed.  Continue Singulair 10mg  daily.  Start Nasonex 2 sprays daily. Demonstrated proper use.   Mild intermittent asthma History of asthma as a child. No recent albuterol use. Today's spirometry was normal.  May use albuterol rescue inhaler 2 puffs or nebulizer every 4 to 6 hours as needed for shortness of breath, chest tightness, coughing, and wheezing. May use albuterol rescue inhaler 2 puffs 5 to 15 minutes prior to strenuous physical activities.  Monitor symptoms.  Pollen-food allergy Oral pruritus with soy, fresh apples and certain tree nuts. 2014 skin testing was mildly positive to soy, almond and hazelnuts.  Continue to avoid tree nuts, soy and apples.  I have prescribed epinephrine injectable and demonstrated proper use. For mild symptoms you can take over the counter antihistamines such as Benadryl and monitor symptoms closely. If symptoms worsen or if you have severe symptoms including breathing issues, throat closure, significant swelling, whole body hives, severe diarrhea and  vomiting, lightheadedness then inject epinephrine and seek immediate medical care afterwards.  Food action plan given.  Return in about 6 months (around 06/12/2018).

## 2017-12-11 NOTE — Assessment & Plan Note (Addendum)
Oral pruritus with soy, fresh apples and certain tree nuts. 2014 skin testing was mildly positive to soy, almond and hazelnuts.  Continue to avoid tree nuts, soy and apples.  I have prescribed epinephrine injectable and demonstrated proper use. For mild symptoms you can take over the counter antihistamines such as Benadryl and monitor symptoms closely. If symptoms worsen or if you have severe symptoms including breathing issues, throat closure, significant swelling, whole body hives, severe diarrhea and vomiting, lightheadedness then inject epinephrine and seek immediate medical care afterwards.  Food action plan given.

## 2017-12-11 NOTE — Assessment & Plan Note (Addendum)
Perennial rhinoconjunctivitis symptoms for the past many years with worsening in the spring.  Patient used over-the-counter antihistamines, Singulair and Flonase as needed with some benefit.  2014 skin testing was positive to grass, ragweed, weeds, trees, mold, dust mites, horse and cockroach.  She was on allergy immunotherapy from 2016-2017 (GT + WDM) but stopped due to scheduling issues. Patient follows with ENT. Patient would like to restart injections.  Today skin testing showed: positive grass, ragweed, weed, trees, mold, dust mites, cockroaches. Discussed environmental control measures.   Start allergy immunotherapy. Reviewed risks and benefits. Consent form was signed. AuviQ prescription sent in.  May use over the counter antihistamines such as Zyrtec (cetirizine), Claritin (loratadine), Allegra (fexofenadine), or Xyzal (levocetirizine) daily as needed.  Continue Singulair 10mg  daily.  Start Nasonex 2 sprays daily. Demonstrated proper use.

## 2017-12-12 NOTE — Addendum Note (Signed)
Addended by: Garnet Sierras on: 12/12/2017 12:21 PM   Modules accepted: Orders

## 2018-01-15 ENCOUNTER — Ambulatory Visit (INDEPENDENT_AMBULATORY_CARE_PROVIDER_SITE_OTHER): Payer: Federal, State, Local not specified - PPO | Admitting: Family Medicine

## 2018-01-15 ENCOUNTER — Encounter: Payer: Self-pay | Admitting: Family Medicine

## 2018-01-15 VITALS — BP 116/70 | HR 75 | Temp 98.8°F | Ht 62.0 in | Wt 190.8 lb

## 2018-01-15 DIAGNOSIS — J3089 Other allergic rhinitis: Secondary | ICD-10-CM | POA: Diagnosis not present

## 2018-01-15 DIAGNOSIS — I10 Essential (primary) hypertension: Secondary | ICD-10-CM

## 2018-01-15 DIAGNOSIS — M533 Sacrococcygeal disorders, not elsewhere classified: Secondary | ICD-10-CM | POA: Insufficient documentation

## 2018-01-15 MED ORDER — METHOCARBAMOL 500 MG PO TABS: 500.0000 mg | ORAL_TABLET | Freq: Two times a day (BID) | ORAL | 0 refills | Status: DC | PRN

## 2018-01-15 NOTE — Progress Notes (Signed)
BP 116/70 (BP Location: Left Arm, Patient Position: Sitting, Cuff Size: Normal)   Pulse 75   Temp 98.8 F (37.1 C) (Oral)   Ht 5\' 2"  (1.575 m)   Wt 190 lb 12 oz (86.5 kg)   LMP 12/21/2017   SpO2 98%   BMI 34.89 kg/m    CC: tailbone pain Subjective:    Patient ID: Sabrina Smith, female    DOB: [birth year], 37 y.o.   MRN: [id number]  HPI: KALE DOLS is a 37 y.o. female presenting on 01/15/2018 for Tailbone Pain (C/o tailbone pain for past few weeks. Pain occurs after sitting for awhile, especially at work. Denies any injury. )   Several week h/o tailbone pain after prolonged sitting.  Denies inciting trauma or injury or falls.  Denies rash or other skin changes. No fever or malaise.  Never had anything like this before.  Treating with heating pad without improvement.  45 min drive to work. Sits on elevated stool for stand-up desk. New location for work over last 3 months.   Planning to start allergy shots.  She has not been taking BP med but not checking BP at home.  Headaches have improved.  Relevant past medical, surgical, family and social history reviewed and updated as indicated. Interim medical history since our last visit reviewed. Allergies and medications reviewed and updated. Outpatient Medications Prior to Visit  Medication Sig Dispense Refill  . albuterol (PROAIR HFA) 108 (90 Base) MCG/ACT inhaler Inhale 2 puffs into the lungs every 4 (four) hours as needed. 1 Inhaler 1  . amLODipine (NORVASC) 5 MG tablet Take 1 tablet (5 mg total) by mouth daily. 90 tablet 3  . cyclobenzaprine (FLEXERIL) 5 MG tablet Take 1 tablet (5 mg total) by mouth 2 (two) times daily as needed (headache (sedation precautions)). 30 tablet 1  . EPINEPHrine (AUVI-Q) 0.3 mg/0.3 mL IJ SOAJ injection Use as directed for severe allergic reaction 2 Device 1  . mometasone (NASONEX) 50 MCG/ACT nasal spray Place 2 sprays into the nose daily. 17 g 5  . montelukast (SINGULAIR) 10 MG tablet Take 1  tablet (10 mg total) by mouth at bedtime. 30 tablet 5  . omeprazole (PRILOSEC) 40 MG capsule Take 1 capsule (40 mg total) by mouth daily as needed (heartburn). 30 capsule 3   No facility-administered medications prior to visit.      Per HPI unless specifically indicated in ROS section below Review of Systems     Objective:    BP 116/70 (BP Location: Left Arm, Patient Position: Sitting, Cuff Size: Normal)   Pulse 75   Temp 98.8 F (37.1 C) (Oral)   Ht 5\' 2"  (1.575 m)   Wt 190 lb 12 oz (86.5 kg)   LMP 12/21/2017   SpO2 98%   BMI 34.89 kg/m   Wt Readings from Last 3 Encounters:  01/15/18 190 lb 12 oz (86.5 kg)  12/11/17 187 lb 3.2 oz (84.9 kg)  12/05/17 185 lb 8 oz (84.1 kg)    Physical Exam  Constitutional: She appears well-developed and well-nourished. No distress.  Musculoskeletal: She exhibits no edema.  No pain midline spine, mild discomfort to palpation at sacral area and coccyx No significant paraspinous mm tenderness Neg SLR bilaterally. No pain with int/ext rotation at hip. Neg FABER. No pain at SIJ, GTB or sciatic notch bilaterally.   Neurological: She is alert.  Skin: Skin is warm and dry. Capillary refill takes less than 2 seconds. No rash noted. No erythema.  Psychiatric: She has a normal mood and affect.  Nursing note and vitals reviewed.     Assessment & Plan:   Problem List Items Addressed This Visit    Tail bone pain - Primary    No signs of pilonidal cyst.  Mild discomfort on palpation around coccyx. This corresponds temporally to new job - longer commute, new stool use. Discussed good ergonomics at work.  Anticipate muscular referred pain - will treat with muscle relaxant, short NSAID course, and continue heating pad. If no better, consider pelvic floor rehab. Consider imaging. Pt agrees with plan.       Relevant Medications   methocarbamol (ROBAXIN) 500 MG tablet   Essential hypertension    Stable period off antihypertensive - advised monitor BP  at home to ensure staying well controlled.          Meds ordered this encounter  Medications  . methocarbamol (ROBAXIN) 500 MG tablet    Sig: Take 1 tablet (500 mg total) by mouth 2 (two) times daily as needed for muscle spasms (sedation precautions).    Dispense:  40 tablet    Refill:  0   No orders of the defined types were placed in this encounter.   Follow up plan: No follow-ups on file.  Ria Bush, MD

## 2018-01-15 NOTE — Assessment & Plan Note (Addendum)
No signs of pilonidal cyst.  Mild discomfort on palpation around coccyx. This corresponds temporally to new job - longer commute, new stool use. Discussed good ergonomics at work.  Anticipate muscular referred pain - will treat with muscle relaxant, short NSAID course, and continue heating pad. If no better, consider pelvic floor rehab. Consider imaging. Pt agrees with plan.

## 2018-01-15 NOTE — Assessment & Plan Note (Signed)
Stable period off antihypertensive - advised monitor BP at home to ensure staying well controlled.

## 2018-01-15 NOTE — Patient Instructions (Addendum)
Start monitoring blood pressures more regularly to ensure well controlled - ideally <130/90.  I think this is more muscular pain from new chair/stool and longer car rides for work now. Treat with ibuprofen 400mg  twice daily with food and robaxin muscle relaxant at bedtime. Continue heating pad. Work on Personal assistant at work and on drive home (supportive seats).  If not improved with above, let me know for physical therapy referral.

## 2018-01-15 NOTE — Progress Notes (Signed)
EXP. 01/16/19

## 2018-01-16 DIAGNOSIS — J301 Allergic rhinitis due to pollen: Secondary | ICD-10-CM | POA: Diagnosis not present

## 2018-01-22 ENCOUNTER — Ambulatory Visit (INDEPENDENT_AMBULATORY_CARE_PROVIDER_SITE_OTHER): Payer: Federal, State, Local not specified - PPO | Admitting: *Deleted

## 2018-01-22 ENCOUNTER — Telehealth: Payer: Self-pay | Admitting: Family Medicine

## 2018-01-22 DIAGNOSIS — J309 Allergic rhinitis, unspecified: Secondary | ICD-10-CM

## 2018-01-22 DIAGNOSIS — M533 Sacrococcygeal disorders, not elsewhere classified: Secondary | ICD-10-CM

## 2018-01-22 NOTE — Telephone Encounter (Signed)
Please advise 

## 2018-01-22 NOTE — Telephone Encounter (Signed)
Pt called office stating she was seen last week and was told by Dr.Gutierrez if the medication is not helping then to call back so that he can put a order in to be referred to a PT. Pt is wanting to be referred out.

## 2018-01-22 NOTE — Progress Notes (Signed)
Immunotherapy   Patient Details  Name: Sabrina Smith MRN: [id number] Date of Birth: 1981/01/15  01/22/2018  Sabrina Smith started injections for  Mold-Cockroach-Dmite & Grass-Ragweed-Weed-Tree. Following schedule: B  Frequency:2 times per week Epi-Pen:Epi-Pen Available  Consent signed and patient instructions given. No problems after 30 minutes in the office.   [name] 01/22/2018, 10:42 AM

## 2018-01-26 NOTE — Telephone Encounter (Signed)
Come in for xray - ordered. Any chances of pregnancy prior to xray? When was last cycle? I have also ordered physical therapy for her.

## 2018-01-27 NOTE — Telephone Encounter (Signed)
Pt returning your phone call

## 2018-01-27 NOTE — Telephone Encounter (Signed)
Left message on vm per dpr relaying Dr. G's message. Asked pt to call back with answers to Dr. G's questions. 

## 2018-01-28 ENCOUNTER — Ambulatory Visit (INDEPENDENT_AMBULATORY_CARE_PROVIDER_SITE_OTHER)
Admission: RE | Admit: 2018-01-28 | Discharge: 2018-01-28 | Disposition: A | Discharge status: Home / Self Care | Payer: Federal, State, Local not specified - PPO | Source: Ambulatory Visit | Attending: Family Medicine | Admitting: Family Medicine

## 2018-01-28 DIAGNOSIS — M533 Sacrococcygeal disorders, not elsewhere classified: Secondary | ICD-10-CM

## 2018-01-28 NOTE — Telephone Encounter (Signed)
Spoke with pt informing her Dr. Darnell Level is wanting an x-ray first and that she can come in anytime today before 4:30. Verbalizes understanding. Also, pt denies being pregnant and states LMP is today.

## 2018-01-29 ENCOUNTER — Ambulatory Visit (INDEPENDENT_AMBULATORY_CARE_PROVIDER_SITE_OTHER): Payer: Federal, State, Local not specified - PPO | Admitting: *Deleted

## 2018-01-29 DIAGNOSIS — J309 Allergic rhinitis, unspecified: Secondary | ICD-10-CM | POA: Diagnosis not present

## 2018-02-03 ENCOUNTER — Ambulatory Visit (INDEPENDENT_AMBULATORY_CARE_PROVIDER_SITE_OTHER): Payer: Federal, State, Local not specified - PPO

## 2018-02-03 DIAGNOSIS — J309 Allergic rhinitis, unspecified: Secondary | ICD-10-CM

## 2018-02-05 ENCOUNTER — Ambulatory Visit (INDEPENDENT_AMBULATORY_CARE_PROVIDER_SITE_OTHER): Payer: Federal, State, Local not specified - PPO | Admitting: *Deleted

## 2018-02-05 DIAGNOSIS — J309 Allergic rhinitis, unspecified: Secondary | ICD-10-CM

## 2018-02-05 DIAGNOSIS — K5900 Constipation, unspecified: Secondary | ICD-10-CM | POA: Diagnosis not present

## 2018-02-05 DIAGNOSIS — K921 Melena: Secondary | ICD-10-CM | POA: Diagnosis not present

## 2018-02-05 DIAGNOSIS — R109 Unspecified abdominal pain: Secondary | ICD-10-CM | POA: Diagnosis not present

## 2018-02-12 ENCOUNTER — Ambulatory Visit: Payer: Federal, State, Local not specified - PPO | Admitting: Family Medicine

## 2018-02-12 ENCOUNTER — Encounter: Payer: Self-pay | Admitting: Family Medicine

## 2018-02-12 ENCOUNTER — Ambulatory Visit (INDEPENDENT_AMBULATORY_CARE_PROVIDER_SITE_OTHER): Payer: Federal, State, Local not specified - PPO | Admitting: *Deleted

## 2018-02-12 VITALS — BP 130/84 | HR 66 | Temp 98.2°F | Ht 62.0 in | Wt 188.8 lb

## 2018-02-12 DIAGNOSIS — R1013 Epigastric pain: Secondary | ICD-10-CM | POA: Diagnosis not present

## 2018-02-12 DIAGNOSIS — I10 Essential (primary) hypertension: Secondary | ICD-10-CM

## 2018-02-12 DIAGNOSIS — M533 Sacrococcygeal disorders, not elsewhere classified: Secondary | ICD-10-CM

## 2018-02-12 DIAGNOSIS — J309 Allergic rhinitis, unspecified: Secondary | ICD-10-CM | POA: Diagnosis not present

## 2018-02-12 NOTE — Assessment & Plan Note (Signed)
BP stable - not currently using amlodipine.

## 2018-02-12 NOTE — Progress Notes (Signed)
BP 130/84 (BP Location: Left Arm, Patient Position: Sitting, Cuff Size: Normal)   Pulse 66   Temp 98.2 F (36.8 C) (Oral)   Ht 5\' 2"  (1.575 m)   Wt 188 lb 12 oz (85.6 kg)   LMP 01/27/2018   SpO2 100%   BMI 34.52 kg/m    CC: tailbone pain Subjective:    Patient ID: Sabrina Smith, female    DOB: 31-Mar-1980, 37 y.o.   MRN: [id number]  HPI: Sabrina Smith is a 37 y.o. female presenting on 02/12/2018 for Tailbone Pain (States she still has tailbone pain for over 1 mo. ) and Abdominal Pain (C/o right side abd pain. Pain feels like cramps. Started having constipation and saw blood in stool about 1 mo ago. Has appt with GI 03/19/17. Now has also noticed abd bloating.  )   See prior note for details. Last visit we referred to physical therapy for coccydynia - PT she was referred to did not do pelvic floor therapy. Referred to integrative therapies - she has not scheduled appt yet. Ibuprofen hasn't helped. Muscle relaxant was overly sedating.   Also endorsing daily bilateral upper abd cramping pain, bloating, constipation and noted blood in commode 1 month ago. Cramping can improve with BM. Seeing [facility] for this ([facility]) - colonoscopy scheduled 03/19/2017. Hasn't noticed hemorrhoids. Doesn't notice food triggers. No unexpected weight loss. No fevers/chills, vomiting. Alternates diarrhea/constipation. No diet changes recently. No UTI sxs. No boring pain to back.   Last well woman exam mid 2018 (Dr Kenton Kingfisher).  Brother has IBS. Aunt has h/o diverticulitis.  Last LMP early 01/2018.  Not taking amlodipine.   Relevant past medical, surgical, family and social history reviewed and updated as indicated. Interim medical history since our last visit reviewed. Allergies and medications reviewed and updated. Outpatient Medications Prior to Visit  Medication Sig Dispense Refill  . albuterol (PROAIR HFA) 108 (90 Base) MCG/ACT inhaler Inhale 2 puffs into the lungs every 4 (four) hours as needed. 1  Inhaler 1  . amLODipine (NORVASC) 5 MG tablet Take 1 tablet (5 mg total) by mouth daily. 90 tablet 3  . cyclobenzaprine (FLEXERIL) 5 MG tablet Take 1 tablet (5 mg total) by mouth 2 (two) times daily as needed (headache (sedation precautions)). 30 tablet 1  . EPINEPHrine (AUVI-Q) 0.3 mg/0.3 mL IJ SOAJ injection Use as directed for severe allergic reaction 2 Device 1  . mometasone (NASONEX) 50 MCG/ACT nasal spray Place 2 sprays into the nose daily. 17 g 5  . montelukast (SINGULAIR) 10 MG tablet Take 1 tablet (10 mg total) by mouth at bedtime. 30 tablet 5  . omeprazole (PRILOSEC) 40 MG capsule Take 1 capsule (40 mg total) by mouth daily as needed (heartburn). 30 capsule 3  . methocarbamol (ROBAXIN) 500 MG tablet Take 1 tablet (500 mg total) by mouth 2 (two) times daily as needed for muscle spasms (sedation precautions). 40 tablet 0   No facility-administered medications prior to visit.      Per HPI unless specifically indicated in ROS section below Review of Systems     Objective:    BP 130/84 (BP Location: Left Arm, Patient Position: Sitting, Cuff Size: Normal)   Pulse 66   Temp 98.2 F (36.8 C) (Oral)   Ht 5\' 2"  (1.575 m)   Wt 188 lb 12 oz (85.6 kg)   LMP 01/27/2018   SpO2 100%   BMI 34.52 kg/m   Wt Readings from Last 3 Encounters:  02/12/18 188 lb  12 oz (85.6 kg)  01/15/18 190 lb 12 oz (86.5 kg)  12/11/17 187 lb 3.2 oz (84.9 kg)    Physical Exam Vitals signs and nursing note reviewed.  Constitutional:      General: She is not in acute distress.    Appearance: She is well-developed.  HENT:     Mouth/Throat:     Mouth: Mucous membranes are moist.     Pharynx: Oropharynx is clear.  Cardiovascular:     Rate and Rhythm: Normal rate and regular rhythm.     Pulses: Normal pulses.     Heart sounds: Normal heart sounds. No murmur.  Pulmonary:     Effort: Pulmonary effort is normal. No respiratory distress.     Breath sounds: Normal breath sounds. No wheezing, rhonchi or rales.    Abdominal:     General: Abdomen is flat. Bowel sounds are normal. There is no distension.     Palpations: Abdomen is soft. There is no hepatomegaly, splenomegaly or mass.     Tenderness: There is abdominal tenderness (mild) in the epigastric area. There is no right CVA tenderness, left CVA tenderness, guarding or rebound. Negative signs include Murphy's sign.     Hernia: No hernia is present.  Musculoskeletal:     Right lower leg: No edema.     Left lower leg: No edema.     Comments: No pain midline spine No paraspinous mm tenderness Neg seated SLR bilaterally. No pain with int/ext rotation at hip. No pain at SIJ, GTB or sciatic notch bilaterally.  No reproducible pain to palpation at coccyx  Neurological:     Mental Status: She is alert.  Psychiatric:        Mood and Affect: Mood normal.    DG Sacrum/Coccyx CLINICAL DATA:  One month history of sacrococcygeal pain. No known injury.  EXAM: SACRUM AND COCCYX - 2+ VIEW  COMPARISON:  None.  FINDINGS: No evidence of acute, subacute or healed fractures. Sacroiliac joints intact without significant degenerative change. Visualized LOWER lumbar spine unremarkable. No focal osseous abnormality involving the bony pelvis.  IMPRESSION: Normal examination.  Electronically Signed   By: Evangeline Dakin M.D.   On: 01/29/2018 09:37      Assessment & Plan:   Problem List Items Addressed This Visit    Essential hypertension    BP stable - not currently using amlodipine.       Coccydynia - Primary    Overall benign exam.  Suggested she see integrative therapies (recommendation by initial PT at [facility]).  Stop NSAID, muscle relaxant as not beneficial.      Abdominal discomfort, epigastric    Possibly NSAID induced stomach irritation - will stop NSAID. rec omeprazole 40mg  daily x 3 wks then PRN. ?IBS given alternating bowels and cramping nature of discomfort that can improve with BM - rec trial [place] or align daily.   She has upcoming colonoscopy scheduled ([facility]) for recent episode of blood in stool.           No orders of the defined types were placed in this encounter.  No orders of the defined types were placed in this encounter.   Follow up plan: Return if symptoms worsen or fail to improve.  Ria Bush, MD

## 2018-02-12 NOTE — Patient Instructions (Signed)
For abdominal discomfort - ibuprofen may have irritated stomach lining. Start omeprazole 40mg  daily for 3 weeks then as needed. Try probiotic like activia or align over the counter.  For lower back pain - I do want you to see integrative therapies for an evaluation.  Update me with their evaluation.

## 2018-02-12 NOTE — Assessment & Plan Note (Addendum)
Overall benign exam.  Suggested she see integrative therapies (recommendation by initial PT at [place]).  Stop NSAID, muscle relaxant as not beneficial.

## 2018-02-12 NOTE — Addendum Note (Signed)
Addended by: Ria Bush on: 02/12/2018 02:44 PM   Modules accepted: Orders

## 2018-02-12 NOTE — Assessment & Plan Note (Signed)
Possibly NSAID induced stomach irritation - will stop NSAID. rec omeprazole 40mg  daily x 3 wks then PRN. ?IBS given alternating bowels and cramping nature of discomfort that can improve with BM - rec trial [place] or align daily.  She has upcoming colonoscopy scheduled ([facility]) for recent episode of blood in stool.

## 2018-02-13 ENCOUNTER — Encounter: Payer: Self-pay | Admitting: Emergency Medicine

## 2018-02-13 ENCOUNTER — Emergency Department
Admission: EM | Admit: 2018-02-13 | Discharge: 2018-02-14 | Disposition: A | Discharge status: Home / Self Care | Payer: Federal, State, Local not specified - PPO | Attending: Emergency Medicine | Admitting: Emergency Medicine

## 2018-02-13 ENCOUNTER — Emergency Department: Payer: Federal, State, Local not specified - PPO

## 2018-02-13 ENCOUNTER — Other Ambulatory Visit: Payer: Self-pay

## 2018-02-13 DIAGNOSIS — J4520 Mild intermittent asthma, uncomplicated: Secondary | ICD-10-CM | POA: Insufficient documentation

## 2018-02-13 DIAGNOSIS — R079 Chest pain, unspecified: Secondary | ICD-10-CM | POA: Diagnosis not present

## 2018-02-13 DIAGNOSIS — R0789 Other chest pain: Secondary | ICD-10-CM | POA: Insufficient documentation

## 2018-02-13 DIAGNOSIS — I10 Essential (primary) hypertension: Secondary | ICD-10-CM | POA: Insufficient documentation

## 2018-02-13 DIAGNOSIS — Z87891 Personal history of nicotine dependence: Secondary | ICD-10-CM | POA: Diagnosis not present

## 2018-02-13 DIAGNOSIS — R1013 Epigastric pain: Secondary | ICD-10-CM

## 2018-02-13 DIAGNOSIS — Z79899 Other long term (current) drug therapy: Secondary | ICD-10-CM | POA: Diagnosis not present

## 2018-02-13 DIAGNOSIS — R0602 Shortness of breath: Secondary | ICD-10-CM | POA: Diagnosis not present

## 2018-02-13 LAB — CBC
HCT: 43.2 % (ref 36.0–46.0)
Hemoglobin: 13.9 g/dL (ref 12.0–15.0)
MCH: 28.2 pg (ref 26.0–34.0)
MCHC: 32.2 g/dL (ref 30.0–36.0)
MCV: 87.6 fL (ref 80.0–100.0)
Platelets: 266 10*3/uL (ref 150–400)
RBC: 4.93 MIL/uL (ref 3.87–5.11)
RDW: 14.7 % (ref 11.5–15.5)
WBC: 10.9 10*3/uL — ABNORMAL HIGH (ref 4.0–10.5)
nRBC: 0 % (ref 0.0–0.2)

## 2018-02-13 LAB — BASIC METABOLIC PANEL
Anion gap: 8 (ref 5–15)
BUN: 10 mg/dL (ref 6–20)
CO2: 26 mmol/L (ref 22–32)
Calcium: 8.9 mg/dL (ref 8.9–10.3)
Chloride: 105 mmol/L (ref 98–111)
Creatinine, Ser: 0.72 mg/dL (ref 0.44–1.00)
GFR calc Af Amer: >60 mL/min (ref >60)
GFR calc non Af Amer: >60 mL/min (ref >60)
Glucose, Bld: 109 mg/dL — ABNORMAL HIGH (ref 70–99)
Potassium: 3.4 mmol/L — ABNORMAL LOW (ref 3.5–5.1)
Sodium: 139 mmol/L (ref 135–145)

## 2018-02-13 LAB — TROPONIN I: Troponin I: <0.03 ng/mL (ref <0.03)

## 2018-02-13 LAB — POCT PREGNANCY, URINE: Preg Test, Ur: NEGATIVE

## 2018-02-13 MED ORDER — SUCRALFATE 1 G PO TABS
1.0000 g | ORAL_TABLET | Freq: Once | ORAL | Status: AC
  Administered 2018-02-14: 1 g via ORAL
  Filled 2018-02-13: qty 1

## 2018-02-13 MED ORDER — MISOPROSTOL 200 MCG PO TABS
200.0000 ug | ORAL_TABLET | Freq: Once | ORAL | Status: AC
  Administered 2018-02-14: 200 ug via ORAL
  Filled 2018-02-13: qty 1

## 2018-02-13 MED ORDER — ALUM & MAG HYDROXIDE-SIMETH 200-200-20 MG/5ML PO SUSP
30.0000 mL | Freq: Once | ORAL | Status: AC
  Administered 2018-02-14: 30 mL via ORAL
  Filled 2018-02-13: qty 30

## 2018-02-13 NOTE — ED Provider Notes (Signed)
[facility] Emergency Department Provider Note  ____________________________________________   First MD Initiated Contact with Patient 02/13/18 2348     (approximate)  I have reviewed the triage vital signs and the nursing notes.   HISTORY  Chief Complaint Chest Pain   HPI Sabrina Smith is a 37 y.o. female who comes to the emergency department with lower chest discomfort that began earlier today.  The pain is sharp and seems to radiate around towards her back.  She is had no nausea, vomiting, diarrhea, shortness of breath, cough, fevers, or chills.  Her symptoms began while she was at work and she went to urgent care who advised her to come to the emergency department for further evaluation.  Her symptoms came on gradually.  They were not ripping or tearing.  She has no history of DVT or pulmonary embolism.  No recent surgery travel or immobilization.  No hemoptysis.  She does have epigastric discomfort.   The pain is not positional.   Past Medical History:  Diagnosis Date  . Allergic rhinitis    severe ([place])  . Asthma    as a child  . History of chicken pox   . Shingles 1990s    Patient Active Problem List   Diagnosis Date Noted  . Abdominal discomfort, epigastric 02/12/2018  . Coccydynia 01/15/2018  . Perennial allergic rhinitis with seasonal variation 12/11/2017  . Hyperhidrosis 04/19/2017  . Family history of thyroid disease 04/05/2017  . Essential hypertension 11/01/2016  . Acute nonintractable headache 11/01/2016  . GERD (gastroesophageal reflux disease) 09/21/2015  . Allergic rhinitis 10/28/2014  . Mild intermittent asthma 10/28/2014  . Multiple food allergies 10/28/2014  . Pollen-food allergy 10/28/2014  . Fatigue 12/08/2012  . Health care maintenance 04/11/2011    Past Surgical History:  Procedure Laterality Date  . NO PAST SURGERIES      Prior to Admission medications   Medication Sig Start Date End Date Taking?  Authorizing Provider  albuterol (PROAIR HFA) 108 (90 Base) MCG/ACT inhaler Inhale 2 puffs into the lungs every 4 (four) hours as needed. 12/11/17   Garnet Sierras, DO  amLODipine (NORVASC) 5 MG tablet Take 1 tablet (5 mg total) by mouth daily. 08/08/17   Ria Bush, MD  cyclobenzaprine (FLEXERIL) 5 MG tablet Take 1 tablet (5 mg total) by mouth 2 (two) times daily as needed (headache (sedation precautions)). 12/05/17   Ria Bush, MD  EPINEPHrine (AUVI-Q) 0.3 mg/0.3 mL IJ SOAJ injection Use as directed for severe allergic reaction 12/11/17   Garnet Sierras, DO  famotidine (PEPCID) 20 MG tablet Take 1 tablet (20 mg total) by mouth 2 (two) times daily. 02/14/18 02/14/19  Darel Hong, MD  mometasone (NASONEX) 50 MCG/ACT nasal spray Place 2 sprays into the nose daily. 12/11/17   Garnet Sierras, DO  montelukast (SINGULAIR) 10 MG tablet Take 1 tablet (10 mg total) by mouth at bedtime. 12/11/17   Garnet Sierras, DO  omeprazole (PRILOSEC) 40 MG capsule Take 1 capsule (40 mg total) by mouth daily as needed (heartburn). 04/05/17   Ria Bush, MD    Allergies Other and Sulfa drugs cross reactors  Family History  Problem Relation Age of Onset  . Diabetes Maternal Aunt   . Hypertension Maternal Aunt   . Diabetes Maternal Uncle   . Hypertension Maternal Uncle   . Stroke Maternal Uncle   . Heart disease Maternal Grandmother   . Diabetes Maternal Grandfather   . Cancer Maternal Grandfather  prostate  . Hypertension Mother   . Hypertension Brother   . Asthma Daughter   . Eczema Daughter   . Allergic rhinitis Neg Hx   . Urticaria Neg Hx     Social History Social History   Tobacco Use  . Smoking status: Former Smoker    Last attempt to quit: 09/27/2010    Years since quitting: 7.3  . Smokeless tobacco: Never Used  Substance Use Topics  . Alcohol use: Yes    Comment: Occasional  . Drug use: No    Review of Systems Constitutional: No fever/chills Eyes: No visual  changes. ENT: No sore throat. Cardiovascular: Positive for chest pain. Respiratory: Denies shortness of breath. Gastrointestinal: Positive for abdominal pain.  No nausea, no vomiting.  No diarrhea.  No constipation. Genitourinary: Negative for dysuria. Musculoskeletal: Negative for back pain. Skin: Negative for rash. Neurological: Negative for headaches, focal weakness or numbness.   ____________________________________________   PHYSICAL EXAM:  VITAL SIGNS: ED Triage Vitals  Enc Vitals Group     BP 02/13/18 1955 132/85     Pulse Rate 02/13/18 1955 64     Resp 02/13/18 1955 14     Temp 02/13/18 1955 97.7 F (36.5 C)     Temp Source 02/13/18 1955 Oral     SpO2 02/13/18 1955 100 %     Weight 02/13/18 1951 185 lb (83.9 kg)     Height 02/13/18 1951 5\' 2"  (1.575 m)     Head Circumference --      Peak Flow --      Pain Score 02/13/18 1951 5     Pain Loc --      Pain Edu? --      Excl. in [place]? --     Constitutional: Alert and oriented x4 nontoxic no diaphoresis speaks in full clear sentences Eyes: PERRL EOMI. Head: Atraumatic. Nose: No congestion/rhinnorhea. Mouth/Throat: No trismus Neck: No stridor.  Able to lie completely flat with no JVD Cardiovascular: Normal rate, regular rhythm. Grossly normal heart sounds.  Good peripheral circulation. Respiratory: Normal respiratory effort.  No retractions. Lungs CTAB and moving good air Gastrointestinal: Soft nondistended mild epigastric tenderness with no rebound or guarding no peritonitis.  Negative Murphy's Musculoskeletal: No lower extremity edema   Neurologic:  Normal speech and language. No gross focal neurologic deficits are appreciated. Skin:  Skin is warm, dry and intact. No rash noted. Psychiatric: Mood and affect are normal. Speech and behavior are normal.    ____________________________________________   DIFFERENTIAL includes but not limited to  Acute coronary syndrome, pulmonary embolism, aortic dissection,  pancreatitis, biliary disease ____________________________________________   LABS (all labs ordered are listed, but only abnormal results are displayed)  Labs Reviewed  BASIC METABOLIC PANEL - Abnormal; Notable for the following components:      Result Value   Potassium 3.4 (*)    Glucose, Bld 109 (*)    All other components within normal limits  CBC - Abnormal; Notable for the following components:   WBC 10.9 (*)    All other components within normal limits  TROPONIN I  HEPATIC FUNCTION PANEL  LIPASE, BLOOD  POCT PREGNANCY, URINE    Lab work reviewed by me with no signs of acute ischemia and no evidence of pancreatitis. __________________________________________  EKG  ED ECG REPORT I, Darel Hong, the attending physician, personally viewed and interpreted this ECG.  Date: 02/14/2018 EKG Time:  Rate: 69 Rhythm: normal sinus rhythm QRS Axis: normal Intervals: normal ST/T Wave abnormalities: normal Narrative  Interpretation: no evidence of acute ischemia  ____________________________________________  RADIOLOGY  Chest x-ray reviewed by me concerning for possible bronchitis Right upper quadrant ultrasound reviewed by me with no acute disease noted ____________________________________________   PROCEDURES  Procedure(s) performed: no  Procedures  Critical Care performed: no  ____________________________________________   INITIAL IMPRESSION / ASSESSMENT AND PLAN / ED COURSE  Pertinent labs & imaging results that were available during my care of the patient were reviewed by me and considered in my medical decision making (see chart for details).   As part of my medical decision making, I reviewed the following data within the [facility] History obtained from family if available, nursing notes, old chart and ekg, as well as notes from prior ED visits.  Patient comes to the emergency department with constant epigastric and lower chest  discomfort.  EKG is reassuring and her history is not consistent with acute coronary syndrome.  Given the epigastric nature and that the patient is overweight I have treated her symptomatically with Maalox Cytotec and Carafate and also get a right upper quadrant ultrasound to evaluate for biliary colic.  Following treatment the patient's symptoms are nearly completely resolved.  Her ultrasound is reassuring.  I will begin her on Pepcid twice a day for the next month and refer her back to primary care.  Strict return precautions have been given.      ____________________________________________   FINAL CLINICAL IMPRESSION(S) / ED DIAGNOSES  Final diagnoses:  Epigastric pain  Atypical chest pain      NEW MEDICATIONS STARTED DURING THIS VISIT:  Discharge Medication List as of 02/14/2018  1:21 AM    START taking these medications   Details  famotidine (PEPCID) 20 MG tablet Take 1 tablet (20 mg total) by mouth 2 (two) times daily., Starting Fri 02/14/2018, Until Sat 02/14/2019, Print         Note:  This document was prepared using Dragon voice recognition software and may include unintentional dictation errors.    Darel Hong, MD 02/17/18 [phone]

## 2018-02-13 NOTE — ED Notes (Signed)
Pt to the er for chest pain that started this morning while standing at work. 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 Pt reports sudden onset chest pain in the center of chest. Pain is constant. No change in pain and pt describes it as a knot. Pt denies change in pain with eating, nonsmoker, nondrinker.

## 2018-02-13 NOTE — ED Triage Notes (Signed)
Pt to ED after being seen at urgent care c/o central chest pain that is sharp and radiating to back, without n/v/d, denies cough or SOB, denies injury, was at work when started this morning.  Pt ambulatory to triage, chest rise even and unlabored, skin warm and dry and in NAD at this time.

## 2018-02-14 ENCOUNTER — Emergency Department: Payer: Federal, State, Local not specified - PPO

## 2018-02-14 DIAGNOSIS — R1013 Epigastric pain: Secondary | ICD-10-CM | POA: Diagnosis not present

## 2018-02-14 LAB — HEPATIC FUNCTION PANEL
ALT: 18 U/L (ref 0–44)
AST: 20 U/L (ref 15–41)
Albumin: 4 g/dL (ref 3.5–5.0)
Alkaline Phosphatase: 52 U/L (ref 38–126)
Bilirubin, Direct: 0.1 mg/dL (ref 0.0–0.2)
Indirect Bilirubin: 0.5 mg/dL (ref 0.3–0.9)
Total Bilirubin: 0.6 mg/dL (ref 0.3–1.2)
Total Protein: 6.8 g/dL (ref 6.5–8.1)

## 2018-02-14 LAB — LIPASE, BLOOD: Lipase: 27 U/L (ref 11–51)

## 2018-02-14 MED ORDER — FAMOTIDINE 20 MG PO TABS: 20.0000 mg | ORAL_TABLET | Freq: Two times a day (BID) | ORAL | 0 refills | Status: DC

## 2018-02-14 NOTE — ED Notes (Signed)
Patient alert and oriented x4. Discharge paperwork reviewed with patient. Patient voiced understanding and had no questions. Patient states pain at this time is 2/10. PATIENT signed discharge.

## 2018-02-14 NOTE — Discharge Instructions (Signed)
Fortunately today your lab work, your EKG, and your chest x-ray were reassuring.  Please begin taking your antacid twice a day as prescribed and follow-up with your primary care physician for reevaluation.  Return to the emergency department sooner for any concerns whatsoever.  It was a pleasure to take care of you today, and thank you for coming to our emergency department.  If you have any questions or concerns before leaving please ask the nurse to grab me and I'm more than happy to go through your aftercare instructions again.  If you were prescribed any opioid pain medication today such as Norco, Vicodin, Percocet, morphine, hydrocodone, or oxycodone please make sure you do not drive when you are taking this medication as it can alter your ability to drive safely.  If you have any concerns once you are home that you are not improving or are in fact getting worse before you can make it to your follow-up appointment, please do not hesitate to call 911 and come back for further evaluation.  Darel Hong, MD  Results for orders placed or performed during the hospital encounter of 52/84/13  Basic metabolic panel  Result Value Ref Range   Sodium 139 135 - 145 mmol/L   Potassium 3.4 (L) 3.5 - 5.1 mmol/L   Chloride 105 98 - 111 mmol/L   CO2 26 22 - 32 mmol/L   Glucose, Bld 109 (H) 70 - 99 mg/dL   BUN 10 6 - 20 mg/dL   Creatinine, Ser 0.72 0.44 - 1.00 mg/dL   Calcium 8.9 8.9 - 10.3 mg/dL   GFR calc non Af Amer >60 >60 mL/min   GFR calc Af Amer >60 >60 mL/min   Anion gap 8 5 - 15  CBC  Result Value Ref Range   WBC 10.9 (H) 4.0 - 10.5 K/uL   RBC 4.93 3.87 - 5.11 MIL/uL   Hemoglobin 13.9 12.0 - 15.0 g/dL   HCT 43.2 36.0 - 46.0 %   MCV 87.6 80.0 - 100.0 fL   MCH 28.2 26.0 - 34.0 pg   MCHC 32.2 30.0 - 36.0 g/dL   RDW 14.7 11.5 - 15.5 %   Platelets 266 150 - 400 K/uL   nRBC 0.0 0.0 - 0.2 %  Troponin I - ONCE - STAT  Result Value Ref Range   Troponin I <0.03 <0.03 ng/mL  Hepatic  function panel  Result Value Ref Range   Total Protein 6.8 6.5 - 8.1 g/dL   Albumin 4.0 3.5 - 5.0 g/dL   AST 20 15 - 41 U/L   ALT 18 0 - 44 U/L   Alkaline Phosphatase 52 38 - 126 U/L   Total Bilirubin 0.6 0.3 - 1.2 mg/dL   Bilirubin, Direct 0.1 0.0 - 0.2 mg/dL   Indirect Bilirubin 0.5 0.3 - 0.9 mg/dL  Lipase, blood  Result Value Ref Range   Lipase 27 11 - 51 U/L  Pregnancy, urine POC  Result Value Ref Range   Preg Test, Ur NEGATIVE NEGATIVE   Dg Chest 2 View  Result Date: 02/13/2018 CLINICAL DATA:  Chest pain. EXAM: CHEST - 2 VIEW COMPARISON:  None. FINDINGS: Mildly coarsened lung markings. The heart, hila, mediastinum, lungs, and pleura are otherwise unremarkable. No focal infiltrate. IMPRESSION: Mildly coarsened lung markings suggest bronchitic change. No focal infiltrate or other acute abnormalities. Electronically Signed   By: Dorise Bullion III M.D   On: 02/13/2018 20:33   Dg Sacrum/coccyx  Result Date: 01/29/2018 CLINICAL DATA:  One month  history of sacrococcygeal pain. No known injury. EXAM: SACRUM AND COCCYX - 2+ VIEW COMPARISON:  None. FINDINGS: No evidence of acute, subacute or healed fractures. Sacroiliac joints intact without significant degenerative change. Visualized LOWER lumbar spine unremarkable. No focal osseous abnormality involving the bony pelvis. IMPRESSION: Normal examination. Electronically Signed   By: Evangeline Dakin M.D.   On: 01/29/2018 09:37   US Abdomen Limited Ruq  Result Date: 02/14/2018 CLINICAL DATA:  Epigastric pain since this morning. EXAM: ULTRASOUND ABDOMEN LIMITED RIGHT UPPER QUADRANT COMPARISON:  None. FINDINGS: Gallbladder: No gallstones or wall thickening visualized. No sonographic Murphy sign noted by sonographer. Common bile duct: Diameter: 3.2 mm, normal Liver: No focal lesion identified. Within normal limits in parenchymal echogenicity. Portal vein is patent on color Doppler imaging with normal direction of blood flow towards the liver.  IMPRESSION: Normal examination. Electronically Signed   By: Lucienne Capers M.D.   On: 02/14/2018 01:13

## 2018-02-18 ENCOUNTER — Ambulatory Visit (INDEPENDENT_AMBULATORY_CARE_PROVIDER_SITE_OTHER): Payer: Federal, State, Local not specified - PPO | Admitting: *Deleted

## 2018-02-18 DIAGNOSIS — J309 Allergic rhinitis, unspecified: Secondary | ICD-10-CM | POA: Diagnosis not present

## 2018-02-26 HISTORY — PX: COLONOSCOPY: SHX174

## 2018-03-04 ENCOUNTER — Ambulatory Visit (INDEPENDENT_AMBULATORY_CARE_PROVIDER_SITE_OTHER): Payer: Federal, State, Local not specified - PPO | Admitting: *Deleted

## 2018-03-04 DIAGNOSIS — J309 Allergic rhinitis, unspecified: Secondary | ICD-10-CM | POA: Diagnosis not present

## 2018-03-10 ENCOUNTER — Ambulatory Visit (INDEPENDENT_AMBULATORY_CARE_PROVIDER_SITE_OTHER): Payer: Federal, State, Local not specified - PPO

## 2018-03-10 DIAGNOSIS — J309 Allergic rhinitis, unspecified: Secondary | ICD-10-CM

## 2018-03-13 ENCOUNTER — Ambulatory Visit (INDEPENDENT_AMBULATORY_CARE_PROVIDER_SITE_OTHER): Payer: Federal, State, Local not specified - PPO | Admitting: *Deleted

## 2018-03-13 DIAGNOSIS — J309 Allergic rhinitis, unspecified: Secondary | ICD-10-CM | POA: Diagnosis not present

## 2018-03-14 ENCOUNTER — Telehealth: Payer: Self-pay

## 2018-03-14 ENCOUNTER — Encounter: Payer: Self-pay | Admitting: Obstetrics & Gynecology

## 2018-03-14 ENCOUNTER — Other Ambulatory Visit: Payer: Self-pay | Admitting: Obstetrics & Gynecology

## 2018-03-14 ENCOUNTER — Ambulatory Visit (INDEPENDENT_AMBULATORY_CARE_PROVIDER_SITE_OTHER): Payer: Federal, State, Local not specified - PPO | Admitting: Obstetrics & Gynecology

## 2018-03-14 VITALS — BP 100/70 | Ht 62.0 in | Wt 189.0 lb

## 2018-03-14 DIAGNOSIS — Z01419 Encounter for gynecological examination (general) (routine) without abnormal findings: Secondary | ICD-10-CM | POA: Diagnosis not present

## 2018-03-14 DIAGNOSIS — N760 Acute vaginitis: Secondary | ICD-10-CM

## 2018-03-14 DIAGNOSIS — B9689 Other specified bacterial agents as the cause of diseases classified elsewhere: Secondary | ICD-10-CM | POA: Diagnosis not present

## 2018-03-14 DIAGNOSIS — Z3049 Encounter for surveillance of other contraceptives: Secondary | ICD-10-CM | POA: Diagnosis not present

## 2018-03-14 MED ORDER — BORIC ACID CRYS: 600.0000 mg | CRYSTALS | 5 refills | Status: DC

## 2018-03-14 NOTE — Patient Instructions (Signed)
Boric Acid vaginal suppository  What is this medicine?  BORIC ACID (BOHR ik AS id) helps to promote the proper acid balance in the vagina. It is used to help treat yeast infections of the vagina and relieve symptoms such as itching and burning.  This medicine may be used for other purposes; ask your health care provider or pharmacist if you have questions.  COMMON BRAND NAME(S): Hylafem  What should I tell my health care provider before I take this medicine?  They need to know if you have any of these conditions:  -diabetes  -frequent infections  -HIV or AIDS  -immune system problems  -an unusual or allergic reaction to boric acid, other medicines, foods, dyes, or preservatives  -pregnant or trying to get pregnant  -breast-feeding  How should I use this medicine?  This medicine is for use in the vagina. Do not take by mouth. Follow the directions on the prescription label. Read package directions carefully before using. Wash hands before and after use. Use this medicine at bedtime, unless otherwise directed by your doctor. Do not use your medicine more often than directed. Do not stop using this medicine except on your doctor's advice.  Talk to your pediatrician regarding the use of this medicine in children. This medicine is not approved for use in children.  Overdosage: If you think you have taken too much of this medicine contact a poison control center or emergency room at once.  NOTE: This medicine is only for you. Do not share this medicine with others.  What if I miss a dose?  If you miss a dose, use it as soon as you can. If it is almost time for your next dose, use only that dose. Do not use double or extra doses.  What may interact with this medicine?  Interactions are not expected. Do not use any other vaginal products without telling your doctor or health care professional.  This list may not describe all possible interactions. Give your health care provider a list of all the medicines, herbs,  non-prescription drugs, or dietary supplements you use. Also tell them if you smoke, drink alcohol, or use illegal drugs. Some items may interact with your medicine.  What should I watch for while using this medicine?  Tell your doctor or health care professional if your symptoms do not start to get better within a few days.  It is better not to have sex until you have finished your treatment. This medicine may damage condoms or diaphragms and cause them not to work properly. It may also decrease the effect of vaginal spermicides. Do not rely on any of these methods to prevent sexually transmitted diseases or pregnancy while you are using this medicine.  Vaginal medicines usually will come out of the vagina during treatment. To keep the medicine from getting on your clothing, wear a panty liner. The use of tampons is not recommended. To help clear up the infection, wear freshly washed cotton, not synthetic, underwear.  What side effects may I notice from receiving this medicine?  Side effects that you should report to your doctor or health care professional as soon as possible:  -allergic reactions like skin rash, itching or hives  -vaginal irritation, redness, or burning  Side effects that usually do not require medical attention (report to your doctor or health care professional if they continue or are bothersome):  -vaginal discharge  This list may not describe all possible side effects. Call your doctor for medical advice   about side effects. You may report side effects to FDA at 1-800-FDA-1088.  Where should I keep my medicine?  Keep out of the reach of children.  Store in a cool, dry place between 15 and 30 degrees C (59 and 86 degrees F). Keep away from sunlight. Throw away any unused medicine after the expiration date.  NOTE: This sheet is a summary. It may not cover all possible information. If you have questions about this medicine, talk to your doctor, pharmacist, or health care provider.  © 2019  Elsevier/Gold Standard (2015-03-17 07:29:58)

## 2018-03-14 NOTE — Telephone Encounter (Signed)
Let her know Rx sent to [facility] in [place]

## 2018-03-14 NOTE — Progress Notes (Signed)
HPI:      Sabrina Smith is a 38 y.o. G2P1011 who LMP was Patient's last menstrual period was 03/01/2018., she presents today for her annual examination. The patient has no complaints today. The patient is sexually active. Her last pap: approximate date 2018 and was normal and last mammogram: patient has never had a mammogram. The patient does perform self breast exams.  There is no notable family history of breast or ovarian cancer in her family.  The patient has regular exercise: yes.  The patient denies current symptoms of depression.  Periods reg. 7 d, crampy.  Occas sweats and then has intermittent vag d/c w odor.  GYN History: Contraception: none.  Desires. Prior IUD, has been off of [facility] for 18 mos.  Condoms. Does not desire pregnancy.  PMHx: Past Medical History:  Diagnosis Date  . Allergic rhinitis    severe ([place])  . Asthma    as a child  . History of chicken pox   . Shingles 1990s   Past Surgical History:  Procedure Laterality Date  . NO PAST SURGERIES     Family History  Problem Relation Age of Onset  . Diabetes Maternal Aunt   . Hypertension Maternal Aunt   . Diabetes Maternal Uncle   . Hypertension Maternal Uncle   . Stroke Maternal Uncle   . Heart disease Maternal Grandmother   . Diabetes Maternal Grandfather   . Cancer Maternal Grandfather        prostate  . Hypertension Mother   . Hypertension Brother   . Asthma Daughter   . Eczema Daughter   . Allergic rhinitis Neg Hx   . Urticaria Neg Hx    Social History   Tobacco Use  . Smoking status: Former Smoker    Last attempt to quit: 09/27/2010    Years since quitting: 7.4  . Smokeless tobacco: Never Used  Substance Use Topics  . Alcohol use: Yes    Comment: Occasional  . Drug use: No    Current Outpatient Medications:  .  albuterol (PROAIR HFA) 108 (90 Base) MCG/ACT inhaler, Inhale 2 puffs into the lungs every 4 (four) hours as needed., Disp: 1 Inhaler, Rfl: 1 .  amLODipine (NORVASC) 5 MG  tablet, Take 1 tablet (5 mg total) by mouth daily., Disp: 90 tablet, Rfl: 3 .  EPINEPHrine (AUVI-Q) 0.3 mg/0.3 mL IJ SOAJ injection, Use as directed for severe allergic reaction, Disp: 2 Device, Rfl: 1 .  famotidine (PEPCID) 20 MG tablet, Take 1 tablet (20 mg total) by mouth 2 (two) times daily., Disp: 60 tablet, Rfl: 0 .  mometasone (NASONEX) 50 MCG/ACT nasal spray, Place 2 sprays into the nose daily., Disp: 17 g, Rfl: 5 .  montelukast (SINGULAIR) 10 MG tablet, Take 1 tablet (10 mg total) by mouth at bedtime., Disp: 30 tablet, Rfl: 5 .  omeprazole (PRILOSEC) 40 MG capsule, Take 1 capsule (40 mg total) by mouth daily as needed (heartburn)., Disp: 30 capsule, Rfl: 3 .  [START ON 03/17/2018] Boric Acid CRYS, Place 600 mg vaginally 2 (two) times a week., Disp: 500 g, Rfl: 5 .  cyclobenzaprine (FLEXERIL) 5 MG tablet, Take 1 tablet (5 mg total) by mouth 2 (two) times daily as needed (headache (sedation precautions)). (Patient not taking: Reported on 03/14/2018), Disp: 30 tablet, Rfl: 1 Allergies: Other and Sulfa drugs cross reactors  Review of Systems  Constitutional: Negative for chills, fever and malaise/fatigue.  HENT: Negative for congestion, sinus pain and sore throat.  Eyes: Negative for blurred vision and pain.  Respiratory: Negative for cough and wheezing.   Cardiovascular: Negative for chest pain and leg swelling.  Gastrointestinal: Negative for abdominal pain, constipation, diarrhea, heartburn, nausea and vomiting.  Genitourinary: Negative for dysuria, frequency, hematuria and urgency.  Musculoskeletal: Negative for back pain, joint pain, myalgias and neck pain.  Skin: Negative for itching and rash.  Neurological: Negative for dizziness, tremors and weakness.  Endo/Heme/Allergies: Does not bruise/bleed easily.  Psychiatric/Behavioral: Negative for depression. The patient is not nervous/anxious and does not have insomnia.    Objective: BP 100/70   Ht 5\' 2"  (1.575 m)   Wt 189 lb (85.7  kg)   LMP 03/01/2018   BMI 34.57 kg/m   Filed Weights   03/14/18 1426  Weight: 189 lb (85.7 kg)   Body mass index is 34.57 kg/m. Physical Exam Constitutional:      General: She is not in acute distress.    Appearance: She is well-developed.  Genitourinary:     Pelvic exam was performed with patient supine.     Vagina, uterus and rectum normal.     No lesions in the vagina.     No vaginal bleeding.     No cervical motion tenderness, friability, lesion or polyp.     Uterus is mobile.     Uterus is not enlarged.     No uterine mass detected.    Uterus is midaxial.     No right or left adnexal mass present.     Right adnexa not tender.     Left adnexa not tender.  HENT:     Head: Normocephalic and atraumatic. No laceration.     Right Ear: Hearing normal.     Left Ear: Hearing normal.     Mouth/Throat:     Pharynx: Uvula midline.  Eyes:     Pupils: Pupils are equal, round, and reactive to light.  Neck:     Musculoskeletal: Normal range of motion and neck supple.     Thyroid: No thyromegaly.  Cardiovascular:     Rate and Rhythm: Normal rate and regular rhythm.     Heart sounds: No murmur. No friction rub. No gallop.   Pulmonary:     Effort: Pulmonary effort is normal. No respiratory distress.     Breath sounds: Normal breath sounds. No wheezing.  Chest:     Breasts:        Right: No mass, skin change or tenderness.        Left: No mass, skin change or tenderness.  Abdominal:     General: Bowel sounds are normal. There is no distension.     Palpations: Abdomen is soft.     Tenderness: There is no abdominal tenderness. There is no rebound.  Musculoskeletal: Normal range of motion.  Neurological:     Mental Status: She is alert and oriented to person, place, and time.     Cranial Nerves: No cranial nerve deficit.  Skin:    General: Skin is warm and dry.  Psychiatric:        Judgment: Judgment normal.  Vitals signs reviewed.   Microscopic wet-mount exam shows clue  cells.  Assessment: 1. Women's annual routine gynecological examination   2. Encounter for surveillance of other contraceptive   3. BV (bacterial vaginosis)    Screening Plan:            1.  Cervical Screening-  Pap smear schedule reviewed with patient  2. Breast screening- Exam annually  and mammogram>40 planned   3.  Labs managed by PCP   4. Counseling for contraception: All options counseled, desires [place], place after next period   5. BV- Boric acid prevention twice weekly, as not overly symptomatic today and in need of preventative management     F/U  Return in about 3 weeks (around 04/04/2018) for Nexplanon procedure.  Barnett Applebaum, MD, [facility], [facility] 03/14/2018  3:06 PM

## 2018-03-14 NOTE — Telephone Encounter (Signed)
Sabrina Smith from [facility] called stating they do not compound Boric Acid.  Rx needs to go to a compounding pharmacy.  [phone]

## 2018-03-17 ENCOUNTER — Ambulatory Visit (INDEPENDENT_AMBULATORY_CARE_PROVIDER_SITE_OTHER): Payer: Federal, State, Local not specified - PPO | Admitting: *Deleted

## 2018-03-17 DIAGNOSIS — J309 Allergic rhinitis, unspecified: Secondary | ICD-10-CM

## 2018-03-17 NOTE — Telephone Encounter (Signed)
Left detailed msg.

## 2018-03-19 ENCOUNTER — Encounter: Payer: Self-pay | Admitting: Family Medicine

## 2018-03-19 DIAGNOSIS — K921 Melena: Secondary | ICD-10-CM | POA: Diagnosis not present

## 2018-03-19 DIAGNOSIS — K5900 Constipation, unspecified: Secondary | ICD-10-CM | POA: Diagnosis not present

## 2018-03-19 DIAGNOSIS — R1084 Generalized abdominal pain: Secondary | ICD-10-CM | POA: Diagnosis not present

## 2018-03-19 DIAGNOSIS — K649 Unspecified hemorrhoids: Secondary | ICD-10-CM | POA: Diagnosis not present

## 2018-03-21 ENCOUNTER — Encounter: Payer: Self-pay | Admitting: Family Medicine

## 2018-03-25 ENCOUNTER — Ambulatory Visit (INDEPENDENT_AMBULATORY_CARE_PROVIDER_SITE_OTHER): Payer: Federal, State, Local not specified - PPO | Admitting: *Deleted

## 2018-03-25 DIAGNOSIS — J309 Allergic rhinitis, unspecified: Secondary | ICD-10-CM | POA: Diagnosis not present

## 2018-04-01 ENCOUNTER — Ambulatory Visit (INDEPENDENT_AMBULATORY_CARE_PROVIDER_SITE_OTHER): Payer: Federal, State, Local not specified - PPO | Admitting: *Deleted

## 2018-04-01 DIAGNOSIS — J309 Allergic rhinitis, unspecified: Secondary | ICD-10-CM | POA: Diagnosis not present

## 2018-04-09 ENCOUNTER — Ambulatory Visit (INDEPENDENT_AMBULATORY_CARE_PROVIDER_SITE_OTHER): Payer: Federal, State, Local not specified - PPO | Admitting: *Deleted

## 2018-04-09 DIAGNOSIS — J309 Allergic rhinitis, unspecified: Secondary | ICD-10-CM | POA: Diagnosis not present

## 2018-04-22 ENCOUNTER — Ambulatory Visit (INDEPENDENT_AMBULATORY_CARE_PROVIDER_SITE_OTHER): Payer: Federal, State, Local not specified - PPO | Admitting: *Deleted

## 2018-04-22 DIAGNOSIS — J309 Allergic rhinitis, unspecified: Secondary | ICD-10-CM | POA: Diagnosis not present

## 2018-04-29 DIAGNOSIS — M533 Sacrococcygeal disorders, not elsewhere classified: Secondary | ICD-10-CM | POA: Diagnosis not present

## 2018-04-30 ENCOUNTER — Ambulatory Visit: Payer: Federal, State, Local not specified - PPO | Admitting: Allergy

## 2018-05-05 ENCOUNTER — Ambulatory Visit (INDEPENDENT_AMBULATORY_CARE_PROVIDER_SITE_OTHER): Payer: Federal, State, Local not specified - PPO

## 2018-05-05 DIAGNOSIS — J309 Allergic rhinitis, unspecified: Secondary | ICD-10-CM

## 2018-05-07 ENCOUNTER — Other Ambulatory Visit: Payer: Self-pay

## 2018-05-07 ENCOUNTER — Encounter: Payer: Self-pay | Admitting: Allergy

## 2018-05-07 ENCOUNTER — Ambulatory Visit: Payer: Federal, State, Local not specified - PPO | Admitting: Allergy

## 2018-05-07 VITALS — BP 98/58 | HR 68 | Temp 98.4°F | Resp 16 | Ht 62.0 in | Wt 191.8 lb

## 2018-05-07 DIAGNOSIS — J3089 Other allergic rhinitis: Secondary | ICD-10-CM | POA: Diagnosis not present

## 2018-05-07 DIAGNOSIS — J4520 Mild intermittent asthma, uncomplicated: Secondary | ICD-10-CM | POA: Diagnosis not present

## 2018-05-07 DIAGNOSIS — T781XXD Other adverse food reactions, not elsewhere classified, subsequent encounter: Secondary | ICD-10-CM

## 2018-05-07 DIAGNOSIS — J302 Other seasonal allergic rhinitis: Secondary | ICD-10-CM

## 2018-05-07 DIAGNOSIS — M533 Sacrococcygeal disorders, not elsewhere classified: Secondary | ICD-10-CM | POA: Diagnosis not present

## 2018-05-07 MED ORDER — EPINEPHRINE 0.3 MG/0.3ML IJ SOAJ: INTRAMUSCULAR | 1 refills | Status: DC

## 2018-05-07 NOTE — Patient Instructions (Addendum)
Reaction?  Continue to avoid soy, sesame and pistachio.  Get bloodwork in 2-4 weeks.   Look at ingredient list for the sesame chicken and let me know if we need to test for anything else.    Perennial allergic rhinitis with seasonal variation  Past skin testing showed: positive grass, ragweed, weed, trees, mold, dust mites, cockroaches.   Continue environmental control measures.   Continue allergy injections.   May use over the counter antihistamines such as Zyrtec (cetirizine), Claritin (loratadine), Allegra (fexofenadine), or Xyzal (levocetirizine) daily as needed.  Continue Singulair 10mg  daily.  Continue Nasonex 2 sprays daily.   Mild intermittent asthma  May use albuterol rescue inhaler 2 puffs or nebulizer every 4 to 6 hours as needed for shortness of breath, chest tightness, coughing, and wheezing. May use albuterol rescue inhaler 2 puffs 5 to 15 minutes prior to strenuous physical activities.  Monitor symptoms.  Pollen-food allergy  Continue to avoid tree nuts, soy and apples.  I have prescribed epinephrine injectable and demonstrated proper use. For mild symptoms you can take over the counter antihistamines such as Benadryl and monitor symptoms closely. If symptoms worsen or if you have severe symptoms including breathing issues, throat closure, significant swelling, whole body hives, severe diarrhea and vomiting, lightheadedness then inject epinephrine and seek immediate medical care afterwards.  Follow up in 6 months

## 2018-05-07 NOTE — Progress Notes (Signed)
Follow Up Note  RE: Sabrina Smith MRN: [id number] DOB: 1980-09-15 Date of Office Visit: 05/07/2018  Referring provider: Ria Bush, MD Primary care provider: Ria Bush, MD  Chief Complaint: Allergic Rhinitis  and Allergic Reaction (ate sesame chicken, woke up with post nasal drip)  History of Present Illness: I had the pleasure of seeing Sabrina Smith for a follow up visit at the [facility] on 05/08/2018. She is a 38 y.o. female, who is being followed for allergic rhinitis, asthma and oral allergy syndrome. Today she is here for regular follow up visit new complaint of allergic reaction. Her previous allergy office visit was on 12/11/2017 with Dr. Maudie Mercury.   Patient had sesame chicken (it was a frozen dinner) last night with pistachios around 1PM and no symptoms until the next day. She woke up this morning with post nasal drip. Denies any other symptoms.   She took some allergy medications which helped. Eats chicken with no problems. She had sesame and pistachios before with no issues.  Perennial allergic rhinitis with seasonal variation No issus with allergy injections and had injection on Monday. Currently on Nasonex, Singulair and OTC antihistamines with good benefit.   Mild intermittent asthma Denies any SOB, coughing, wheezing, chest tightness, nocturnal awakenings, ER/urgent care visits or prednisone use since the last visit. Used albuterol about 1 month ago for wheezing with good benefit.   Pollen-food allergy Currently avoiding tree nuts, soy and apples.  Assessment and Plan: Jenan is a 38 y.o. female with: Adverse food reaction Patient concerned about possibly having a reaction to sesame chicken with pistachios. Given timeline discussed that is unlikely as it was over 12 hours from ingestion to symptom onset of only PND. This wast most likely due to her environmental allergies. Nevertheless, gave labwork for soy, sesame and pistachio  IgE. Advised patient that yes pistachio falls under tree nuts which she is supposed to be avoiding. Can't skin test today as this just happened yesterday/this morning.  Continue to avoid soy, sesame and pistachio.  Get bloodwork in 2-4 weeks.   Look at ingredient list for the sesame chicken and let me know if we need to test for anything else.   Renewed epinephrine for her.   Pollen-food allergy Past history - Oral pruritus with soy, fresh apples and certain tree nuts. 2014 skin testing was mildly positive to soy, almond and hazelnuts.  Continue to avoid tree nuts, soy and apples.  Mild intermittent asthma without complication Well-controlled.  May use albuterol rescue inhaler 2 puffs or nebulizer every 4 to 6 hours as needed for shortness of breath, chest tightness, coughing, and wheezing. May use albuterol rescue inhaler 2 puffs 5 to 15 minutes prior to strenuous physical activities. Monitor frequency of use.   Get spirometry at next visit.  Perennial allergic rhinitis with seasonal variation Past history - Perennial rhinoconjunctivitis symptoms for the past many years with worsening in the spring.  Patient used over-the-counter antihistamines, Singulair and Flonase as needed with some benefit.  2014 skin testing was positive to grass, ragweed, weeds, trees, mold, dust mites, horse and cockroach.  She was on allergy immunotherapy from 2016-2017 (GT + WDM) but stopped due to scheduling issues. Patient follows with ENT. 2019  skin testing showed: positive grass, ragweed, weed, trees, mold, dust mites, cockroaches.  Interim history - Doing well on AIT.  Continue environmental control measures.   Continue allergy injections.   May use over the counter antihistamines such as Zyrtec (cetirizine),  Claritin (loratadine), Allegra (fexofenadine), or Xyzal (levocetirizine) daily as needed.  Continue Singulair 10mg  daily.  Continue Nasonex 2 sprays daily.   Return in about 6 months (around  11/07/2018).  Meds ordered this encounter  Medications   EPINEPHrine (AUVI-Q) 0.3 mg/0.3 mL IJ SOAJ injection    Sig: Use as directed for severe allergic reaction    Dispense:  2 Device    Refill:  1    Lab Orders     Soybean IgE     Allergen Sesame f10     Allergen Pistachio f203  Diagnostics: None.  Medication List:  Current Outpatient Medications  Medication Sig Dispense Refill   amLODipine (NORVASC) 5 MG tablet Take 1 tablet (5 mg total) by mouth daily. 90 tablet 3   Boric Acid CRYS Place 600 mg vaginally 2 (two) times a week. 500 g 5   EPINEPHrine (AUVI-Q) 0.3 mg/0.3 mL IJ SOAJ injection Use as directed for severe allergic reaction 2 Device 1   mometasone (NASONEX) 50 MCG/ACT nasal spray Place 2 sprays into the nose daily. 17 g 5   montelukast (SINGULAIR) 10 MG tablet Take 1 tablet (10 mg total) by mouth at bedtime. 30 tablet 5   omeprazole (PRILOSEC) 40 MG capsule Take 1 capsule (40 mg total) by mouth daily as needed (heartburn). 30 capsule 3   polyethylene glycol-electrolytes (NULYTELY/GOLYTELY) 420 g solution      albuterol (PROAIR HFA) 108 (90 Base) MCG/ACT inhaler Inhale 2 puffs into the lungs every 4 (four) hours as needed. (Patient not taking: Reported on 05/07/2018) 1 Inhaler 1   No current facility-administered medications for this visit.    Allergies: Allergies  Allergen Reactions   Other     Soybean, Almond, Hazelnut   Sulfa Drugs Cross Reactors Hives   I reviewed her past medical history, social history, family history, and environmental history and no significant changes have been reported from previous visit on 12/11/2017.  Review of Systems  Constitutional: Negative for appetite change, chills, fever and unexpected weight change.  HENT: Positive for congestion. Negative for rhinorrhea.   Eyes: Negative for itching.  Respiratory: Negative for cough, chest tightness, shortness of breath and wheezing.   Cardiovascular: Negative for chest pain.    Gastrointestinal: Negative for abdominal pain.  Genitourinary: Negative for difficulty urinating.  Skin: Negative for rash.  Allergic/Immunologic: Positive for environmental allergies and food allergies.  Neurological: Negative for headaches.   Objective: BP (!) 98/58 (BP Location: Left Arm, Patient Position: Sitting, Cuff Size: Large)    Pulse 68    Temp 98.4 F (36.9 C) (Oral)    Resp 16    Ht 5\' 2"  (1.575 m)    Wt 191 lb 12.8 oz (87 kg)    LMP 05/03/2018    SpO2 98%    BMI 35.08 kg/m  Body mass index is 35.08 kg/m. Physical Exam  Constitutional: She is oriented to person, place, and time. She appears well-developed and well-nourished.  HENT:  Head: Normocephalic and atraumatic.  Right Ear: External ear normal.  Left Ear: External ear normal.  Nose: Nose normal.  Mouth/Throat: Oropharynx is clear and moist.  Eyes: Conjunctivae and EOM are normal.  Neck: Neck supple.  Cardiovascular: Normal rate, regular rhythm and normal heart sounds. Exam reveals no gallop and no friction rub.  No murmur heard. Pulmonary/Chest: Effort normal and breath sounds normal. She has no wheezes. She has no rales.  Abdominal: Soft.  Lymphadenopathy:    She has no cervical adenopathy.  Neurological: She  is alert and oriented to person, place, and time.  Skin: Skin is warm. No rash noted.  Psychiatric: She has a normal mood and affect. Her behavior is normal.  Nursing note and vitals reviewed.  Previous notes and tests were reviewed. The plan was reviewed with the patient/family, and all questions/concerned were addressed.  It was my pleasure to see Sabrina Smith today and participate in her care. Please feel free to contact me with any questions or concerns.  Sincerely,  Rexene Alberts, DO Allergy & Immunology  [facility] office: [phone] [facility] office: [phone]

## 2018-05-08 ENCOUNTER — Encounter: Payer: Self-pay | Admitting: Allergy

## 2018-05-08 DIAGNOSIS — T781XXA Other adverse food reactions, not elsewhere classified, initial encounter: Secondary | ICD-10-CM | POA: Insufficient documentation

## 2018-05-08 NOTE — Assessment & Plan Note (Signed)
Past history - Perennial rhinoconjunctivitis symptoms for the past many years with worsening in the spring.  Patient used over-the-counter antihistamines, Singulair and Flonase as needed with some benefit.  2014 skin testing was positive to grass, ragweed, weeds, trees, mold, dust mites, horse and cockroach.  She was on allergy immunotherapy from 2016-2017 (GT + WDM) but stopped due to scheduling issues. Patient follows with ENT. 2019  skin testing showed: positive grass, ragweed, weed, trees, mold, dust mites, cockroaches.  Interim history - Doing well on AIT.  Continue environmental control measures.   Continue allergy injections.   May use over the counter antihistamines such as Zyrtec (cetirizine), Claritin (loratadine), Allegra (fexofenadine), or Xyzal (levocetirizine) daily as needed.  Continue Singulair 10mg  daily.  Continue Nasonex 2 sprays daily.

## 2018-05-08 NOTE — Assessment & Plan Note (Signed)
Well-controlled.  May use albuterol rescue inhaler 2 puffs or nebulizer every 4 to 6 hours as needed for shortness of breath, chest tightness, coughing, and wheezing. May use albuterol rescue inhaler 2 puffs 5 to 15 minutes prior to strenuous physical activities. Monitor frequency of use.   Get spirometry at next visit.

## 2018-05-08 NOTE — Assessment & Plan Note (Signed)
Past history - Oral pruritus with soy, fresh apples and certain tree nuts. 2014 skin testing was mildly positive to soy, almond and hazelnuts.  Continue to avoid tree nuts, soy and apples.

## 2018-05-08 NOTE — Assessment & Plan Note (Signed)
Patient concerned about possibly having a reaction to sesame chicken with pistachios. Given timeline discussed that is unlikely as it was over 12 hours from ingestion to symptom onset of only PND. This wast most likely due to her environmental allergies. Nevertheless, gave labwork for soy, sesame and pistachio IgE. Advised patient that yes pistachio falls under tree nuts which she is supposed to be avoiding. Can't skin test today as this just happened yesterday/this morning.  Continue to avoid soy, sesame and pistachio.  Get bloodwork in 2-4 weeks.   Look at ingredient list for the sesame chicken and let me know if we need to test for anything else.   Renewed epinephrine for her.

## 2018-05-13 ENCOUNTER — Telehealth: Payer: Self-pay

## 2018-05-13 DIAGNOSIS — M533 Sacrococcygeal disorders, not elsewhere classified: Secondary | ICD-10-CM | POA: Diagnosis not present

## 2018-05-13 NOTE — Telephone Encounter (Signed)
We received a fax from [facility] They have been trying to contact you about your Sabrina Smith Epipen They are trying to coordinate delivery with you and verify your address Please call them back [phone]

## 2018-05-14 ENCOUNTER — Other Ambulatory Visit: Payer: Self-pay

## 2018-05-14 ENCOUNTER — Ambulatory Visit (INDEPENDENT_AMBULATORY_CARE_PROVIDER_SITE_OTHER): Payer: Federal, State, Local not specified - PPO

## 2018-05-14 DIAGNOSIS — J309 Allergic rhinitis, unspecified: Secondary | ICD-10-CM

## 2018-05-26 ENCOUNTER — Ambulatory Visit (INDEPENDENT_AMBULATORY_CARE_PROVIDER_SITE_OTHER): Payer: Federal, State, Local not specified - PPO | Admitting: *Deleted

## 2018-05-26 DIAGNOSIS — J309 Allergic rhinitis, unspecified: Secondary | ICD-10-CM

## 2018-06-05 ENCOUNTER — Ambulatory Visit (INDEPENDENT_AMBULATORY_CARE_PROVIDER_SITE_OTHER): Payer: Federal, State, Local not specified - PPO

## 2018-06-05 DIAGNOSIS — J309 Allergic rhinitis, unspecified: Secondary | ICD-10-CM

## 2018-06-16 ENCOUNTER — Ambulatory Visit (INDEPENDENT_AMBULATORY_CARE_PROVIDER_SITE_OTHER): Payer: Federal, State, Local not specified - PPO | Admitting: Family Medicine

## 2018-06-16 ENCOUNTER — Encounter: Payer: Self-pay | Admitting: Family Medicine

## 2018-06-16 VITALS — BP 124/69 | HR 66 | Temp 97.4°F | Ht 62.0 in | Wt 187.5 lb

## 2018-06-16 DIAGNOSIS — Z0279 Encounter for issue of other medical certificate: Secondary | ICD-10-CM | POA: Insufficient documentation

## 2018-06-16 NOTE — Progress Notes (Signed)
Virtual visit completed through [employer]. Due to national recommendations of social distancing due to [place] 19, a virtual visit is felt to be most appropriate for this patient at this time.   Patient location: home Provider location: [facility], office If any vitals were documented, they were collected by patient at home unless specified below.    BP 124/69 (BP Location: Left Arm, Patient Position: Sitting, Cuff Size: Normal)   Pulse 66   Temp (!) 97.4 F (36.3 C) (Oral)   Ht 5\' 2"  (1.575 m)   Wt 187 lb 8 oz (85 kg)   LMP 06/02/2018   BMI 34.29 kg/m    CC: discuss work note Subjective:    Patient ID: Sabrina Smith, female    DOB: [birth year], 38 y.o.   MRN: [id number]  HPI: Sabrina Smith is a 38 y.o. female presenting on 06/16/2018 for Discuss Work Note   Works at post office - stressful time. Being asked to increase hours at work as well as go to new sites with new co workers - more hours including weekends. She cares for 27yo daughter at home who is asthmatic and out of school likely for the rest of the school year.   She is currently asymptomatic. No known covid exposure.       Relevant past medical, surgical, family and social history reviewed and updated as indicated. Interim medical history since our last visit reviewed. Allergies and medications reviewed and updated. Outpatient Medications Prior to Visit  Medication Sig Dispense Refill  . albuterol (PROAIR HFA) 108 (90 Base) MCG/ACT inhaler Inhale 2 puffs into the lungs every 4 (four) hours as needed. 1 Inhaler 1  . amLODipine (NORVASC) 5 MG tablet Take 1 tablet (5 mg total) by mouth daily. 90 tablet 3  . Boric Acid CRYS Place 600 mg vaginally 2 (two) times a week. 500 g 5  . EPINEPHrine (AUVI-Q) 0.3 mg/0.3 mL IJ SOAJ injection Use as directed for severe allergic reaction 2 Device 1  . mometasone (NASONEX) 50 MCG/ACT nasal spray Place 2 sprays into the nose daily. 17 g 5  . montelukast (SINGULAIR) 10  MG tablet Take 1 tablet (10 mg total) by mouth at bedtime. 30 tablet 5  . omeprazole (PRILOSEC) 40 MG capsule Take 1 capsule (40 mg total) by mouth daily as needed (heartburn). 30 capsule 3  . polyethylene glycol-electrolytes (NULYTELY/GOLYTELY) 420 g solution      No facility-administered medications prior to visit.      Per HPI unless specifically indicated in ROS section below Review of Systems Objective:    BP 124/69 (BP Location: Left Arm, Patient Position: Sitting, Cuff Size: Normal)   Pulse 66   Temp (!) 97.4 F (36.3 C) (Oral)   Ht 5\' 2"  (1.575 m)   Wt 187 lb 8 oz (85 kg)   LMP 06/02/2018   BMI 34.29 kg/m   Wt Readings from Last 3 Encounters:  06/16/18 187 lb 8 oz (85 kg)  05/07/18 191 lb 12.8 oz (87 kg)  03/14/18 189 lb (85.7 kg)     Physical exam: Gen: alert, NAD, not ill appearing Pulm: speaks in complete sentences without increased work of breathing Psych: normal mood, normal thought content       Assessment & Plan:   Problem List Items Addressed This Visit    None       No orders of the defined types were placed in this encounter.  No orders of the defined types were  placed in this encounter.   Follow up plan: No follow-ups on file.  Ria Bush, MD

## 2018-06-16 NOTE — Assessment & Plan Note (Signed)
Requests letter for work which was provided today.

## 2018-06-17 ENCOUNTER — Ambulatory Visit (INDEPENDENT_AMBULATORY_CARE_PROVIDER_SITE_OTHER): Payer: Federal, State, Local not specified - PPO | Admitting: *Deleted

## 2018-06-17 DIAGNOSIS — J309 Allergic rhinitis, unspecified: Secondary | ICD-10-CM

## 2018-06-24 ENCOUNTER — Ambulatory Visit (INDEPENDENT_AMBULATORY_CARE_PROVIDER_SITE_OTHER): Payer: Federal, State, Local not specified - PPO | Admitting: *Deleted

## 2018-06-24 DIAGNOSIS — J309 Allergic rhinitis, unspecified: Secondary | ICD-10-CM

## 2018-07-03 ENCOUNTER — Ambulatory Visit (INDEPENDENT_AMBULATORY_CARE_PROVIDER_SITE_OTHER): Payer: Federal, State, Local not specified - PPO

## 2018-07-03 DIAGNOSIS — J309 Allergic rhinitis, unspecified: Secondary | ICD-10-CM

## 2018-07-10 ENCOUNTER — Ambulatory Visit (INDEPENDENT_AMBULATORY_CARE_PROVIDER_SITE_OTHER): Payer: Federal, State, Local not specified - PPO

## 2018-07-10 DIAGNOSIS — J309 Allergic rhinitis, unspecified: Secondary | ICD-10-CM

## 2018-07-28 ENCOUNTER — Ambulatory Visit (INDEPENDENT_AMBULATORY_CARE_PROVIDER_SITE_OTHER): Payer: Federal, State, Local not specified - PPO

## 2018-07-28 DIAGNOSIS — J309 Allergic rhinitis, unspecified: Secondary | ICD-10-CM | POA: Diagnosis not present

## 2018-08-05 ENCOUNTER — Ambulatory Visit (INDEPENDENT_AMBULATORY_CARE_PROVIDER_SITE_OTHER): Payer: Federal, State, Local not specified - PPO | Admitting: *Deleted

## 2018-08-05 DIAGNOSIS — J309 Allergic rhinitis, unspecified: Secondary | ICD-10-CM

## 2018-08-11 DIAGNOSIS — L259 Unspecified contact dermatitis, unspecified cause: Secondary | ICD-10-CM | POA: Diagnosis not present

## 2018-08-18 ENCOUNTER — Ambulatory Visit (INDEPENDENT_AMBULATORY_CARE_PROVIDER_SITE_OTHER): Payer: Federal, State, Local not specified - PPO

## 2018-08-18 DIAGNOSIS — J309 Allergic rhinitis, unspecified: Secondary | ICD-10-CM | POA: Diagnosis not present

## 2018-08-26 ENCOUNTER — Ambulatory Visit (INDEPENDENT_AMBULATORY_CARE_PROVIDER_SITE_OTHER): Payer: Federal, State, Local not specified - PPO | Admitting: *Deleted

## 2018-08-26 DIAGNOSIS — J309 Allergic rhinitis, unspecified: Secondary | ICD-10-CM | POA: Diagnosis not present

## 2018-09-03 ENCOUNTER — Ambulatory Visit (INDEPENDENT_AMBULATORY_CARE_PROVIDER_SITE_OTHER): Payer: Federal, State, Local not specified - PPO | Admitting: *Deleted

## 2018-09-03 DIAGNOSIS — J309 Allergic rhinitis, unspecified: Secondary | ICD-10-CM

## 2018-09-15 ENCOUNTER — Ambulatory Visit (INDEPENDENT_AMBULATORY_CARE_PROVIDER_SITE_OTHER): Payer: Federal, State, Local not specified - PPO | Admitting: *Deleted

## 2018-09-15 DIAGNOSIS — J309 Allergic rhinitis, unspecified: Secondary | ICD-10-CM | POA: Diagnosis not present

## 2018-09-23 ENCOUNTER — Ambulatory Visit (INDEPENDENT_AMBULATORY_CARE_PROVIDER_SITE_OTHER): Payer: Federal, State, Local not specified - PPO

## 2018-09-23 DIAGNOSIS — J309 Allergic rhinitis, unspecified: Secondary | ICD-10-CM

## 2018-09-30 ENCOUNTER — Ambulatory Visit (INDEPENDENT_AMBULATORY_CARE_PROVIDER_SITE_OTHER): Payer: Federal, State, Local not specified - PPO | Admitting: *Deleted

## 2018-09-30 DIAGNOSIS — J309 Allergic rhinitis, unspecified: Secondary | ICD-10-CM | POA: Diagnosis not present

## 2018-10-06 ENCOUNTER — Encounter: Payer: Self-pay | Admitting: Allergy and Immunology

## 2018-10-06 ENCOUNTER — Other Ambulatory Visit: Payer: Self-pay

## 2018-10-06 ENCOUNTER — Ambulatory Visit (INDEPENDENT_AMBULATORY_CARE_PROVIDER_SITE_OTHER): Payer: Federal, State, Local not specified - PPO | Admitting: Allergy and Immunology

## 2018-10-06 VITALS — BP 100/60 | HR 57 | Temp 97.4°F | Resp 16 | Ht 62.0 in | Wt 184.6 lb

## 2018-10-06 DIAGNOSIS — H6983 Other specified disorders of Eustachian tube, bilateral: Secondary | ICD-10-CM

## 2018-10-06 DIAGNOSIS — J4520 Mild intermittent asthma, uncomplicated: Secondary | ICD-10-CM

## 2018-10-06 DIAGNOSIS — H6980 Other specified disorders of Eustachian tube, unspecified ear: Secondary | ICD-10-CM | POA: Insufficient documentation

## 2018-10-06 DIAGNOSIS — J3089 Other allergic rhinitis: Secondary | ICD-10-CM

## 2018-10-06 DIAGNOSIS — H6993 Unspecified Eustachian tube disorder, bilateral: Secondary | ICD-10-CM

## 2018-10-06 DIAGNOSIS — J0100 Acute maxillary sinusitis, unspecified: Secondary | ICD-10-CM

## 2018-10-06 DIAGNOSIS — T781XXD Other adverse food reactions, not elsewhere classified, subsequent encounter: Secondary | ICD-10-CM

## 2018-10-06 DIAGNOSIS — H6990 Unspecified Eustachian tube disorder, unspecified ear: Secondary | ICD-10-CM | POA: Insufficient documentation

## 2018-10-06 MED ORDER — XHANCE 93 MCG/ACT NA EXHU: 2.0000 | INHALANT_SUSPENSION | Freq: Two times a day (BID) | NASAL | 5 refills | Status: DC

## 2018-10-06 NOTE — Patient Instructions (Addendum)
Acute maxillary sinusitis  Prednisone has been provided, 40 mg x3 days, 20 mg x1 day, 10 mg x1 day, then stop.  A prescription has been provided for [facility]. for now, this medication will be used 2 actuations per nostril twice a day.  Once symptoms have returned to baseline, this nasal spray may be used as needed.  Proper technique has been discussed and demonstrated.  Nasal saline lavage (NeilMed) has been recommended as needed and prior to medicated nasal sprays along with instructions for proper administration.  Eustachian tube dysfunction  Treatment plan as outlined above for acute sinusitis.  If this problem persists or progresses, consider ENT evaluation.  Allergic rhinitis  Continue appropriate allergen avoidance measures and immunotherapy injections per protocol.  Xhance and nasal saline lavage (as above).  Adverse food reaction  Continue to avoid soy, sesame, and pistachio and have access to epinephrine autoinjectors.  Mild intermittent asthma without complication Well-controlled.  Continue albuterol HFA, 1 to 2 inhalations every 4-6 hours as needed.  Subjective and objective measures of pulmonary function will be followed and the treatment plan will be adjusted accordingly.   Return in about 5 months (around 03/08/2019), or if symptoms worsen or fail to improve.

## 2018-10-06 NOTE — Progress Notes (Signed)
Follow-up Note  RE: Sabrina Smith MRN: [id number] DOB: February 21, 1981 Date of Office Visit: 10/06/2018  Primary care provider: Ria Bush, MD Referring provider: Ria Bush, MD  History of present illness: Sabrina Smith is a 38 y.o. female with asthma, allergic rhinitis, and history of adverse food reaction presenting today for sick visit.  She is previously seen in this clinic on May 07, 2018 by Dr. Maudie Mercury.  She reports that approximately 2 weeks ago she began to experience sinus pressure and ear pressure.  These symptoms have progressed over the past 2 days prompting an acute visit today.  She denies fevers, chills, and discolored mucus production.  She reports that she has been using fluticasone nasal spray sporadically.  She reports that her asthma has been well controlled but has asthma on hand "just in case."  She has not required albuterol rescue recently nor has she experienced limitations in normal daily activities or nocturnal awakenings due to lower respiratory symptoms.  She avoids soy, sesame, and pistachios.  Assessment and plan: Acute maxillary sinusitis  Prednisone has been provided, 40 mg x3 days, 20 mg x1 day, 10 mg x1 day, then stop.  A prescription has been provided for [facility]. for now, this medication will be used 2 actuations per nostril twice a day.  Once symptoms have returned to baseline, this nasal spray may be used as needed.  Proper technique has been discussed and demonstrated.  Nasal saline lavage (NeilMed) has been recommended as needed and prior to medicated nasal sprays along with instructions for proper administration.  Eustachian tube dysfunction  Treatment plan as outlined above for acute sinusitis.  If this problem persists or progresses, consider ENT evaluation.  Allergic rhinitis  Continue appropriate allergen avoidance measures and immunotherapy injections per protocol.  Xhance and nasal saline lavage (as above).  Adverse food  reaction  Continue to avoid soy, sesame, and pistachio and have access to epinephrine autoinjectors.  Mild intermittent asthma without complication Well-controlled.  Continue albuterol HFA, 1 to 2 inhalations every 4-6 hours as needed.  Subjective and objective measures of pulmonary function will be followed and the treatment plan will be adjusted accordingly.   Meds ordered this encounter  Medications  . Fluticasone Propionate (XHANCE) 93 MCG/ACT EXHU    Sig: Place 2 sprays into the nose 2 (two) times daily.    Dispense:  32 mL    Refill:  5    Diagnostics: Spirometry:  Normal with an FEV1 of 113% predicted. This study was performed while the patient was asymptomatic.  Please see scanned spirometry results for details.    Physical examination: Blood pressure 100/60, pulse (!) 57, temperature (!) 97.4 F (36.3 C), temperature source Temporal, resp. rate 16, height 5\' 2"  (1.575 m), weight 184 lb 9.6 oz (83.7 kg), SpO2 95 %.  General: Alert, interactive, in no acute distress. HEENT: TMs pearly gray, turbinates markedly edematous with thick discharge, post-pharynx moderately erythematous. Neck: Supple without lymphadenopathy. Lungs: Clear to auscultation without wheezing, rhonchi or rales. CV: Normal S1, S2 without murmurs. Skin: Warm and dry, without lesions or rashes.  The following portions of the patient's history were reviewed and updated as appropriate: allergies, current medications, past family history, past medical history, past social history, past surgical history and problem list.  Allergies as of 10/06/2018      Reactions   Other    Soybean, Almond, Hazelnut   Sulfa Drugs Cross Reactors Hives      Medication List  Accurate as of October 06, 2018  6:22 PM. If you have any questions, ask your nurse or doctor.        STOP taking these medications   montelukast 10 MG tablet Commonly known as: SINGULAIR Stopped by: Edmonia Lynch, MD     TAKE these  medications   albuterol 108 (90 Base) MCG/ACT inhaler Commonly known as: ProAir HFA Inhale 2 puffs into the lungs every 4 (four) hours as needed.   amLODipine 5 MG tablet Commonly known as: NORVASC Take 1 tablet (5 mg total) by mouth daily.   Boric Acid Crys Place 600 mg vaginally 2 (two) times a week.   EPINEPHrine 0.3 mg/0.3 mL Soaj injection Commonly known as: Auvi-Q Use as directed for severe allergic reaction   mometasone 50 MCG/ACT nasal spray Commonly known as: NASONEX Place 2 sprays into the nose daily.   omeprazole 40 MG capsule Commonly known as: PRILOSEC Take 1 capsule (40 mg total) by mouth daily as needed (heartburn).   polyethylene glycol-electrolytes 420 g solution Commonly known as: NuLYTELY/GoLYTELY   triamcinolone cream 0.1 % Commonly known as: KENALOG APPLY ON THE SKIN TWICE A DAY AS NEEDED FOR RASH   Xhance 93 MCG/ACT Exhu Generic drug: Fluticasone Propionate Place 2 sprays into the nose 2 (two) times daily. Started by: Edmonia Lynch, MD       Allergies  Allergen Reactions  . Other     Soybean, Almond, Hazelnut  . Sulfa Drugs Cross Reactors Hives   Review of systems: Review of systems negative except as noted in HPI / PMHx or noted below: Constitutional: Negative.  HENT: Negative.   Eyes: Negative.  Respiratory: Negative.   Cardiovascular: Negative.  Gastrointestinal: Negative.  Genitourinary: Negative.  Musculoskeletal: Negative.  Neurological: Negative.  Endo/Heme/Allergies: Negative.  Cutaneous: Negative.  Past Medical History:  Diagnosis Date  . Allergic rhinitis    severe ([place])  . Asthma    as a child  . History of chicken pox   . Shingles 1990s    Family History  Problem Relation Age of Onset  . Diabetes Maternal Aunt   . Hypertension Maternal Aunt   . Diabetes Maternal Uncle   . Hypertension Maternal Uncle   . Stroke Maternal Uncle   . Heart disease Maternal Grandmother   . Diabetes Maternal Grandfather    . Cancer Maternal Grandfather        prostate  . Hypertension Mother   . Hypertension Brother   . Asthma Daughter   . Eczema Daughter   . Allergic rhinitis Neg Hx   . Urticaria Neg Hx     Social History   Socioeconomic History  . Marital status: Single    Spouse name: Not on file  . Number of children: Not on file  . Years of education: Not on file  . Highest education level: Not on file  Occupational History  . Occupation: Chemical engineer: [employer]  Social Needs  . Financial resource strain: Not on file  . Food insecurity    Worry: Not on file    Inability: Not on file  . Transportation needs    Medical: Not on file    Non-medical: Not on file  Tobacco Use  . Smoking status: Former Smoker    Quit date: 09/27/2010    Years since quitting: 8.0  . Smokeless tobacco: Never Used  Substance and Sexual Activity  . Alcohol use: Yes    Comment: Occasional  . Drug  use: No  . Sexual activity: Yes    Birth control/protection: None  Lifestyle  . Physical activity    Days per week: Not on file    Minutes per session: Not on file  . Stress: Not on file  Relationships  . Social Herbalist on phone: Not on file    Gets together: Not on file    Attends religious service: Not on file    Active member of club or organization: Not on file    Attends meetings of clubs or organizations: Not on file    Relationship status: Not on file  . Intimate partner violence    Fear of current or ex partner: Not on file    Emotionally abused: Not on file    Physically abused: Not on file    Forced sexual activity: Not on file  Other Topics Concern  . Not on file  Social History Narrative   Caffeine: none   Lives with daughter (2006), no pets   Occupation: Tour manager   Edu: bachelor's   Activity: treadmill and workout tapes   Diet: seldom water, fruits/vegetables daily, red meat rare, fish 3x/wk    I appreciate the opportunity to take part in Miyani's  care. Please do not hesitate to contact me with questions.  Sincerely,   R. Edgar Frisk, MD

## 2018-10-06 NOTE — Assessment & Plan Note (Signed)
   Prednisone has been provided, 40 mg x3 days, 20 mg x1 day, 10 mg x1 day, then stop.  A prescription has been provided for [facility]. for now, this medication will be used 2 actuations per nostril twice a day.  Once symptoms have returned to baseline, this nasal spray may be used as needed.  Proper technique has been discussed and demonstrated.  Nasal saline lavage (NeilMed) has been recommended as needed and prior to medicated nasal sprays along with instructions for proper administration.

## 2018-10-06 NOTE — Progress Notes (Signed)
VIALS EXP 10-06-2019

## 2018-10-06 NOTE — Assessment & Plan Note (Signed)
   Treatment plan as outlined above for acute sinusitis.  If this problem persists or progresses, consider ENT evaluation.

## 2018-10-06 NOTE — Assessment & Plan Note (Signed)
Well-controlled.  Continue albuterol HFA, 1 to 2 inhalations every 4-6 hours as needed.  Subjective and objective measures of pulmonary function will be followed and the treatment plan will be adjusted accordingly.

## 2018-10-06 NOTE — Assessment & Plan Note (Signed)
   Continue to avoid soy, sesame, and pistachio and have access to epinephrine autoinjectors.

## 2018-10-06 NOTE — Assessment & Plan Note (Signed)
   Continue appropriate allergen avoidance measures and immunotherapy injections per protocol.  Xhance and nasal saline lavage (as above).

## 2018-10-07 DIAGNOSIS — H93292 Other abnormal auditory perceptions, left ear: Secondary | ICD-10-CM | POA: Diagnosis not present

## 2018-10-07 DIAGNOSIS — J3089 Other allergic rhinitis: Secondary | ICD-10-CM | POA: Diagnosis not present

## 2018-10-07 DIAGNOSIS — H9203 Otalgia, bilateral: Secondary | ICD-10-CM | POA: Diagnosis not present

## 2018-10-08 ENCOUNTER — Ambulatory Visit (INDEPENDENT_AMBULATORY_CARE_PROVIDER_SITE_OTHER): Payer: Federal, State, Local not specified - PPO

## 2018-10-08 DIAGNOSIS — J309 Allergic rhinitis, unspecified: Secondary | ICD-10-CM | POA: Diagnosis not present

## 2018-10-14 ENCOUNTER — Ambulatory Visit (INDEPENDENT_AMBULATORY_CARE_PROVIDER_SITE_OTHER): Payer: Federal, State, Local not specified - PPO | Admitting: *Deleted

## 2018-10-14 DIAGNOSIS — J309 Allergic rhinitis, unspecified: Secondary | ICD-10-CM | POA: Diagnosis not present

## 2018-10-21 ENCOUNTER — Ambulatory Visit (INDEPENDENT_AMBULATORY_CARE_PROVIDER_SITE_OTHER): Payer: Federal, State, Local not specified - PPO | Admitting: *Deleted

## 2018-10-21 DIAGNOSIS — J309 Allergic rhinitis, unspecified: Secondary | ICD-10-CM | POA: Diagnosis not present

## 2018-10-28 ENCOUNTER — Ambulatory Visit (INDEPENDENT_AMBULATORY_CARE_PROVIDER_SITE_OTHER): Payer: Federal, State, Local not specified - PPO | Admitting: *Deleted

## 2018-10-28 DIAGNOSIS — J309 Allergic rhinitis, unspecified: Secondary | ICD-10-CM

## 2018-11-06 ENCOUNTER — Ambulatory Visit (INDEPENDENT_AMBULATORY_CARE_PROVIDER_SITE_OTHER): Payer: Federal, State, Local not specified - PPO | Admitting: *Deleted

## 2018-11-06 DIAGNOSIS — J309 Allergic rhinitis, unspecified: Secondary | ICD-10-CM

## 2018-11-10 ENCOUNTER — Ambulatory Visit: Payer: Federal, State, Local not specified - PPO | Admitting: Allergy

## 2018-11-10 NOTE — Progress Notes (Deleted)
Follow Up Note  RE: Sabrina Smith MRN: [id number] DOB: 1980/11/13 Date of Office Visit: 11/10/2018  Referring provider: Ria Bush, MD Primary care provider: Ria Bush, MD  Chief Complaint: No chief complaint on file.  History of Present Illness: I had the pleasure of seeing Sabrina Smith for a follow up visit at the [facility] [place] [facility] [place] on 11/10/2018. She is a 38 y.o. female, who is being followed for allergic rhinitis, adverse food reaction, asthma, eustachian tube dysfunction. Today she is here for regular follow up visit. Her previous allergy office visit was on 10/06/2018 with Dr. Verlin Fester.   Acute maxillary sinusitis  Prednisone has been provided, 40 mg x3 days, 20 mg x1 day, 10 mg x1 day, then stop.  A prescription has been provided for [facility]. for now, this medication will be used 2 actuations per nostril twice a day.  Once symptoms have returned to baseline, this nasal spray may be used as needed.  Proper technique has been discussed and demonstrated.  Nasal saline lavage (NeilMed) has been recommended as needed and prior to medicated nasal sprays along with instructions for proper administration.  Eustachian tube dysfunction  Treatment plan as outlined above for acute sinusitis.  If this problem persists or progresses, consider ENT evaluation.  Allergic rhinitis  Continue appropriate allergen avoidance measures and immunotherapy injections per protocol.  Xhance and nasal saline lavage (as above).  Adverse food reaction  Continue to avoid soy, sesame, and pistachio and have access to epinephrine autoinjectors.  ? bloodwork  Mild intermittent asthma without complication Well-controlled.  Continue albuterol HFA, 1 to 2 inhalations every 4-6 hours as needed.  Subjective and objective measures of pulmonary function will be followed and the treatment plan will be adjusted accordingly.  Assessment and Plan: Sabrina Smith is a 38 y.o. female  with: No problem-specific Assessment & Plan notes found for this encounter.  No follow-ups on file.  No orders of the defined types were placed in this encounter.  Lab Orders  No laboratory test(s) ordered today    Diagnostics: Spirometry:  Tracings reviewed. Her effort: {Blank single:19197::"Good reproducible efforts.","It was hard to get consistent efforts and there is a question as to whether this reflects a maximal maneuver.","Poor effort, data can not be interpreted."} FVC: ***L FEV1: ***L, ***% predicted FEV1/FVC ratio: ***% Interpretation: {Blank single:19197::"Spirometry consistent with mild obstructive disease","Spirometry consistent with moderate obstructive disease","Spirometry consistent with severe obstructive disease","Spirometry consistent with possible restrictive disease","Spirometry consistent with mixed obstructive and restrictive disease","Spirometry uninterpretable due to technique","Spirometry consistent with normal pattern","No overt abnormalities noted given today's efforts"}.  Please see scanned spirometry results for details.  Skin Testing: {Blank single:19197::"Select foods","Environmental allergy panel","Environmental allergy panel and select foods","Food allergy panel","None","Deferred due to recent antihistamines use"}. Positive test to: ***. Negative test to: ***.  Results discussed with patient/family.   Medication List:  Current Outpatient Medications  Medication Sig Dispense Refill  . albuterol (PROAIR HFA) 108 (90 Base) MCG/ACT inhaler Inhale 2 puffs into the lungs every 4 (four) hours as needed. 1 Inhaler 1  . amLODipine (NORVASC) 5 MG tablet Take 1 tablet (5 mg total) by mouth daily. 90 tablet 3  . Boric Acid CRYS Place 600 mg vaginally 2 (two) times a week. 500 g 5  . EPINEPHrine (AUVI-Q) 0.3 mg/0.3 mL IJ SOAJ injection Use as directed for severe allergic reaction 2 Device 1  . Fluticasone Propionate (XHANCE) 93 MCG/ACT EXHU Place 2 sprays into  the nose 2 (two) times daily. 32 mL 5  .  mometasone (NASONEX) 50 MCG/ACT nasal spray Place 2 sprays into the nose daily. 17 g 5  . omeprazole (PRILOSEC) 40 MG capsule Take 1 capsule (40 mg total) by mouth daily as needed (heartburn). 30 capsule 3  . polyethylene glycol-electrolytes (NULYTELY/GOLYTELY) 420 g solution     . triamcinolone cream (KENALOG) 0.1 % APPLY ON THE SKIN TWICE A DAY AS NEEDED FOR RASH     No current facility-administered medications for this visit.    Allergies: Allergies  Allergen Reactions  . Other     Soybean, Almond, Hazelnut  . Sulfa Drugs Cross Reactors Hives   I reviewed her past medical history, social history, family history, and environmental history and no significant changes have been reported from previous visit on 10/06/2018.  Review of Systems  Constitutional: Negative for appetite change, chills, fever and unexpected weight change.  HENT: Positive for congestion. Negative for rhinorrhea.   Eyes: Negative for itching.  Respiratory: Negative for cough, chest tightness, shortness of breath and wheezing.   Cardiovascular: Negative for chest pain.  Gastrointestinal: Negative for abdominal pain.  Genitourinary: Negative for difficulty urinating.  Skin: Negative for rash.  Allergic/Immunologic: Positive for environmental allergies and food allergies.  Neurological: Negative for headaches.   Objective: There were no vitals taken for this visit. There is no height or weight on file to calculate BMI. Physical Exam  Constitutional: She is oriented to person, place, and time. She appears well-developed and well-nourished.  HENT:  Head: Normocephalic and atraumatic.  Right Ear: External ear normal.  Left Ear: External ear normal.  Nose: Nose normal.  Mouth/Throat: Oropharynx is clear and moist.  Eyes: Conjunctivae and EOM are normal.  Neck: Neck supple.  Cardiovascular: Normal rate, regular rhythm and normal heart sounds. Exam reveals no gallop and no  friction rub.  No murmur heard. Pulmonary/Chest: Effort normal and breath sounds normal. She has no wheezes. She has no rales.  Abdominal: Soft.  Lymphadenopathy:    She has no cervical adenopathy.  Neurological: She is alert and oriented to person, place, and time.  Skin: Skin is warm. No rash noted.  Psychiatric: She has a normal mood and affect. Her behavior is normal.  Nursing note and vitals reviewed.  Previous notes and tests were reviewed. The plan was reviewed with the patient/family, and all questions/concerned were addressed.  It was my pleasure to see Sabrina Smith today and participate in her care. Please feel free to contact me with any questions or concerns.  Sincerely,  Rexene Alberts, DO Allergy & Immunology  [facility] office: [phone] [facility] office: [place] office: [phone]

## 2018-11-13 ENCOUNTER — Ambulatory Visit (INDEPENDENT_AMBULATORY_CARE_PROVIDER_SITE_OTHER): Payer: Federal, State, Local not specified - PPO

## 2018-11-13 DIAGNOSIS — J309 Allergic rhinitis, unspecified: Secondary | ICD-10-CM | POA: Diagnosis not present

## 2018-11-19 ENCOUNTER — Ambulatory Visit: Payer: Self-pay | Admitting: *Deleted

## 2018-11-19 ENCOUNTER — Other Ambulatory Visit: Payer: Self-pay

## 2018-11-19 ENCOUNTER — Ambulatory Visit (INDEPENDENT_AMBULATORY_CARE_PROVIDER_SITE_OTHER): Payer: Federal, State, Local not specified - PPO | Admitting: Allergy

## 2018-11-19 ENCOUNTER — Encounter: Payer: Self-pay | Admitting: Allergy

## 2018-11-19 VITALS — BP 118/68 | HR 66 | Temp 97.9°F | Resp 16 | Ht 62.0 in | Wt 186.4 lb

## 2018-11-19 DIAGNOSIS — J309 Allergic rhinitis, unspecified: Secondary | ICD-10-CM

## 2018-11-19 DIAGNOSIS — J302 Other seasonal allergic rhinitis: Secondary | ICD-10-CM

## 2018-11-19 DIAGNOSIS — J3089 Other allergic rhinitis: Secondary | ICD-10-CM

## 2018-11-19 DIAGNOSIS — T781XXD Other adverse food reactions, not elsewhere classified, subsequent encounter: Secondary | ICD-10-CM | POA: Diagnosis not present

## 2018-11-19 DIAGNOSIS — J4520 Mild intermittent asthma, uncomplicated: Secondary | ICD-10-CM

## 2018-11-19 NOTE — Assessment & Plan Note (Addendum)
   Continue to avoid soy, sesame and pistachio.  Bloodwork was not drawn to date.   Continue to avoid soy, sesame, and pistachio and have access to epinephrine autoinjectors.

## 2018-11-19 NOTE — Patient Instructions (Addendum)
Adverse food reaction  Continue to avoid soy, sesame and pistachio.  For mild symptoms you can take over the counter antihistamines such as Benadryl and monitor symptoms closely. If symptoms worsen or if you have severe symptoms including breathing issues, throat closure, significant swelling, whole body hives, severe diarrhea and vomiting, lightheadedness then inject epinephrine and seek immediate medical care afterwards.  Pollen-food allergy  Continue to avoid tree nuts, soy and apples.  Mild intermittent asthma without complication  May use albuterol rescue inhaler 2 puffs or nebulizer every 4 to 6 hours as needed for shortness of breath, chest tightness, coughing, and wheezing. May use albuterol rescue inhaler 2 puffs 5 to 15 minutes prior to strenuous physical activities. Monitor frequency of use.   Perennial allergic rhinitis with seasonal variation 2019  skin testing showed: positive grass, ragweed, weed, trees, mold, dust mites, cockroaches.   Use Xhance 2 sprays per nostril 1-2 times a day more consistently on the right side.   Continue environmental control measures.  Continue allergy injections.   May use over the counter antihistamines such as Zyrtec (cetirizine), Claritin (loratadine), Allegra (fexofenadine), or Xyzal (levocetirizine) daily as needed.  Follow up in 6 months or sooner if needed.

## 2018-11-19 NOTE — Assessment & Plan Note (Signed)
Past history - Perennial rhinoconjunctivitis symptoms for the past many years with worsening in the spring.  Patient used over-the-counter antihistamines, Singulair and Flonase as needed with some benefit.  2014 skin testing was positive to grass, ragweed, weeds, trees, mold, dust mites, horse and cockroach.  She was on allergy immunotherapy from 2016-2017 (GT + WDM) but stopped due to scheduling issues. Patient follows with ENT. 2019 skin testing showed: positive grass, ragweed, weed, trees, mold, dust mites, cockroaches.  2019  skin testing showed: positive grass, ragweed, weed, trees, mold, dust mites, cockroaches. Started AIT on Nov 2019 (Mold-Cockroach-Dmite & Grass-Ragweed-Weed-Tree) Interim history - Tolerating AIT and noticed less sinus infection/nasal congestion.   Use Xhance 2 sprays per nostril 1-2 times a day more consistently on the right side.   Continue environmental control measures.  Continue allergy injections.   May use over the counter antihistamines such as Zyrtec (cetirizine), Claritin (loratadine), Allegra (fexofenadine), or Xyzal (levocetirizine) daily as needed.

## 2018-11-19 NOTE — Assessment & Plan Note (Signed)
Well-controlled.  May use albuterol rescue inhaler 2 puffs or nebulizer every 4 to 6 hours as needed for shortness of breath, chest tightness, coughing, and wheezing. May use albuterol rescue inhaler 2 puffs 5 to 15 minutes prior to strenuous physical activities. Monitor frequency of use.   Get spirometry at next visit.

## 2018-11-19 NOTE — Assessment & Plan Note (Signed)
Past history - Oral pruritus with soy, fresh apples and certain tree nuts. 2014 skin testing was mildly positive to soy, almond and hazelnuts.  Continue to avoid tree nuts, soy and apples.

## 2018-11-19 NOTE — Progress Notes (Signed)
Follow Up Note  RE: Sabrina Smith MRN: [id number] DOB: 1980/04/18 Date of Office Visit: 11/19/2018  Referring provider: Ria Bush, MD Primary care provider: Ria Bush, MD  Chief Complaint: Food Intolerance (soy, tree nuts, no accidental exposures) and Allergic Rhinitis  (doing okay)  History of Present Illness: I had the pleasure of seeing Sabrina Smith for a follow up visit at the [facility] on 11/19/2018. She is a 38 y.o. female, who is being followed for allergic rhinitis, adverse food reaction, asthma. Today she is here for regular follow up visit. Her previous allergy office visit was on 10/06/2018 with Dr. Verlin Fester.   Allergic rhinitis Tolerating weekly injections with no issues. Takes Xyzal sometimes and using Xhance as needed a few times a week with good benefit. No nosebleeds. She likes the [place] better than her previous nasal spray.   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  Adverse food reaction Currently avoiding soy, sesame, and pistachio and has access to epinephrine autoinjectors.  Mild intermittent asthma without complication Denies any SOB, coughing, wheezing, chest tightness, nocturnal awakenings, ER/urgent care visits or prednisone use since the last visit. No albuterol use since the last visit.   Assessment and Plan: Sabrina Smith is a 38 y.o. female with: Perennial allergic rhinitis with seasonal variation Past history - Perennial rhinoconjunctivitis symptoms for the past many years with worsening in the spring.  Patient used over-the-counter antihistamines, Singulair and Flonase as needed with some benefit.  2014 skin testing was positive to grass, ragweed, weeds, trees, mold, dust mites, horse and cockroach.  She was on allergy immunotherapy from 2016-2017 (GT + WDM) but stopped due to scheduling issues. Patient follows with ENT. 2019 skin testing showed: positive grass, ragweed, weed,  trees, mold, dust mites, cockroaches.  2019  skin testing showed: positive grass, ragweed, weed, trees, mold, dust mites, cockroaches. Started AIT on Nov 2019 (Mold-Cockroach-Dmite & Grass-Ragweed-Weed-Tree) Interim history - Tolerating AIT and noticed less sinus infection/nasal congestion.   Use Xhance 2 sprays per nostril 1-2 times a day more consistently on the right side.   Continue environmental control measures.  Continue allergy injections.   May use over the counter antihistamines such as Zyrtec (cetirizine), Claritin (loratadine), Allegra (fexofenadine), or Xyzal (levocetirizine) daily as needed.  Adverse food reaction  Continue to avoid soy, sesame and pistachio.  Bloodwork was not drawn to date.   Continue to avoid soy, sesame, and pistachio and have access to epinephrine autoinjectors.  Pollen-food allergy Past history - Oral pruritus with soy, fresh apples and certain tree nuts. 2014 skin testing was mildly positive to soy, almond and hazelnuts.  Continue to avoid tree nuts, soy and apples.  Mild intermittent asthma without complication Well-controlled.  May use albuterol rescue inhaler 2 puffs or nebulizer every 4 to 6 hours as needed for shortness of breath, chest tightness, coughing, and wheezing. May use albuterol rescue inhaler 2 puffs 5 to 15 minutes prior to strenuous physical activities. Monitor frequency of use.   Get spirometry at next visit.  Return in about 6 months (around 05/19/2019).  Diagnostics: None.   Medication List:  Current Outpatient Medications  Medication Sig Dispense Refill  . albuterol (PROAIR HFA) 108 (90 Base) MCG/ACT inhaler Inhale 2 puffs into the lungs every 4 (four) hours as needed. 1 Inhaler 1  . amLODipine (NORVASC) 5 MG tablet Take 1 tablet (5 mg total) by mouth daily. 90 tablet 3  . Boric Acid  CRYS Place 600 mg vaginally 2 (two) times a week. 500 g 5  . EPINEPHrine (AUVI-Q) 0.3 mg/0.3 mL IJ SOAJ injection Use as directed for  severe allergic reaction 2 Device 1  . Fluticasone Propionate (XHANCE) 93 MCG/ACT EXHU Place 2 sprays into the nose 2 (two) times daily. 32 mL 5  . omeprazole (PRILOSEC) 40 MG capsule Take 1 capsule (40 mg total) by mouth daily as needed (heartburn). 30 capsule 3   No current facility-administered medications for this visit.    Allergies: Allergies  Allergen Reactions  . Other     Soybean, Almond, Hazelnut  . Sulfa Drugs Cross Reactors Hives   I reviewed her past medical history, social history, family history, and environmental history and no significant changes have been reported from previous visit on 10/06/2018.  Review of Systems  Constitutional: Negative for appetite change, chills, fever and unexpected weight change.  HENT: Negative for congestion and rhinorrhea.   Eyes: Negative for itching.  Respiratory: Negative for cough, chest tightness, shortness of breath and wheezing.   Cardiovascular: Negative for chest pain.  Gastrointestinal: Negative for abdominal pain.  Genitourinary: Negative for difficulty urinating.  Skin: Negative for rash.  Allergic/Immunologic: Positive for environmental allergies and food allergies.  Neurological: Negative for headaches.   Objective: BP 118/68 (BP Location: Left Arm, Patient Position: Sitting, Cuff Size: Large)   Pulse 66   Temp 97.9 F (36.6 C) (Temporal)   Resp 16   Ht 5\' 2"  (1.575 m)   Wt 186 lb 6.4 oz (84.6 kg)   SpO2 97%   BMI 34.09 kg/m  Body mass index is 34.09 kg/m. Physical Exam  Constitutional: She is oriented to person, place, and time. She appears well-developed and well-nourished.  HENT:  Head: Normocephalic and atraumatic.  Right Ear: External ear normal.  Left Ear: External ear normal.  Mouth/Throat: Oropharynx is clear and moist.  Right nasal turbinate hypertrophy  Eyes: Conjunctivae and EOM are normal.  Neck: Neck supple.  Cardiovascular: Normal rate, regular rhythm and normal heart sounds. Exam reveals no  gallop and no friction rub.  No murmur heard. Pulmonary/Chest: Effort normal and breath sounds normal. She has no wheezes. She has no rales.  Abdominal: Soft.  Neurological: She is alert and oriented to person, place, and time.  Skin: Skin is warm. No rash noted.  Psychiatric: She has a normal mood and affect. Her behavior is normal.  Nursing note and vitals reviewed.  Previous notes and tests were reviewed. The plan was reviewed with the patient/family, and all questions/concerned were addressed.  It was my pleasure to see Sabrina Smith today and participate in her care. Please feel free to contact me with any questions or concerns.  Sincerely,  Rexene Alberts, DO Allergy & Immunology  [facility] office: [phone] [facility] office: [place] office: [phone]

## 2018-11-24 ENCOUNTER — Ambulatory Visit (INDEPENDENT_AMBULATORY_CARE_PROVIDER_SITE_OTHER): Payer: Federal, State, Local not specified - PPO

## 2018-11-24 DIAGNOSIS — J309 Allergic rhinitis, unspecified: Secondary | ICD-10-CM

## 2018-12-02 ENCOUNTER — Ambulatory Visit (INDEPENDENT_AMBULATORY_CARE_PROVIDER_SITE_OTHER): Payer: Federal, State, Local not specified - PPO

## 2018-12-02 DIAGNOSIS — J309 Allergic rhinitis, unspecified: Secondary | ICD-10-CM | POA: Diagnosis not present

## 2018-12-23 DIAGNOSIS — Z20828 Contact with and (suspected) exposure to other viral communicable diseases: Secondary | ICD-10-CM | POA: Diagnosis not present

## 2018-12-27 DIAGNOSIS — Z20828 Contact with and (suspected) exposure to other viral communicable diseases: Secondary | ICD-10-CM | POA: Diagnosis not present

## 2019-01-16 DIAGNOSIS — B373 Candidiasis of vulva and vagina: Secondary | ICD-10-CM | POA: Diagnosis not present

## 2019-01-16 DIAGNOSIS — N39498 Other specified urinary incontinence: Secondary | ICD-10-CM | POA: Diagnosis not present

## 2019-02-13 ENCOUNTER — Other Ambulatory Visit: Payer: Self-pay | Admitting: Allergy and Immunology

## 2019-03-02 DIAGNOSIS — Z20828 Contact with and (suspected) exposure to other viral communicable diseases: Secondary | ICD-10-CM | POA: Diagnosis not present

## 2019-03-02 DIAGNOSIS — Z20822 Contact with and (suspected) exposure to covid-19: Secondary | ICD-10-CM | POA: Diagnosis not present

## 2019-03-04 DIAGNOSIS — L74519 Primary focal hyperhidrosis, unspecified: Secondary | ICD-10-CM | POA: Diagnosis not present

## 2019-03-25 ENCOUNTER — Ambulatory Visit: Payer: Federal, State, Local not specified - PPO | Admitting: Family Medicine

## 2019-03-25 ENCOUNTER — Other Ambulatory Visit: Payer: Self-pay

## 2019-03-25 ENCOUNTER — Encounter: Payer: Self-pay | Admitting: Family Medicine

## 2019-03-25 VITALS — BP 118/80 | HR 70 | Temp 97.7°F | Ht 62.0 in | Wt 188.2 lb

## 2019-03-25 DIAGNOSIS — Z23 Encounter for immunization: Secondary | ICD-10-CM

## 2019-03-25 DIAGNOSIS — F439 Reaction to severe stress, unspecified: Secondary | ICD-10-CM | POA: Diagnosis not present

## 2019-03-25 NOTE — Patient Instructions (Signed)
Hang in there!  2020 has been a stressful year!  Work on self -care (spending time doing thinks you enjoy doing) as a healthy stress relief.  We will also refer you to counseling - let us know if you don't hear from [place] by the end of this week.

## 2019-03-25 NOTE — Progress Notes (Signed)
This visit was conducted in person.  BP 118/80 (BP Location: Left Arm, Patient Position: Sitting, Cuff Size: Large)   Pulse 70   Temp 97.7 F (36.5 C) (Temporal)   Ht 5\' 2"  (1.575 m)   Wt 188 lb 3 oz (85.4 kg)   LMP 03/17/2019   SpO2 100%   BMI 34.42 kg/m    CC: discuss counseling Subjective:    Patient ID: Sabrina Smith, female    DOB: 04-01-1980, 38 y.o.   MRN: [id number]  HPI: Sabrina Smith is a 39 y.o. female presenting on 03/25/2019 for Referral (Requesting referral for counseling. )   Stressful year- 16 yo daughter in virtual school for the past year. She has to help her more than normal due to this. This has been a struggle. Daughter goes to [employer].  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  Noticing increasing irritability at home and work.  Just coming off vacation for the past 1.5 wks has helped.       Relevant past medical, surgical, family and social history reviewed and updated as indicated. Interim medical history since our last visit reviewed. Allergies and medications reviewed and updated. Outpatient Medications Prior to Visit  Medication Sig Dispense Refill  . albuterol (PROAIR HFA) 108 (90 Base) MCG/ACT inhaler Inhale 2 puffs into the lungs every 4 (four) hours as needed. 1 Inhaler 1  . amLODipine (NORVASC) 5 MG tablet Take 1 tablet (5 mg total) by mouth daily. 90 tablet 3  . Boric Acid CRYS Place 600 mg vaginally 2 (two) times a week. 500 g 5  . EPINEPHrine (AUVI-Q) 0.3 mg/0.3 mL IJ SOAJ injection Use as directed for severe allergic reaction 2 Device 1  . omeprazole (PRILOSEC) 40 MG capsule Take 1 capsule (40 mg total) by mouth daily as needed (heartburn). 30 capsule 3  . XHANCE 93 MCG/ACT EXHU BLOW 2 DOSES IN EACH NOSTRIL TWICE DAILY 32 mL 4   No facility-administered medications prior to visit.     Per HPI unless specifically indicated in ROS  section below Review of Systems Objective:    BP 118/80 (BP Location: Left Arm, Patient Position: Sitting, Cuff Size: Large)   Pulse 70   Temp 97.7 F (36.5 C) (Temporal)   Ht 5\' 2"  (1.575 m)   Wt 188 lb 3 oz (85.4 kg)   LMP 03/17/2019   SpO2 100%   BMI 34.42 kg/m   Wt Readings from Last 3 Encounters:  03/25/19 188 lb 3 oz (85.4 kg)  11/19/18 186 lb 6.4 oz (84.6 kg)  10/06/18 184 lb 9.6 oz (83.7 kg)    Physical Exam Vitals and nursing note reviewed.  Constitutional:      Appearance: Normal appearance. She is not ill-appearing.  Neurological:     Mental Status: She is alert.  Psychiatric:        Mood and Affect: Mood normal.        Behavior: Behavior normal.       Results for orders placed or performed during the hospital encounter of [id number]  Basic metabolic panel  Result Value Ref Range   Sodium 139 135 - 145 mmol/L   Potassium 3.4 (L) 3.5 - 5.1 mmol/L   Chloride 105 98 - 111 mmol/L   CO2 26 22 - 32 mmol/L   Glucose, Bld 109 (H) 70 - 99 mg/dL   BUN 10  6 - 20 mg/dL   Creatinine, Ser 0.72 0.44 - 1.00 mg/dL   Calcium 8.9 8.9 - 10.3 mg/dL   GFR calc non Af Amer >60 >60 mL/min   GFR calc Af Amer >60 >60 mL/min   Anion gap 8 5 - 15  CBC  Result Value Ref Range   WBC 10.9 (H) 4.0 - 10.5 K/uL   RBC 4.93 3.87 - 5.11 MIL/uL   Hemoglobin 13.9 12.0 - 15.0 g/dL   HCT 43.2 36.0 - 46.0 %   MCV 87.6 80.0 - 100.0 fL   MCH 28.2 26.0 - 34.0 pg   MCHC 32.2 30.0 - 36.0 g/dL   RDW 14.7 11.5 - 15.5 %   Platelets 266 150 - 400 K/uL   nRBC 0.0 0.0 - 0.2 %  Troponin I - ONCE - STAT  Result Value Ref Range   Troponin I <0.03 <0.03 ng/mL  Hepatic function panel  Result Value Ref Range   Total Protein 6.8 6.5 - 8.1 g/dL   Albumin 4.0 3.5 - 5.0 g/dL   AST 20 15 - 41 U/L   ALT 18 0 - 44 U/L   Alkaline Phosphatase 52 38 - 126 U/L   Total Bilirubin 0.6 0.3 - 1.2 mg/dL   Bilirubin, Direct 0.1 0.0 - 0.2 mg/dL   Indirect Bilirubin 0.5 0.3 - 0.9 mg/dL  Lipase, blood  Result Value  Ref Range   Lipase 27 11 - 51 U/L  Pregnancy, urine POC  Result Value Ref Range   Preg Test, Ur NEGATIVE NEGATIVE   Lab Results  Component Value Date   VITAMINB12 727 04/16/2011    Lab Results  Component Value Date   TSH 1.92 11/01/2016    Depression screen PHQ 2/9 03/25/2019 11/01/2016  Decreased Interest 2 0  Down, Depressed, Hopeless 1 -  PHQ - 2 Score 3 0  Altered sleeping 1 -  Tired, decreased energy 2 -  Change in appetite 0 -  Feeling bad or failure about yourself  1 -  Trouble concentrating 0 -  Moving slowly or fidgety/restless 0 -  Suicidal thoughts 0 -  PHQ-9 Score 7 -    GAD 7 : Generalized Anxiety Score 03/25/2019  Nervous, Anxious, on Edge 1  Control/stop worrying 1  Worry too much - different things 1  Trouble relaxing 1  Restless 0  Easily annoyed or irritable 2  Afraid - awful might happen 0  Total GAD 7 Score 6   Assessment & Plan:  This visit occurred during the SARS-CoV-2 public health emergency.  Safety protocols were in place, including screening questions prior to the visit, additional usage of staff PPE, and extensive cleaning of exam room while observing appropriate contact time as indicated for disinfecting solutions.   Problem List Items Addressed This Visit    Stress - Primary    Reviewed recent stressors, discussed importance of healthy stress relieving strategies. Not currently interested in medication, nor do I think this is necessary. Reasonable to refer for counseling - pt agrees with plan. Low PHQ9, GAD7 scores.      Relevant Orders   Ambulatory referral to Psychology    Other Visit Diagnoses    Need for influenza vaccination       Relevant Orders   Flu Vaccine QUAD 36+ mos IM (Completed)       No orders of the defined types were placed in this encounter.  Orders Placed This Encounter  Procedures  . Flu Vaccine QUAD 36+ mos IM  .  Ambulatory referral to Psychology    Referral Priority:   Routine    Referral Type:   Psychiatric     Referral Reason:   Specialty Services Required    Requested Specialty:   Psychology    Number of Visits Requested:   1    Patient Instructions  Hang in there!  2020 has been a stressful year!  Work on self -care (spending time doing thinks you enjoy doing) as a healthy stress relief.  We will also refer you to counseling - let us know if you don't hear from [place] by the end of this week.    Follow up plan: No follow-ups on file.  Ria Bush, MD

## 2019-03-25 NOTE — Assessment & Plan Note (Addendum)
Reviewed recent stressors, discussed importance of healthy stress relieving strategies. Not currently interested in medication, nor do I think this is necessary. Reasonable to refer for counseling - pt agrees with plan. Low PHQ9, GAD7 scores.

## 2019-03-31 ENCOUNTER — Telehealth: Payer: Self-pay

## 2019-03-31 DIAGNOSIS — J3501 Chronic tonsillitis: Secondary | ICD-10-CM | POA: Diagnosis not present

## 2019-03-31 DIAGNOSIS — H9203 Otalgia, bilateral: Secondary | ICD-10-CM | POA: Diagnosis not present

## 2019-03-31 NOTE — Telephone Encounter (Signed)
Spoke with pt relaying Dr. Synthia Innocent message. Printed letter and placed at front office.  Pt verbalizes understanding.

## 2019-03-31 NOTE — Telephone Encounter (Signed)
Patient contacted the office. She was seen on 03/25/19 to discuss stress/anxiety. She is scheduled to see a therapist on 04/14/19 and she is scheduled to return to work tomorrow. Patient states she is feeling just very overwhelmed, anxious, and was very tearful on the phone. She states she is wondering if Dr. Darnell Level will write her a work note for the rest of this week so she can just pull her self through, closer to her appointment with her therapist - and that way she will just feel a little less anxious about her return to work. Please advise.

## 2019-03-31 NOTE — Telephone Encounter (Signed)
This is reasonable to do. plz write her a letter to be out of work this week with return on 04/06/2019.

## 2019-04-03 DIAGNOSIS — H66001 Acute suppurative otitis media without spontaneous rupture of ear drum, right ear: Secondary | ICD-10-CM | POA: Diagnosis not present

## 2019-04-03 DIAGNOSIS — R590 Localized enlarged lymph nodes: Secondary | ICD-10-CM | POA: Diagnosis not present

## 2019-04-03 DIAGNOSIS — J3489 Other specified disorders of nose and nasal sinuses: Secondary | ICD-10-CM | POA: Diagnosis not present

## 2019-04-06 DIAGNOSIS — L74519 Primary focal hyperhidrosis, unspecified: Secondary | ICD-10-CM | POA: Diagnosis not present

## 2019-04-14 ENCOUNTER — Ambulatory Visit (INDEPENDENT_AMBULATORY_CARE_PROVIDER_SITE_OTHER): Payer: Federal, State, Local not specified - PPO | Admitting: Psychology

## 2019-04-14 DIAGNOSIS — F4323 Adjustment disorder with mixed anxiety and depressed mood: Secondary | ICD-10-CM | POA: Diagnosis not present

## 2019-04-21 ENCOUNTER — Ambulatory Visit: Payer: Federal, State, Local not specified - PPO | Admitting: Obstetrics & Gynecology

## 2019-04-22 ENCOUNTER — Encounter: Payer: Self-pay | Admitting: Family Medicine

## 2019-04-22 ENCOUNTER — Ambulatory Visit: Payer: Federal, State, Local not specified - PPO | Admitting: Family Medicine

## 2019-04-22 ENCOUNTER — Other Ambulatory Visit: Payer: Self-pay

## 2019-04-22 VITALS — BP 98/60 | HR 74 | Temp 97.9°F | Ht 62.0 in | Wt 192.0 lb

## 2019-04-22 DIAGNOSIS — H6981 Other specified disorders of Eustachian tube, right ear: Secondary | ICD-10-CM | POA: Diagnosis not present

## 2019-04-22 NOTE — Patient Instructions (Signed)
Increase your nasal spray to twice daily for a week and then go back to once daily   If this does not help the ear- call and let us know   Drink plenty of fluids

## 2019-04-22 NOTE — Assessment & Plan Note (Signed)
R ear with symptoms of pressure/popping s/p tx for OM with amox from [facility] over 10 d ago  H/o allergies/congestion  Adv to inc her fluticasone ns dosing to bid for a week then return to daily  If no improvement -would consider prednisone inst to watch for new symptoms (headache/facial pain or hearing loss)  Update if not starting to improve in a week or if worsening

## 2019-04-22 NOTE — Progress Notes (Signed)
Subjective:    Patient ID: Sabrina Smith, female    DOB: February 21, 1981, 39 y.o.   MRN: [id number]  HPI 39 yo pt of Dr Darnell Level presents with R ear pain   It started about a month ago  Was tx at [facility] with amox for 10 days and she finished it   Some improvement but can still "feel it"  Randomly ear aches- improved Now it feels like her ear is full   No trouble hearing  No ringing or roaring sound   Has noticed popping a few times  No pain or redness of outer ear   No ST or fever    Sometimes she is congested (on and off) from allergies   Still uses generic flonase (xhance) once daily    Patient Active Problem List   Diagnosis Date Noted  . Stress 03/25/2019  . Eustachian tube dysfunction 10/06/2018  . Medical certificate issuance 06/16/2018  . Adverse food reaction 05/08/2018  . Abdominal discomfort, epigastric 02/12/2018  . Coccydynia 01/15/2018  . Perennial allergic rhinitis with seasonal variation 12/11/2017  . Hyperhidrosis 04/19/2017  . Family history of thyroid disease 04/05/2017  . Essential hypertension 11/01/2016  . Acute nonintractable headache 11/01/2016  . GERD (gastroesophageal reflux disease) 09/21/2015  . Allergic rhinitis 10/28/2014  . Mild intermittent asthma without complication 0000000  . Multiple food allergies 10/28/2014  . Pollen-food allergy 10/28/2014  . Fatigue 12/08/2012  . Acute maxillary sinusitis 01/30/2012  . Health care maintenance 04/11/2011   Past Medical History:  Diagnosis Date  . Allergic rhinitis    severe ([place])  . Asthma    as a child  . History of chicken pox   . Shingles 1990s   Past Surgical History:  Procedure Laterality Date  . COLONOSCOPY  02/2018   hemorrhoids, WNL (Outlaw)   Social History   Tobacco Use  . Smoking status: Former Smoker    Quit date: 09/27/2010    Years since quitting: 8.5  . Smokeless tobacco: Never Used  Substance Use Topics  . Alcohol use: Yes    Comment: Occasional  . Drug use: No     Family History  Problem Relation Age of Onset  . Diabetes Maternal Aunt   . Hypertension Maternal Aunt   . Diabetes Maternal Uncle   . Hypertension Maternal Uncle   . Stroke Maternal Uncle   . Heart disease Maternal Grandmother   . Diabetes Maternal Grandfather   . Cancer Maternal Grandfather        prostate  . Hypertension Mother   . Hypertension Brother   . Asthma Daughter   . Eczema Daughter   . Allergic rhinitis Neg Hx   . Urticaria Neg Hx    Allergies  Allergen Reactions  . Other     Soybean, Almond, Hazelnut  . Sulfa Drugs Cross Reactors Hives   Current Outpatient Medications on File Prior to Visit  Medication Sig Dispense Refill  . albuterol (PROAIR HFA) 108 (90 Base) MCG/ACT inhaler Inhale 2 puffs into the lungs every 4 (four) hours as needed. 1 Inhaler 1  . amLODipine (NORVASC) 5 MG tablet Take 1 tablet (5 mg total) by mouth daily. 90 tablet 3  . EPINEPHrine (AUVI-Q) 0.3 mg/0.3 mL IJ SOAJ injection Use as directed for severe allergic reaction 2 Device 1  . omeprazole (PRILOSEC) 40 MG capsule Take 1 capsule (40 mg total) by mouth daily as needed (heartburn). 30 capsule 3  . XHANCE 93 MCG/ACT [place] 2 DOSES IN [facility]  NOSTRIL TWICE DAILY 32 mL 4   No current facility-administered medications on file prior to visit.    Review of Systems  Constitutional: Negative for activity change, appetite change, fatigue, fever and unexpected weight change.  HENT: Positive for congestion and ear pain. Negative for ear discharge, facial swelling, hearing loss, postnasal drip, rhinorrhea, sinus pressure, sore throat and trouble swallowing.   Eyes: Negative for pain, redness and visual disturbance.  Respiratory: Negative for cough, shortness of breath and wheezing.   Cardiovascular: Negative for chest pain and palpitations.  Gastrointestinal: Negative for abdominal pain, blood in stool, constipation and diarrhea.  Endocrine: Negative for polydipsia and polyuria.  Genitourinary:  Negative for dysuria, frequency and urgency.  Musculoskeletal: Negative for arthralgias, back pain and myalgias.  Skin: Negative for pallor and rash.  Allergic/Immunologic: Negative for environmental allergies.  Neurological: Negative for dizziness, syncope and headaches.  Hematological: Negative for adenopathy. Does not bruise/bleed easily.  Psychiatric/Behavioral: Negative for decreased concentration and dysphoric mood. The patient is not nervous/anxious.        Objective:   Physical Exam Constitutional:      General: She is not in acute distress.    Appearance: Normal appearance. She is obese. She is not ill-appearing.  HENT:     Head: Normocephalic and atraumatic.     Comments: No sinus tenderness    Right Ear: Ear canal and external ear normal. There is no impacted cerumen.     Left Ear: Tympanic membrane and ear canal normal. There is no impacted cerumen.     Ears:     Comments: R TM is slightly dull w/o obvious effusion  No erythema or bulging     Nose: No congestion or rhinorrhea.     Mouth/Throat:     Mouth: Mucous membranes are moist.     Pharynx: Oropharynx is clear.     Comments: Scant clear pnd Eyes:     General:        Right eye: No discharge.        Left eye: No discharge.     Conjunctiva/sclera: Conjunctivae normal.     Pupils: Pupils are equal, round, and reactive to light.  Cardiovascular:     Rate and Rhythm: Normal rate and regular rhythm.  Pulmonary:     Effort: Pulmonary effort is normal. No respiratory distress.     Breath sounds: Normal breath sounds. No wheezing or rales.  Musculoskeletal:     Cervical back: Normal range of motion and neck supple. No rigidity.  Lymphadenopathy:     Cervical: No cervical adenopathy.  Skin:    Findings: No erythema or rash.  Neurological:     Mental Status: She is alert.     Cranial Nerves: No cranial nerve deficit.  Psychiatric:        Mood and Affect: Mood normal.           Assessment & Plan:    Problem List Items Addressed This Visit      Nervous and Auditory   Eustachian tube dysfunction - Primary    R ear with symptoms of pressure/popping s/p tx for OM with amox from [facility] over 10 d ago  H/o allergies/congestion  Adv to inc her fluticasone ns dosing to bid for a week then return to daily  If no improvement -would consider prednisone inst to watch for new symptoms (headache/facial pain or hearing loss)  Update if not starting to improve in a week or if worsening

## 2019-04-27 ENCOUNTER — Ambulatory Visit (INDEPENDENT_AMBULATORY_CARE_PROVIDER_SITE_OTHER): Payer: Federal, State, Local not specified - PPO | Admitting: Psychology

## 2019-04-27 DIAGNOSIS — F4323 Adjustment disorder with mixed anxiety and depressed mood: Secondary | ICD-10-CM | POA: Diagnosis not present

## 2019-04-30 DIAGNOSIS — L74519 Primary focal hyperhidrosis, unspecified: Secondary | ICD-10-CM | POA: Diagnosis not present

## 2019-05-01 ENCOUNTER — Ambulatory Visit: Payer: Federal, State, Local not specified - PPO | Admitting: Obstetrics & Gynecology

## 2019-05-08 ENCOUNTER — Ambulatory Visit: Payer: Federal, State, Local not specified - PPO | Attending: Internal Medicine

## 2019-05-08 DIAGNOSIS — Z23 Encounter for immunization: Secondary | ICD-10-CM

## 2019-05-08 NOTE — Progress Notes (Signed)
   Covid-19 Vaccination Clinic  Name:  Sabrina Smith    MRN: [id number] DOB: 30-Dec-1980  05/08/2019  [name] was observed post Covid-19 immunization for 15 minutes without incident. She was provided with Vaccine Information Sheet and instruction to access the V-Safe system.   [name] was instructed to call 911 with any severe reactions post vaccine: Marland Kitchen Difficulty breathing  . Swelling of face and throat  . A fast heartbeat  . A bad rash all over body  . Dizziness and weakness   Immunizations Administered    Name Date Dose VIS Date Route   Pfizer COVID-19 Vaccine 05/08/2019 10:49 AM 0.3 mL 02/06/2019 Intramuscular   Manufacturer: [place]   Lot: [id number]   [place]: [id number]

## 2019-05-14 ENCOUNTER — Ambulatory Visit: Payer: Federal, State, Local not specified - PPO | Admitting: Psychology

## 2019-05-27 ENCOUNTER — Ambulatory Visit: Payer: Federal, State, Local not specified - PPO | Admitting: Obstetrics & Gynecology

## 2019-06-02 ENCOUNTER — Ambulatory Visit: Payer: Federal, State, Local not specified - PPO | Attending: Internal Medicine

## 2019-06-02 DIAGNOSIS — Z23 Encounter for immunization: Secondary | ICD-10-CM

## 2019-06-02 NOTE — Progress Notes (Signed)
   Covid-19 Vaccination Clinic  Name:  Sabrina Smith    MRN: [id number] DOB: Jan 26, 1981  06/02/2019  [name] was observed post Covid-19 immunization for 15 minutes without incident. She was provided with Vaccine Information Sheet and instruction to access the V-Safe system.   [name] was instructed to call 911 with any severe reactions post vaccine: Marland Kitchen Difficulty breathing  . Swelling of face and throat  . A fast heartbeat  . A bad rash all over body  . Dizziness and weakness   Immunizations Administered    Name Date Dose VIS Date Route   Pfizer COVID-19 Vaccine 06/02/2019 10:04 AM 0.3 mL 02/06/2019 Intramuscular   Manufacturer: [place]   Lot: [id number]   [place]: [id number]

## 2019-06-19 ENCOUNTER — Encounter: Payer: Self-pay | Admitting: Obstetrics & Gynecology

## 2019-06-19 ENCOUNTER — Ambulatory Visit (INDEPENDENT_AMBULATORY_CARE_PROVIDER_SITE_OTHER): Payer: Federal, State, Local not specified - PPO | Admitting: Obstetrics & Gynecology

## 2019-06-19 ENCOUNTER — Other Ambulatory Visit: Payer: Self-pay

## 2019-06-19 ENCOUNTER — Other Ambulatory Visit (HOSPITAL_COMMUNITY)
Admission: RE | Admit: 2019-06-19 | Discharge: 2019-06-19 | Disposition: A | Discharge status: Home / Self Care | Payer: Federal, State, Local not specified - PPO | Source: Ambulatory Visit | Attending: Obstetrics & Gynecology | Admitting: Obstetrics & Gynecology

## 2019-06-19 VITALS — BP 100/70 | Ht 62.0 in | Wt 187.0 lb

## 2019-06-19 DIAGNOSIS — Z01419 Encounter for gynecological examination (general) (routine) without abnormal findings: Secondary | ICD-10-CM

## 2019-06-19 DIAGNOSIS — R5382 Chronic fatigue, unspecified: Secondary | ICD-10-CM | POA: Diagnosis not present

## 2019-06-19 DIAGNOSIS — R233 Spontaneous ecchymoses: Secondary | ICD-10-CM

## 2019-06-19 DIAGNOSIS — Z124 Encounter for screening for malignant neoplasm of cervix: Secondary | ICD-10-CM | POA: Diagnosis not present

## 2019-06-19 DIAGNOSIS — R238 Other skin changes: Secondary | ICD-10-CM | POA: Diagnosis not present

## 2019-06-19 NOTE — Patient Instructions (Signed)
Health Maintenance, Female Adopting a healthy lifestyle and getting preventive care are important in promoting health and wellness. Ask your health care provider about:  The right schedule for you to have regular tests and exams.  Things you can do on your own to prevent diseases and keep yourself healthy. What should I know about diet, weight, and exercise? Eat a healthy diet   Eat a diet that includes plenty of vegetables, fruits, low-fat dairy products, and lean protein.  Do not eat a lot of foods that are high in solid fats, added sugars, or sodium. Maintain a healthy weight Body mass index (BMI) is used to identify weight problems. It estimates body fat based on height and weight. Your health care provider can help determine your BMI and help you achieve or maintain a healthy weight. Get regular exercise Get regular exercise. This is one of the most important things you can do for your health. Most adults should:  Exercise for at least 150 minutes each week. The exercise should increase your heart rate and make you sweat (moderate-intensity exercise).  Do strengthening exercises at least twice a week. This is in addition to the moderate-intensity exercise.  Spend less time sitting. Even light physical activity can be beneficial. Watch cholesterol and blood lipids Have your blood tested for lipids and cholesterol at 38 years of age, then have this test every 5 years. Have your cholesterol levels checked more often if:  Your lipid or cholesterol levels are high.  You are older than 38 years of age.  You are at high risk for heart disease. What should I know about cancer screening? Depending on your health history and family history, you may need to have cancer screening at various ages. This may include screening for:  Breast cancer.  Cervical cancer.  Colorectal cancer.  Skin cancer.  Lung cancer. What should I know about heart disease, diabetes, and high blood  pressure? Blood pressure and heart disease  High blood pressure causes heart disease and increases the risk of stroke. This is more likely to develop in people who have high blood pressure readings, are of African descent, or are overweight.  Have your blood pressure checked: ? Every 3-5 years if you are 18-39 years of age. ? Every year if you are 40 years old or older. Diabetes Have regular diabetes screenings. This checks your fasting blood sugar level. Have the screening done:  Once every three years after age 40 if you are at a normal weight and have a low risk for diabetes.  More often and at a younger age if you are overweight or have a high risk for diabetes. What should I know about preventing infection? Hepatitis B If you have a higher risk for hepatitis B, you should be screened for this virus. Talk with your health care provider to find out if you are at risk for hepatitis B infection. Hepatitis C Testing is recommended for:  Everyone born from 1945 through 1965.  Anyone with known risk factors for hepatitis C. Sexually transmitted infections (STIs)  Get screened for STIs, including gonorrhea and chlamydia, if: ? You are sexually active and are younger than 38 years of age. ? You are older than 38 years of age and your health care provider tells you that you are at risk for this type of infection. ? Your sexual activity has changed since you were last screened, and you are at increased risk for chlamydia or gonorrhea. Ask your health care provider if   you are at risk.  Ask your health care provider about whether you are at high risk for HIV. Your health care provider may recommend a prescription medicine to help prevent HIV infection. If you choose to take medicine to prevent HIV, you should first get tested for HIV. You should then be tested every 3 months for as long as you are taking the medicine. Pregnancy  If you are about to stop having your period (premenopausal) and  you may become pregnant, seek counseling before you get pregnant.  Take 400 to 800 micrograms (mcg) of folic acid every day if you become pregnant.  Ask for birth control (contraception) if you want to prevent pregnancy. Osteoporosis and menopause Osteoporosis is a disease in which the bones lose minerals and strength with aging. This can result in bone fractures. If you are 65 years old or older, or if you are at risk for osteoporosis and fractures, ask your health care provider if you should:  Be screened for bone loss.  Take a calcium or vitamin D supplement to lower your risk of fractures.  Be given hormone replacement therapy (HRT) to treat symptoms of menopause. Follow these instructions at home: Lifestyle  Do not use any products that contain nicotine or tobacco, such as cigarettes, e-cigarettes, and chewing tobacco. If you need help quitting, ask your health care provider.  Do not use street drugs.  Do not share needles.  Ask your health care provider for help if you need support or information about quitting drugs. Alcohol use  Do not drink alcohol if: ? Your health care provider tells you not to drink. ? You are pregnant, may be pregnant, or are planning to become pregnant.  If you drink alcohol: ? Limit how much you use to 0-1 drink a day. ? Limit intake if you are breastfeeding.  Be aware of how much alcohol is in your drink. In the [place] one drink equals one 12 oz bottle of beer (355 mL), one 5 oz glass of wine (148 mL), or one 1 oz glass of hard liquor (44 mL). General instructions  Schedule regular health, dental, and eye exams.  Stay current with your vaccines.  Tell your health care provider if: ? You often feel depressed. ? You have ever been abused or do not feel safe at home. Summary  Adopting a healthy lifestyle and getting preventive care are important in promoting health and wellness.  Follow your health care provider's instructions about healthy  diet, exercising, and getting tested or screened for diseases.  Follow your health care provider's instructions on monitoring your cholesterol and blood pressure. This information is not intended to replace advice given to you by your health care provider. Make sure you discuss any questions you have with your health care provider. Document Revised: 02/05/2018 Document Reviewed: 02/05/2018 Elsevier Patient Education  2020 [employer]  

## 2019-06-19 NOTE — Progress Notes (Signed)
HPI:      [name] is a 39 y.o. G2P1011 who LMP was Patient's last menstrual period was 05/23/2019., she presents today for her annual examination. The patient has no complaints today other than easy bruising, feels cold all the time, and fatigue.  Reg cycles, last 7 days, mild-mod flow. No pain.  The patient is sexually active. Her last pap: approximate date 2018 and was normal. The patient does perform self breast exams.  There is no notable family history of breast or ovarian cancer in her family.  The patient has regular exercise: yes.  The patient denies current symptoms of depression.    GYN History: Contraception: condoms  PMHx: Past Medical History:  Diagnosis Date   Allergic rhinitis    severe ([place])   Asthma    as a child   History of chicken pox    Shingles 1990s   Past Surgical History:  Procedure Laterality Date   COLONOSCOPY  02/2018   hemorrhoids, WNL ([place])   Family History  Problem Relation Age of Onset   Diabetes Maternal Aunt    Hypertension Maternal Aunt    Diabetes Maternal Uncle    Hypertension Maternal Uncle    Stroke Maternal Uncle    Heart disease Maternal Grandmother    Diabetes Maternal Grandfather    Cancer Maternal Grandfather        prostate   Hypertension Mother    Hypertension Brother    Asthma Daughter    Eczema Daughter    Allergic rhinitis Neg Hx    Urticaria Neg Hx    Social History   Tobacco Use   Smoking status: Former Smoker    Quit date: 09/27/2010    Years since quitting: 8.7   Smokeless tobacco: Never Used  Substance Use Topics   Alcohol use: Yes    Comment: Occasional   Drug use: No    Current Outpatient Medications:    albuterol (PROAIR HFA) 108 (90 Base) MCG/ACT inhaler, Inhale 2 puffs into the lungs every 4 (four) hours as needed., Disp: 1 Inhaler, Rfl: 1   omeprazole (PRILOSEC) 40 MG capsule, Take 1 capsule (40 mg total) by mouth daily as needed (heartburn)., Disp: 30  capsule, Rfl: 3   XHANCE 93 MCG/ACT EXHU, BLOW 2 DOSES IN EACH NOSTRIL TWICE DAILY, Disp: 32 mL, Rfl: 4   amLODipine (NORVASC) 5 MG tablet, Take 1 tablet (5 mg total) by mouth daily. (Patient not taking: Reported on 06/19/2019), Disp: 90 tablet, Rfl: 3   EPINEPHrine (AUVI-Q) 0.3 mg/0.3 mL IJ SOAJ injection, Use as directed for severe allergic reaction, Disp: 2 Device, Rfl: 1 Allergies: Other and Sulfa drugs cross reactors  Review of Systems  Constitutional: Positive for malaise/fatigue. Negative for chills and fever.  HENT: Negative for congestion, sinus pain and sore throat.   Eyes: Negative for blurred vision and pain.  Respiratory: Negative for cough and wheezing.   Cardiovascular: Negative for chest pain and leg swelling.  Gastrointestinal: Negative for abdominal pain, constipation, diarrhea, heartburn, nausea and vomiting.  Genitourinary: Negative for dysuria, frequency, hematuria and urgency.  Musculoskeletal: Negative for back pain, joint pain, myalgias and neck pain.  Skin: Negative for itching and rash.  Neurological: Negative for dizziness, tremors and weakness.  Endo/Heme/Allergies: Bruises/bleeds easily.  Psychiatric/Behavioral: Negative for depression. The patient is not nervous/anxious and does not have insomnia.     Objective: BP 100/70    Ht 5\' 2"  (1.575 m)    Wt 187 lb (84.8 kg)  LMP 05/23/2019    BMI 34.20 kg/m   Filed Weights   06/19/19 1454  Weight: 187 lb (84.8 kg)   Body mass index is 34.2 kg/m. Physical Exam Constitutional:      General: She is not in acute distress.    Appearance: She is well-developed.  Genitourinary:     Pelvic exam was performed with patient supine.     Vagina, uterus and rectum normal.     No lesions in the vagina.     No vaginal bleeding.     No cervical motion tenderness, friability, lesion or polyp.     Uterus is mobile.     Uterus is not enlarged.     No uterine mass detected.    Uterus is midaxial.     No right or left  adnexal mass present.     Right adnexa not tender.     Left adnexa not tender.  HENT:     Head: Normocephalic and atraumatic. No laceration.     Right Ear: Hearing normal.     Left Ear: Hearing normal.     Mouth/Throat:     Pharynx: Uvula midline.  Eyes:     Pupils: Pupils are equal, round, and reactive to light.  Neck:     Thyroid: No thyromegaly.  Cardiovascular:     Rate and Rhythm: Normal rate and regular rhythm.     Heart sounds: No murmur. No friction rub. No gallop.   Pulmonary:     Effort: Pulmonary effort is normal. No respiratory distress.     Breath sounds: Normal breath sounds. No wheezing.  Chest:     Breasts:        Right: No mass, skin change or tenderness.        Left: No mass, skin change or tenderness.  Abdominal:     General: Bowel sounds are normal. There is no distension.     Palpations: Abdomen is soft.     Tenderness: There is no abdominal tenderness. There is no rebound.  Musculoskeletal:        General: Normal range of motion.     Cervical back: Normal range of motion and neck supple.  Neurological:     Mental Status: She is alert and oriented to person, place, and time.     Cranial Nerves: No cranial nerve deficit.  Skin:    General: Skin is warm and dry.  Psychiatric:        Judgment: Judgment normal.  Vitals reviewed.     Assessment:  ANNUAL EXAM 1. Women's annual routine gynecological examination   2. Easy bruising   3. Chronic fatigue   4. Screening for cervical cancer      Screening Plan:            1.  Cervical Screening-  Pap smear done today  2. Breast screening- Exam annually and mammogram>40 planned   3. Labs Ordered today  4. Counseling for contraception: condoms  No desire for change   5. Easy bruising and Fatigue - CBC - TSH     F/U  Return in about 1 year (around 06/18/2020) for Annual.  Barnett Applebaum, MD, [facility], [facility] 06/19/2019  3:29 PM

## 2019-06-20 LAB — CBC
Hematocrit: 43.9 % (ref 34.0–46.6)
Hemoglobin: 14.7 g/dL (ref 11.1–15.9)
MCH: 28.9 pg (ref 26.6–33.0)
MCHC: 33.5 g/dL (ref 31.5–35.7)
MCV: 86 fL (ref 79–97)
Platelets: 286 10*3/uL (ref 150–450)
RBC: 5.09 x10E6/uL (ref 3.77–5.28)
RDW: 13.1 % (ref 11.7–15.4)
WBC: 9.9 10*3/uL (ref 3.4–10.8)

## 2019-06-20 LAB — TSH: TSH: 2.08 u[IU]/mL (ref 0.450–4.500)

## 2019-06-22 DIAGNOSIS — J3501 Chronic tonsillitis: Secondary | ICD-10-CM | POA: Diagnosis not present

## 2019-06-23 LAB — CYTOLOGY - PAP
Comment: NEGATIVE
Diagnosis: NEGATIVE
High risk HPV: NEGATIVE

## 2019-07-01 DIAGNOSIS — Z01812 Encounter for preprocedural laboratory examination: Secondary | ICD-10-CM | POA: Diagnosis not present

## 2019-07-01 DIAGNOSIS — Z20822 Contact with and (suspected) exposure to covid-19: Secondary | ICD-10-CM | POA: Diagnosis not present

## 2019-07-03 DIAGNOSIS — K219 Gastro-esophageal reflux disease without esophagitis: Secondary | ICD-10-CM | POA: Diagnosis not present

## 2019-07-03 DIAGNOSIS — J3501 Chronic tonsillitis: Secondary | ICD-10-CM | POA: Diagnosis not present

## 2019-07-03 DIAGNOSIS — J4520 Mild intermittent asthma, uncomplicated: Secondary | ICD-10-CM | POA: Diagnosis not present

## 2019-07-03 DIAGNOSIS — I10 Essential (primary) hypertension: Secondary | ICD-10-CM | POA: Diagnosis not present

## 2019-07-08 ENCOUNTER — Other Ambulatory Visit: Payer: Self-pay | Admitting: Allergy and Immunology

## 2019-08-18 DIAGNOSIS — R399 Unspecified symptoms and signs involving the genitourinary system: Secondary | ICD-10-CM | POA: Diagnosis not present

## 2019-08-18 DIAGNOSIS — R350 Frequency of micturition: Secondary | ICD-10-CM | POA: Diagnosis not present

## 2019-09-01 ENCOUNTER — Other Ambulatory Visit: Payer: Self-pay | Admitting: Family Medicine

## 2019-09-25 DIAGNOSIS — G44209 Tension-type headache, unspecified, not intractable: Secondary | ICD-10-CM | POA: Diagnosis not present

## 2019-09-25 DIAGNOSIS — Z20822 Contact with and (suspected) exposure to covid-19: Secondary | ICD-10-CM | POA: Diagnosis not present

## 2019-09-28 ENCOUNTER — Ambulatory Visit: Payer: Federal, State, Local not specified - PPO | Admitting: Family Medicine

## 2019-09-28 ENCOUNTER — Encounter: Payer: Self-pay | Admitting: Family Medicine

## 2019-09-28 ENCOUNTER — Other Ambulatory Visit: Payer: Self-pay

## 2019-09-28 VITALS — BP 120/80 | HR 58 | Temp 97.6°F | Ht 62.0 in | Wt 178.6 lb

## 2019-09-28 DIAGNOSIS — R002 Palpitations: Secondary | ICD-10-CM | POA: Insufficient documentation

## 2019-09-28 DIAGNOSIS — R519 Headache, unspecified: Secondary | ICD-10-CM | POA: Diagnosis not present

## 2019-09-28 DIAGNOSIS — I499 Cardiac arrhythmia, unspecified: Secondary | ICD-10-CM

## 2019-09-28 DIAGNOSIS — I10 Essential (primary) hypertension: Secondary | ICD-10-CM

## 2019-09-28 MED ORDER — OMEPRAZOLE 40 MG PO CPDR: 40.0000 mg | DELAYED_RELEASE_CAPSULE | Freq: Every day | ORAL | 3 refills | Status: DC | PRN

## 2019-09-28 NOTE — Assessment & Plan Note (Signed)
BP overall stable off antihypertensive. Will stay off at this time, continue to monitor blood pressures at home.

## 2019-09-28 NOTE — Assessment & Plan Note (Addendum)
Benign neurological exam. Anticipate TTH - rec flexeril PRN with sedation precautions. excedrin has been helpful as well.

## 2019-09-28 NOTE — Progress Notes (Signed)
This visit was conducted in person.  BP 120/80 (BP Location: Left Arm, Patient Position: Sitting, Cuff Size: Normal)   Pulse 58   Temp 97.6 F (36.4 C) (Temporal)   Ht 5\' 2"  (1.575 m)   Wt 178 lb 9 oz (81 kg)   LMP 09/22/2019   SpO2 98%   BMI 32.66 kg/m    CC: HTN f/u, HA Subjective:    Patient ID: Sabrina Smith, female    DOB: 01/21/1981, 39 y.o.   MRN: [id number]  HPI: Sabrina Smith is a 39 y.o. female presenting on 09/28/2019 for Hypertension (Here for f/u.) and Headache (C/o HA.  Started 09/24/19.  HA is continuous.  Tried Excedrin and Tylenol.  Excedrin helped. )   HTN - ran out of amlodipine 5mg  daily over aamonth ago.  Does check blood pressures at home: fluctuating 90/60 up to 135/94, unsure how accurate BP readings have been. No low blood pressure readings or symptoms of dizziness/syncope. Denies vision changes, CP/tightness, leg swelling. HA present - see below. Some dyspnea. Notes increasing palpitations most noticeable at night time when trying to rest - present for over a year.  Increasing headaches noted recently. 4-5 d h/o HA points to anterior head into vertex, occasionally L temple. Yesterday and today much better. Also endorses tension in neck. Seen Friday at [facility] - BP 118/80. No associated vision changes. Some nausea with photo/phonophobia on Saturday. No h/o migraines. On Friday it was activity limiting. Tylenol didn't help. Tried excedrin with benefit. No known snoring, no PNdyspnea, does feel rested when she awakens, not too much daytime somnolence.   LMP - currently on period.     Relevant past medical, surgical, family and social history reviewed and updated as indicated. Interim medical history since our last visit reviewed. Allergies and medications reviewed and updated. Outpatient Medications Prior to Visit  Medication Sig Dispense Refill  . albuterol (PROAIR HFA) 108 (90 Base) MCG/ACT inhaler Inhale 2 puffs into the lungs every 4 (four) hours as  needed. 1 Inhaler 1  . amLODipine (NORVASC) 5 MG tablet Take 1 tablet (5 mg total) by mouth daily. 90 tablet 3  . EPINEPHrine (AUVI-Q) 0.3 mg/0.3 mL IJ SOAJ injection Use as directed for severe allergic reaction 2 Device 1  . XHANCE 93 MCG/ACT EXHU BLOW 2 DOSES IN EACH NOSTRIL TWICE DAILY 16 mL 0  . omeprazole (PRILOSEC) 40 MG capsule Take 1 capsule (40 mg total) by mouth daily as needed (heartburn). 30 capsule 3   No facility-administered medications prior to visit.     Per HPI unless specifically indicated in ROS section below Review of Systems Objective:  BP 120/80 (BP Location: Left Arm, Patient Position: Sitting, Cuff Size: Normal)   Pulse 58   Temp 97.6 F (36.4 C) (Temporal)   Ht 5\' 2"  (1.575 m)   Wt 178 lb 9 oz (81 kg)   LMP 09/22/2019   SpO2 98%   BMI 32.66 kg/m   Wt Readings from Last 3 Encounters:  09/28/19 178 lb 9 oz (81 kg)  06/19/19 187 lb (84.8 kg)  04/22/19 192 lb (87.1 kg)      Physical Exam Vitals and nursing note reviewed.  Constitutional:      Appearance: Normal appearance. She is not ill-appearing.  Neck:     Thyroid: No thyroid mass, thyromegaly or thyroid tenderness.  Cardiovascular:     Rate and Rhythm: Normal rate. Rhythm irregular.     Pulses: Normal pulses.     Heart  sounds: Normal heart sounds. No murmur heard.   Pulmonary:     Effort: Pulmonary effort is normal. No respiratory distress.     Breath sounds: Normal breath sounds. No wheezing, rhonchi or rales.  Musculoskeletal:     Right lower leg: No edema.     Left lower leg: No edema.  Skin:    General: Skin is warm and dry.  Neurological:     Mental Status: She is alert.     Cranial Nerves: Cranial nerves are intact.     Sensory: Sensation is intact.     Motor: Motor function is intact.     Coordination: Coordination is intact.     Comments:  CN 2-12 intact FTN intact EOMI  Psychiatric:        Mood and Affect: Mood normal.        Behavior: Behavior normal.       Results for  orders placed or performed in visit on 06/19/19  CBC  Result Value Ref Range   WBC 9.9 3.4 - 10.8 x10E3/uL   RBC 5.09 3.77 - 5.28 x10E6/uL   Hemoglobin 14.7 11.1 - 15.9 g/dL   Hematocrit 43.9 34.0 - 46.6 %   MCV 86 79 - 97 fL   MCH 28.9 26.6 - 33.0 pg   MCHC 33.5 31 - 35 g/dL   RDW 13.1 11.7 - 15.4 %   Platelets 286 150 - 450 x10E3/uL  TSH  Result Value Ref Range   TSH 2.080 0.450 - 4.500 uIU/mL  Cytology - PAP  Result Value Ref Range   High risk HPV Negative    Adequacy      Satisfactory for evaluation; transformation zone component PRESENT.   Diagnosis      - Negative for intraepithelial lesion or malignancy (NILM)   Comment Normal Reference Range HPV - Negative    Lab Results  Component Value Date   CREATININE 0.72 02/13/2018   BUN 10 02/13/2018   NA 139 02/13/2018   K 3.4 (L) 02/13/2018   CL 105 02/13/2018   CO2 26 02/13/2018    EKG - sinus bradycardia 50s, normal axis, no acute ST/T changes, multiple PACs  Assessment & Plan:  This visit occurred during the SARS-CoV-2 public health emergency.  Safety protocols were in place, including screening questions prior to the visit, additional usage of staff PPE, and extensive cleaning of exam room while observing appropriate contact time as indicated for disinfecting solutions.   Problem List Items Addressed This Visit    Essential hypertension - Primary    BP overall stable off antihypertensive. Will stay off at this time, continue to monitor blood pressures at home.       Relevant Orders   EKG 12-Lead (Completed)   Arrhythmia    Patient endorses about a year of noticeable palpitations. EKG showing multiple PACs in setting of bradycardia will refer to cardiology for further evaluation. Pt agrees with plan.       Relevant Orders   Ambulatory referral to Cardiology   Acute nonintractable headache    Benign neurological exam. Anticipate TTH - rec flexeril PRN with sedation precautions. excedrin has been helpful as well.             Meds ordered this encounter  Medications  . omeprazole (PRILOSEC) 40 MG capsule    Sig: Take 1 capsule (40 mg total) by mouth daily as needed (heartburn).    Dispense:  30 capsule    Refill:  3   Orders Placed  This Encounter  Procedures  . Ambulatory referral to Cardiology    Referral Priority:   Routine    Referral Type:   Consultation    Referral Reason:   Specialty Services Required    Requested Specialty:   Cardiology    Number of Visits Requested:   1  . EKG 12-Lead    Patient Instructions  EKG today - irregular. I'd like to refer you to the heart doctor for further evaluation.  Let's stay off amlodipine for now, continue to watch blood pressures and let us know if starting to trend >140/90 consistently.  May use flexeril as needed for headache. Omeprazole refilled today - continue as needed for heartburn.    Follow up plan: Return if symptoms worsen or fail to improve.  Ria Bush, MD

## 2019-09-28 NOTE — Patient Instructions (Addendum)
EKG today - irregular. I'd like to refer you to the heart doctor for further evaluation.  Let's stay off amlodipine for now, continue to watch blood pressures and let us know if starting to trend >140/90 consistently.  May use flexeril as needed for headache. Omeprazole refilled today - continue as needed for heartburn.

## 2019-09-28 NOTE — Assessment & Plan Note (Addendum)
Patient endorses about a year of noticeable palpitations. EKG showing multiple PACs in setting of bradycardia will refer to cardiology for further evaluation. Pt agrees with plan.

## 2019-09-29 NOTE — Progress Notes (Signed)
New Outpatient Visit Date: 09/30/2019  Referring Provider: Ria Bush, MD [address] [place],  [place] [address]  Chief Complaint: Palpitations  HPI:  Sabrina Smith is a 39 y.o. female who is being seen today for the evaluation of palpitations and PACs at the request of Dr. Danise Mina. She has a history of hypertension, asthma, and GERD.  She was evaluated by CPR as 2 days ago and complained of a 1 year history of palpitations.  EKG was interpreted as sinus bradycardia with frequent PACs.  Ms. Ezekiel reports that she has noticed skipped beats for at least a year.  She feels that it feels like her heart is "gasping for air" at times.  She is most aware of the palpitations when she is still.  She notes occasional tightness in her chest as well.  This is sometimes associated with eating and sometimes when she is sitting/lying still.  She does not have any exertional symptoms, including chest pain, shortness of breath, and palpitations.  He feels like the palpitations have increased in frequency over the last year.  She has never passed out.  Ms. Theissen denies having undergone heart testing in the past.  She consumes a cup of coffee 2-3 times per week.  She previously took medications to control her blood pressure but has been weaned off these.  --------------------------------------------------------------------------------------------------  Cardiovascular History & Procedures: Cardiovascular Problems:  Palpitations  Chest pain  Risk Factors:  Hypertension, obesity, and prior tobacco use  Cath/PCI:  None  CV Surgery:  None  EP Procedures and Devices:  None  Non-Invasive Evaluation(s):  None  Recent CV Pertinent Labs: Lab Results  Component Value Date   CHOL 146 12/15/2013   HDL 39.80 12/15/2013   LDLCALC 98 12/15/2013   TRIG 39.0 12/15/2013   CHOLHDL 4 12/15/2013   K 3.4 (L) 02/13/2018   BUN 10 02/13/2018   CREATININE 0.72 02/13/2018     --------------------------------------------------------------------------------------------------  Past Medical History:  Diagnosis Date   Allergic rhinitis    severe ([facility])   Asthma    as a child   History of chicken pox    Shingles 1990s    Past Surgical History:  Procedure Laterality Date   COLONOSCOPY  02/2018   hemorrhoids, WNL ([facility])    Current Meds  Medication Sig   albuterol (PROAIR HFA) 108 (90 Base) MCG/ACT inhaler Inhale 2 puffs into the lungs every 4 (four) hours as needed.   DRYSOL 20 % external solution Apply topically daily.   EPINEPHrine (AUVI-Q) 0.3 mg/0.3 mL IJ SOAJ injection Use as directed for severe allergic reaction   omeprazole (PRILOSEC) 40 MG capsule Take 1 capsule (40 mg total) by mouth daily as needed (heartburn).   Promethazine HCl 6.25 MG/5ML SOLN    XHANCE 93 MCG/ACT EXHU BLOW 2 DOSES IN EACH NOSTRIL TWICE DAILY    Allergies: Other and Sulfa drugs cross reactors  Social History   Tobacco Use   Smoking status: Former Smoker    Quit date: 09/27/2010    Years since quitting: 9.0   Smokeless tobacco: Never Used  Vaping Use   Vaping Use: Never used  Substance Use Topics   Alcohol use: Yes    Comment: Occasional   Drug use: No    Family History  Problem Relation Age of Onset   Diabetes Maternal Aunt    Hypertension Maternal Aunt    Diabetes Maternal Uncle    Hypertension Maternal Uncle    Stroke Maternal Uncle  Heart disease Maternal Grandmother    Diabetes Maternal Grandfather    Cancer Maternal Grandfather        prostate   Hypertension Mother    Hypertension Brother    Asthma Daughter    Eczema Daughter    Allergic rhinitis Neg Hx    Urticaria Neg Hx     Review of Systems: A 12-system review of systems was performed and was negative except as noted in the HPI.  --------------------------------------------------------------------------------------------------  Physical Exam: BP  120/90 (BP Location: Left Arm, Patient Position: Sitting, Cuff Size: Normal)    Pulse 64    Ht 5\' 2"  (1.575 m)    Wt 175 lb 12.8 oz (79.7 kg)    LMP 09/22/2019    SpO2 96%    BMI 32.15 kg/m   General:  NAD. HEENT: No conjunctival pallor or scleral icterus. Facemask in place. Neck: Supple without lymphadenopathy, thyromegaly, JVD, or HJR. No carotid bruit. Lungs: Normal work of breathing. Clear to auscultation bilaterally without wheezes or crackles. Heart: Regular rate and rhythm with occasional extrasystoles.  No murmurs, rubs, or gallops. Non-displaced PMI. Abd: Bowel sounds present. Soft, NT/ND without hepatosplenomegaly Ext: No lower extremity edema. Radial, PT, and DP pulses are 2+ bilaterally Skin: Warm and dry without rash. Neuro: CNIII-XII intact. Strength and fine-touch sensation intact in upper and lower extremities bilaterally. Psych: Normal mood and affect.  EKG:  NSR with frequent premature junctional beats, borderline LVH, and poor R wave progression.  No significant change from prior tracing on 09/28/2019.  Lab Results  Component Value Date   WBC 9.9 06/19/2019   HGB 14.7 06/19/2019   HCT 43.9 06/19/2019   MCV 86 06/19/2019   PLT 286 06/19/2019    Lab Results  Component Value Date   NA 139 02/13/2018   K 3.4 (L) 02/13/2018   CL 105 02/13/2018   CO2 26 02/13/2018   BUN 10 02/13/2018   CREATININE 0.72 02/13/2018   GLUCOSE 109 (H) 02/13/2018   ALT 18 02/13/2018    Lab Results  Component Value Date   CHOL 146 12/15/2013   HDL 39.80 12/15/2013   LDLCALC 98 12/15/2013   TRIG 39.0 12/15/2013   CHOLHDL 4 12/15/2013     --------------------------------------------------------------------------------------------------  ASSESSMENT AND PLAN: Palpitations with premature junctional beats: Palpitations have been present for >1 year but seem to be getting more frequent.  EKG's earlier this week and again today demonstrate sinus rhythm with premature junctional beats.   I have recommended that we check a CMP and magnesium level today.  We will also arrange for a transthoracic echocardiogram.  I have encouraged Ms. Mackowski to minimize her caffeine intake.  We discussed additional a beta-blocker, though given her history of multiple allergies for which she carries an EpiPen, I would favor avoidance of this class of agents.  If symptoms persist, low-dose diltiazem could be considered in the future.  Chest pain: Atypical.  EKG today shows premature junctional beats and longstanding poor R wave progression in V1-V2.  I have recommended that we obtain a transthoracic echocardiogram.  I have a low suspicion for obstructive CAD, given young age.  Hypertension: Blood pressure borderline today with DBP of 90 mmHg.  Continue to work on lifestyle modifications, including weight loss and sodium restriction.  Follow-up: Return to clinic in 6 months.  Nelva Bush, MD 09/30/2019 11:11 AM

## 2019-09-30 ENCOUNTER — Other Ambulatory Visit: Payer: Self-pay

## 2019-09-30 ENCOUNTER — Ambulatory Visit (INDEPENDENT_AMBULATORY_CARE_PROVIDER_SITE_OTHER): Payer: Federal, State, Local not specified - PPO | Admitting: Internal Medicine

## 2019-09-30 ENCOUNTER — Encounter: Payer: Self-pay | Admitting: Internal Medicine

## 2019-09-30 VITALS — BP 120/90 | HR 64 | Ht 62.0 in | Wt 175.8 lb

## 2019-09-30 DIAGNOSIS — I492 Junctional premature depolarization: Secondary | ICD-10-CM

## 2019-09-30 DIAGNOSIS — R002 Palpitations: Secondary | ICD-10-CM | POA: Diagnosis not present

## 2019-09-30 DIAGNOSIS — R079 Chest pain, unspecified: Secondary | ICD-10-CM | POA: Diagnosis not present

## 2019-09-30 NOTE — Patient Instructions (Signed)
Medication Instructions:  Your physician recommends that you continue on your current medications as directed. Please refer to the Current Medication list given to you today.  *If you need a refill on your cardiac medications before your next appointment, please call your pharmacy*  Lab Work: Your physician recommends that you return for lab work in: [place], [place].   If you have labs (blood work) drawn today and your tests are completely normal, you will receive your results only by:  [place] (if you have MyChart) OR  A paper copy in the mail If you have any lab test that is abnormal or we need to change your treatment, we will call you to review the results.  Testing/Procedures: Your physician has requested that you have an echocardiogram. Echocardiography is a painless test that uses sound waves to create images of your heart. It provides your doctor with information about the size and shape of your heart and how well your hearts chambers and valves are working. This procedure takes approximately one hour. There are no restrictions for this procedure. You may get an IV, if needed, to receive an ultrasound enhancing agent through to better visualize your heart.   Follow-Up: At [facility], you and your health needs are our priority.  As part of our continuing mission to provide you with exceptional heart care, we have created designated Provider Care Teams.  These Care Teams include your primary Cardiologist (physician) and Advanced Practice Providers (APPs -  Physician Assistants and Nurse Practitioners) who all work together to provide you with the care you need, when you need it.  We recommend signing up for the patient portal called "MyChart".  Sign up information is provided on this After Visit Summary.  MyChart is used to connect with patients for Virtual Visits (Telemedicine).  Patients are able to view lab/test results, encounter notes, upcoming appointments, etc.   Non-urgent messages can be sent to your provider as well.   To learn more about what you can do with MyChart, go to [email or web address].    Your next appointment:   6 week(s)  The format for your next appointment:   In Person  Provider:    You may see DR Harrell Gave END or one of the following Advanced Practice Providers on your designated Care Team:    Murray Hodgkins, NP  Christell Faith, PA-C  Marrianne Mood, PA-C  Other Instructions Dr End recommends you decrease your intake of caffeine.

## 2019-10-01 ENCOUNTER — Encounter: Payer: Self-pay | Admitting: Internal Medicine

## 2019-10-01 DIAGNOSIS — R002 Palpitations: Secondary | ICD-10-CM | POA: Insufficient documentation

## 2019-10-01 DIAGNOSIS — I492 Junctional premature depolarization: Secondary | ICD-10-CM | POA: Insufficient documentation

## 2019-10-01 DIAGNOSIS — R079 Chest pain, unspecified: Secondary | ICD-10-CM | POA: Insufficient documentation

## 2019-10-01 LAB — COMPREHENSIVE METABOLIC PANEL
ALT: 14 IU/L (ref 0–32)
AST: 15 IU/L (ref 0–40)
Albumin/Globulin Ratio: 1.8 (ref 1.2–2.2)
Albumin: 4.6 g/dL (ref 3.8–4.8)
Alkaline Phosphatase: 74 IU/L (ref 48–121)
BUN/Creatinine Ratio: 14 (ref 9–23)
BUN: 10 mg/dL (ref 6–20)
Bilirubin Total: 0.4 mg/dL (ref 0.0–1.2)
CO2: 24 mmol/L (ref 20–29)
Calcium: 9.6 mg/dL (ref 8.7–10.2)
Chloride: 104 mmol/L (ref 96–106)
Creatinine, Ser: 0.73 mg/dL (ref 0.57–1.00)
GFR calc Af Amer: 121 mL/min/{1.73_m2} (ref >59)
GFR calc non Af Amer: 105 mL/min/{1.73_m2} (ref >59)
Globulin, Total: 2.5 g/dL (ref 1.5–4.5)
Glucose: 96 mg/dL (ref 65–99)
Potassium: 4 mmol/L (ref 3.5–5.2)
Sodium: 142 mmol/L (ref 134–144)
Total Protein: 7.1 g/dL (ref 6.0–8.5)

## 2019-10-01 LAB — MAGNESIUM: Magnesium: 2.1 mg/dL (ref 1.6–2.3)

## 2019-10-02 ENCOUNTER — Telehealth: Payer: Self-pay

## 2019-10-02 NOTE — Telephone Encounter (Signed)
-----   Message from Nelva Bush, MD sent at 10/01/2019 12:57 PM EDT ----- Please let Ms. Godsil know that her kidney function, liver function, and electrolytes are normal.

## 2019-10-02 NOTE — Telephone Encounter (Signed)
Call to patient to review labs.    Pt verbalized understanding and has no further questions at this time.    Advised pt to call for any further questions or concerns.  No further orders.   

## 2019-10-27 ENCOUNTER — Other Ambulatory Visit: Payer: Self-pay

## 2019-10-27 ENCOUNTER — Ambulatory Visit (INDEPENDENT_AMBULATORY_CARE_PROVIDER_SITE_OTHER): Payer: Federal, State, Local not specified - PPO

## 2019-10-27 DIAGNOSIS — I492 Junctional premature depolarization: Secondary | ICD-10-CM | POA: Diagnosis not present

## 2019-10-27 DIAGNOSIS — M25551 Pain in right hip: Secondary | ICD-10-CM | POA: Diagnosis not present

## 2019-10-27 DIAGNOSIS — M545 Low back pain: Secondary | ICD-10-CM | POA: Diagnosis not present

## 2019-10-27 DIAGNOSIS — M79621 Pain in right upper arm: Secondary | ICD-10-CM | POA: Insufficient documentation

## 2019-10-27 DIAGNOSIS — R079 Chest pain, unspecified: Secondary | ICD-10-CM | POA: Diagnosis not present

## 2019-10-27 LAB — ECHOCARDIOGRAM COMPLETE
AR max vel: 2.86 cm2
AV Area VTI: 2.87 cm2
AV Area mean vel: 2.73 cm2
AV Mean grad: 3 mmHg
AV Peak grad: 5.2 mmHg
Ao pk vel: 1.14 m/s
Area-P 1/2: 5.54 cm2
Calc EF: 56.3 %
S' Lateral: 3.3 cm
Single Plane A2C EF: 57.7 %
Single Plane A4C EF: 53.9 %

## 2019-10-29 DIAGNOSIS — F4322 Adjustment disorder with anxiety: Secondary | ICD-10-CM | POA: Diagnosis not present

## 2019-10-30 DIAGNOSIS — M542 Cervicalgia: Secondary | ICD-10-CM | POA: Diagnosis not present

## 2019-10-30 DIAGNOSIS — M25551 Pain in right hip: Secondary | ICD-10-CM | POA: Diagnosis not present

## 2019-10-30 DIAGNOSIS — M533 Sacrococcygeal disorders, not elsewhere classified: Secondary | ICD-10-CM | POA: Diagnosis not present

## 2019-10-30 DIAGNOSIS — M791 Myalgia, unspecified site: Secondary | ICD-10-CM | POA: Diagnosis not present

## 2019-11-03 DIAGNOSIS — M542 Cervicalgia: Secondary | ICD-10-CM | POA: Diagnosis not present

## 2019-11-03 DIAGNOSIS — M533 Sacrococcygeal disorders, not elsewhere classified: Secondary | ICD-10-CM | POA: Diagnosis not present

## 2019-11-03 DIAGNOSIS — M791 Myalgia, unspecified site: Secondary | ICD-10-CM | POA: Diagnosis not present

## 2019-11-03 DIAGNOSIS — M25551 Pain in right hip: Secondary | ICD-10-CM | POA: Diagnosis not present

## 2019-11-03 IMAGING — US US THYROID
1 series · 14 of 25 positions shown · non-contrast
Comparison: None.

CLINICAL DATA: Right-sided nodule on physical exam

EXAM:
THYROID ULTRASOUND
TECHNIQUE: Ultrasound examination of the thyroid gland and adjacent soft
tissues was performed.

[Series 1: us thyroid · 0.05mm/px · 14 of 32 slices shown]
[im 1/32]
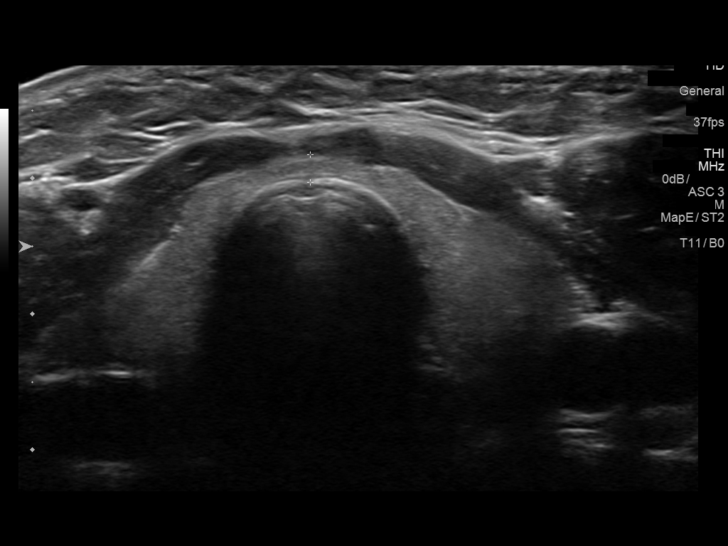
[im 3/32]
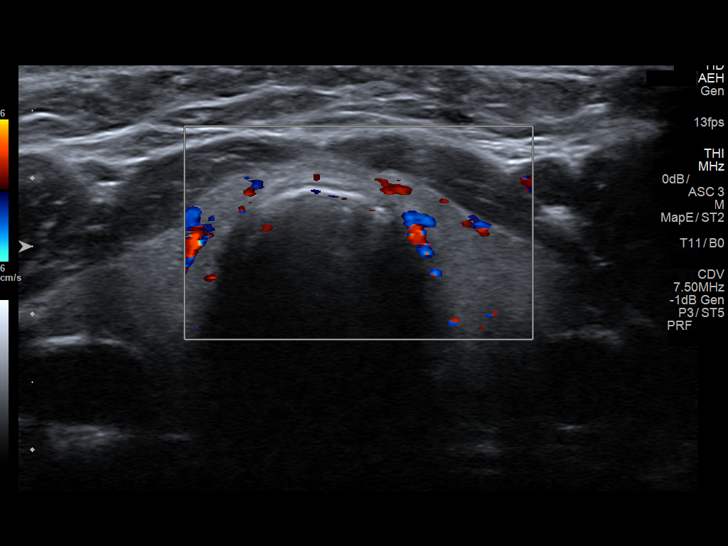
[im 6/32]
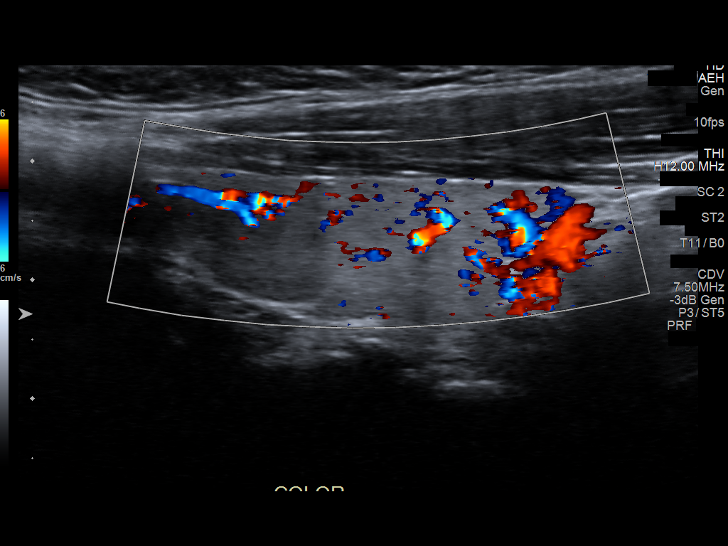
[im 8/32]
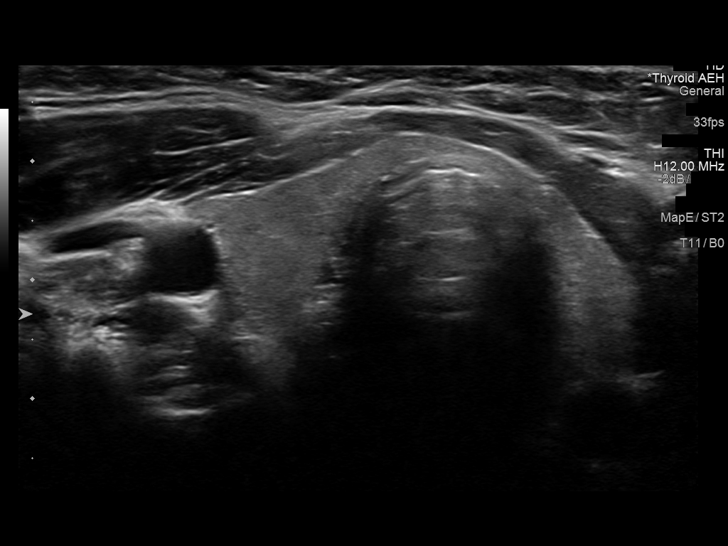
[im 11/32]
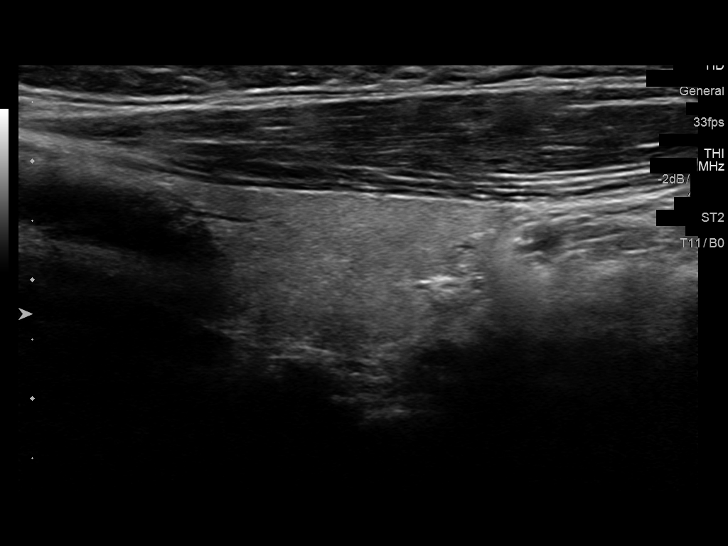
[im 12/32]
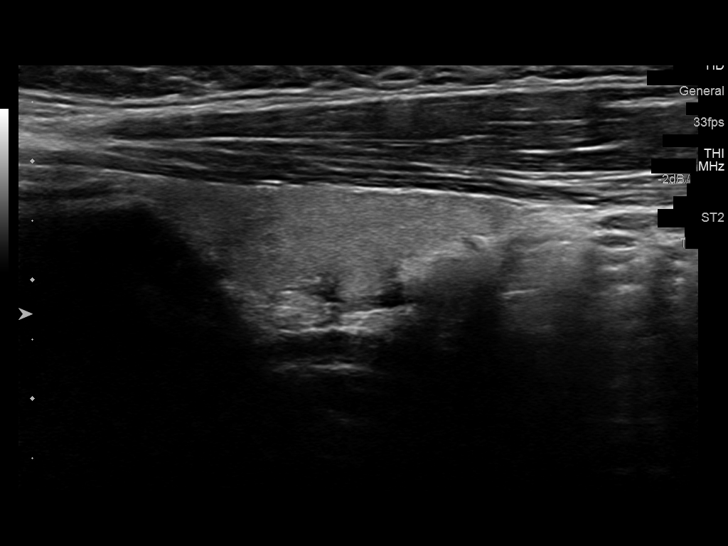
[im 15/32]
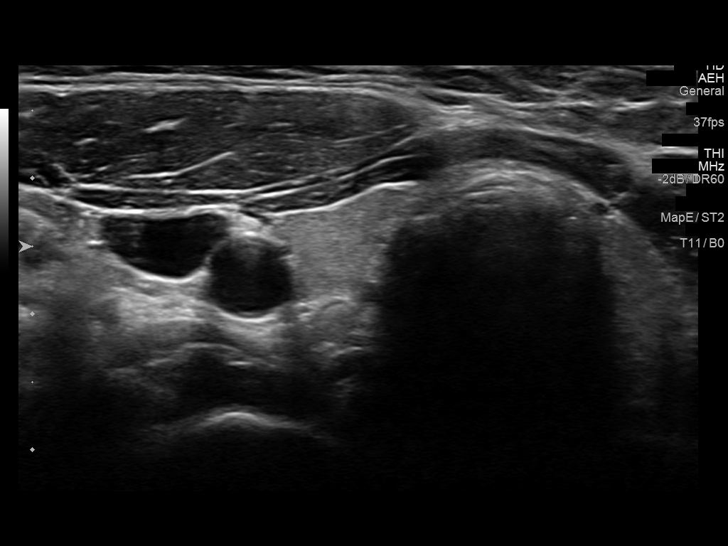
[im 17/32]
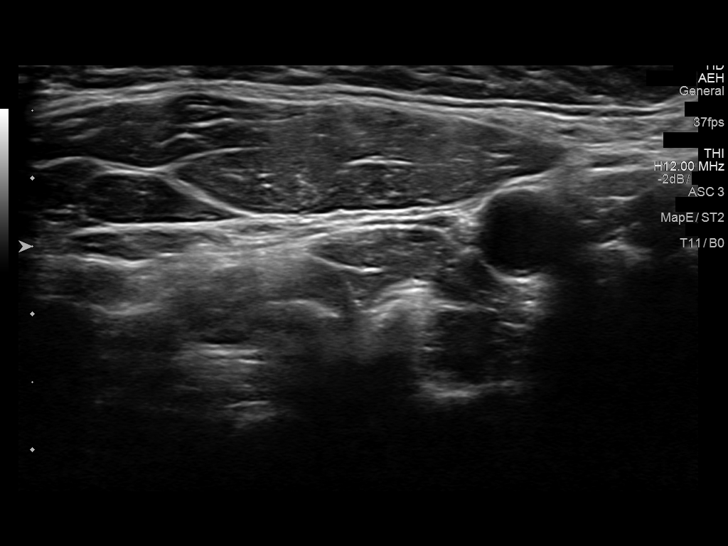
[im 20/32]
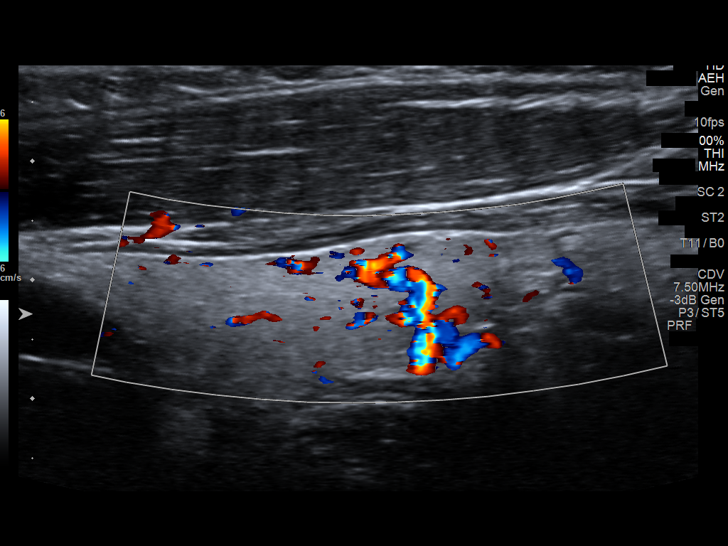
[im 21/32]
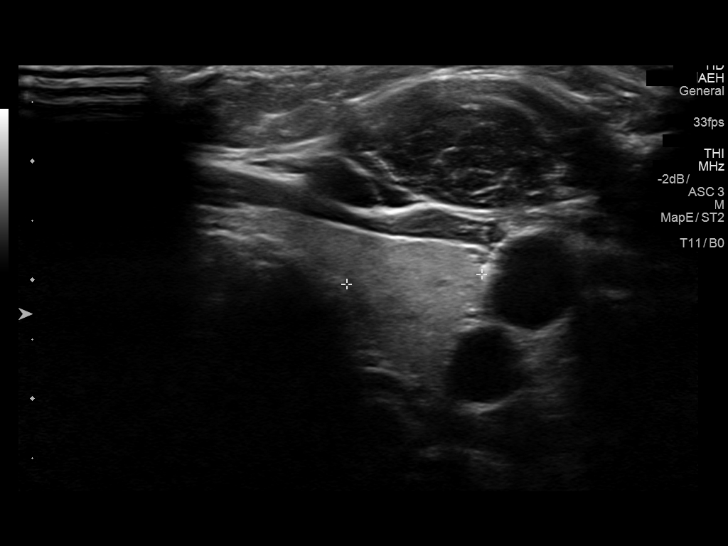
[im 24/32]
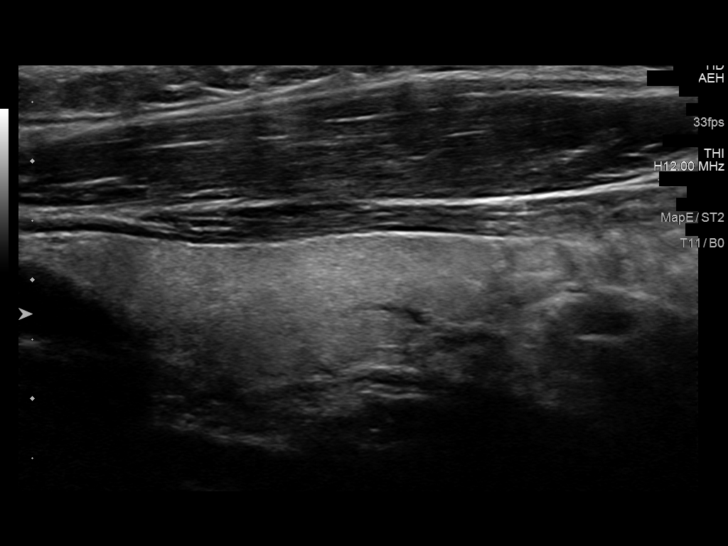
[im 26/32]
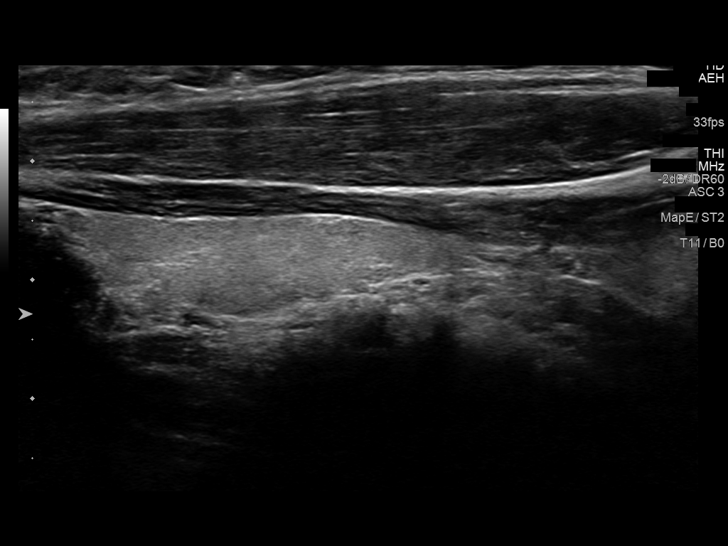
[im 29/32]
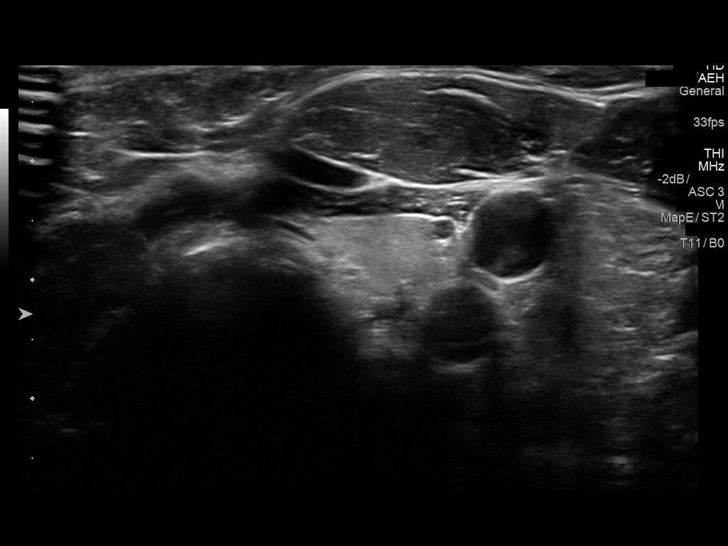
[im 32/32]
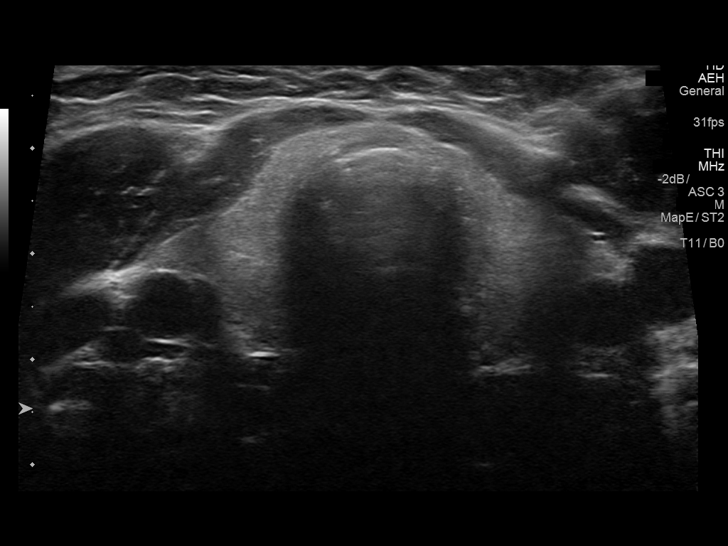

[14 of 25 positions shown; findings below may reference images not displayed]

FINDINGS: Parenchymal Echotexture: Normal

Isthmus: 0.2 cm thickness

Right lobe: 3.7 x 1 x 1.3 cm

Left lobe: 3.6 x 1 x 1.1 cm

_________________________________________________________

Estimated total number of nodules >/= 1 cm: 0

Number of spongiform nodules >/=  2 cm not described below (TR1): 0

Number of mixed cystic and solid nodules >/= 1.5 cm not described
below (TR2): 0

_________________________________________________________

No discrete nodules are seen within the thyroid gland.
IMPRESSION: Normal thyroid.

The above is in keeping with the ACR TI-RADS recommendations - [HOSPITAL] 3203;[DATE].

## 2019-11-10 ENCOUNTER — Ambulatory Visit: Payer: Federal, State, Local not specified - PPO | Admitting: Physician Assistant

## 2019-11-10 ENCOUNTER — Encounter: Payer: Self-pay | Admitting: Physician Assistant

## 2019-11-10 ENCOUNTER — Other Ambulatory Visit: Payer: Self-pay

## 2019-11-10 VITALS — BP 112/80 | HR 72 | Ht 62.0 in | Wt 179.5 lb

## 2019-11-10 DIAGNOSIS — K219 Gastro-esophageal reflux disease without esophagitis: Secondary | ICD-10-CM

## 2019-11-10 DIAGNOSIS — R400 Somnolence: Secondary | ICD-10-CM

## 2019-11-10 DIAGNOSIS — R079 Chest pain, unspecified: Secondary | ICD-10-CM

## 2019-11-10 DIAGNOSIS — R002 Palpitations: Secondary | ICD-10-CM | POA: Diagnosis not present

## 2019-11-10 DIAGNOSIS — M545 Low back pain: Secondary | ICD-10-CM | POA: Diagnosis not present

## 2019-11-10 DIAGNOSIS — I492 Junctional premature depolarization: Secondary | ICD-10-CM

## 2019-11-10 DIAGNOSIS — Z820 Family history of epilepsy and other diseases of the nervous system: Secondary | ICD-10-CM

## 2019-11-10 DIAGNOSIS — M79621 Pain in right upper arm: Secondary | ICD-10-CM | POA: Diagnosis not present

## 2019-11-10 MED ORDER — DILTIAZEM HCL ER COATED BEADS 120 MG PO CP24: 120.0000 mg | ORAL_CAPSULE | Freq: Every day | ORAL | 3 refills | Status: DC

## 2019-11-10 NOTE — Patient Instructions (Signed)
Medication Instructions:  1- START Diltiazem 120 mg total once daily *If you need a refill on your cardiac medications before your next appointment, please call your pharmacy*   Lab Work: none ordered If you have labs (blood work) drawn today and your tests are completely normal, you will receive your results only by: [place] MyChart Message (if you have MyChart) OR . A paper copy in the mail If you have any lab test that is abnormal or we need to change your treatment, we will call you to review the results.   Testing/Procedures: none ordered   Follow-Up: At [facility], you and your health needs are our priority.  As part of our continuing mission to provide you with exceptional heart care, we have created designated Provider Care Teams.  These Care Teams include your primary Cardiologist (physician) and Advanced Practice Providers (APPs -  Physician Assistants and Nurse Practitioners) who all work together to provide you with the care you need, when you need it.  We recommend signing up for the patient portal called "MyChart".  Sign up information is provided on this After Visit Summary.  MyChart is used to connect with patients for Virtual Visits (Telemedicine).  Patients are able to view lab/test results, encounter notes, upcoming appointments, etc.  Non-urgent messages can be sent to your provider as well.   To learn more about what you can do with MyChart, go to [email or web address].    Your next appointment:   2 week(s)  The format for your next appointment:   In Person  Provider:     You may see Dr. Saunders Revel or Marrianne Mood, PA-C    Other Instructions Ref for sleep study

## 2019-11-10 NOTE — Progress Notes (Signed)
Office Visit    Patient Name: Sabrina Smith Date of Encounter: 11/10/2019  Primary Care Provider:  Ria Bush, MD Primary Cardiologist:  No primary care provider on file.  Chief Complaint    Chief Complaint  Patient presents with  . office visit    6 week F/U; Meds verbally reviewed with patient.    39-year female with history of hypertension, asthma, GERD, palpitations, PACs, and seen for 6 week follow-up.  Past Medical History    Past Medical History:  Diagnosis Date  . Allergic rhinitis    severe ([facility])  . Asthma    as a child  . History of chicken pox   . Hypertension   . Shingles 1990s   Past Surgical History:  Procedure Laterality Date  . COLONOSCOPY  02/2018   hemorrhoids, WNL ([facility])    Allergies  Allergies  Allergen Reactions  . Other     Soybean, Almond, Hazelnut  . Sulfa Drugs Cross Reactors Hives    History of Present Illness    Sabrina Smith is a 39 y.o. female with PMH as above.  She established with [facility] cardiology 09/30/2019.  During her last office visit with Dr. Saunders Revel, she reported a 1 year history of progressive (frequency) palpitations.  She stated it felt as if her heart was" gasping for air" at times.  She felt most aware of her palpitations when sitting and lying still.  She denied any exertional chest pain, shortness of breath, or palpitations. Previous EKG interpreted as sinus bradycardia with frequent PACs, as well as sinus rhythm with premature junctional beats.  She denied any loss of consciousness.  She also reported that she had not undergone previous cardiac work-up.  She consumes 1 cup of coffee 2-3 times per week.  She reported previous medications to control her blood pressure; however, she has since weaned off of these.  Recommendation was to check blood work and obtain TTE, as well as decrease caffeine intake.  Beta-blocker was discussed; however, given her multiple allergies for which she carried an EpiPen, this was  deferred.  It was noted that if symptoms persist, low-dose diltiazem could be considered.  BP during her office visit was borderline with DBP 67mmHg.  Lifestyle modifications encouraged.  Today, she returns to clinic and reports no significant change in symptoms from those previously reported as above.  She reports ongoing palpitations that are mainly are felt when she is lying and sitting still, persisting for a couple of minutes before resolving.  She states these occur a few times per day.  She has not noticed any clear exacerbating or alleviating factors.  She has decreased her caffeine intake to approximately 1-1/2 cup/week.  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  She works from 4 AM to 1 PM or even 3 PM 6 days/week.  She periodically notes a headache when she wakes and often does not feel well rested, though she attributes this to the time at which she needs to wake.  She does note that sleep apnea runs in her family.  She denies any known periods where she stops breathing or snores.  She reports atypical chest pain, mainly ongoing GERD for which she takes omeprazole 40 mg daily.  GERD symptoms are worse when lying down.  She denies presyncope, syncope, falls, orthopnea, PND, weight gain, or early satiety.  She denies any SOB/DOE, using her inhaler on a rare occasion.  She does note very mild lower extremity edema that usually occurs with long car trips and improves with leg elevation.  She does not have a regular exercise routine.  Home Medications    Prior to Admission medications   Medication Sig Start Date End Date Taking? Authorizing Provider  albuterol (PROAIR HFA) 108 (90 Base) MCG/ACT inhaler Inhale 2 puffs into the lungs every 4 (four) hours as needed. 12/11/17   Garnet Sierras, DO  DRYSOL 20 % external solution Apply topically daily. 09/14/19   [provider]  EPINEPHrine (AUVI-Q) 0.3 mg/0.3 mL IJ SOAJ injection Use as directed  for severe allergic reaction 05/07/18   Garnet Sierras, DO  omeprazole (PRILOSEC) 40 MG capsule Take 1 capsule (40 mg total) by mouth daily as needed (heartburn). 09/28/19   Ria Bush, MD  Promethazine HCl 6.25 MG/5ML SOLN  07/03/19   [provider]  XHANCE 93 MCG/ACT [place] BLOW 2 DOSES IN EACH NOSTRIL TWICE DAILY 07/08/19   Bobbitt, [place] Muta, MD    Review of Systems    She denies dyspnea, pnd, orthopnea, n, v, dizziness, syncope, weight gain, or early satiety.  She reports ongoing atypical chest pain/GERD.  She reports mild bilateral lower extremity edema that occurs when riding in the car for long trips.  She reports ongoing/unchanged palpitations.  She has occasional morning headache when waking and intermittent fatigue.   All other systems reviewed and are otherwise negative except as noted above.  Physical Exam    VS:  BP 112/80 (BP Location: Left Arm, Patient Position: Sitting, Cuff Size: Normal)   Pulse 72   Ht 5\' 2"  (1.575 m)   Wt 179 lb 8 oz (81.4 kg)   SpO2 97%   BMI 32.83 kg/m  , BMI Body mass index is 32.83 kg/m. GEN: Well nourished, well developed, in no acute distress. HEENT: normal. Neck: Supple, no JVD, carotid bruits, or masses. Cardiac: RRR, no murmurs, rubs, or gallops. No clubbing, cyanosis, pitting edema.  Radials/DP/PT 2+ and equal bilaterally.  Respiratory:  Respirations regular and unlabored, clear to auscultation bilaterally. GI: Soft, nontender, nondistended, BS + x 4. MS: no deformity or atrophy. Skin: warm and dry, no rash. Neuro:  Strength and sensation are intact. Psych: Normal affect.  Accessory Clinical Findings    ECG personally reviewed by me today -NSR, 72 bpm, septal infarct versus lead placement, poor R wave progression in II/aVF versus lead placement, premature junctional beats seen on previous EKG resolved, LVH.- no acute changes.  VITALS Reviewed today   Temp Readings from Last 3 Encounters:  09/28/19 97.6 F (36.4 C)  (Temporal)  04/22/19 97.9 F (36.6 C)  03/25/19 97.7 F (36.5 C) (Temporal)   BP Readings from Last 3 Encounters:  11/10/19 112/80  09/30/19 120/90  09/28/19 120/80   Pulse Readings from Last 3 Encounters:  11/10/19 72  09/30/19 64  09/28/19 58    Wt Readings from Last 3 Encounters:  11/10/19 179 lb 8 oz (81.4 kg)  09/30/19 175 lb 12.8 oz (79.7 kg)  09/28/19 178 lb 9 oz (81 kg)     LABS  reviewed today   Lab Results  Component Value Date   WBC 9.9 06/19/2019   HGB 14.7 06/19/2019   HCT 43.9 06/19/2019   MCV 86 06/19/2019   PLT 286 06/19/2019   Lab Results  Component Value Date   CREATININE 0.73 09/30/2019   BUN 10 09/30/2019   NA 142 09/30/2019   K 4.0 09/30/2019  CL 104 09/30/2019   CO2 24 09/30/2019   Lab Results  Component Value Date   ALT 14 09/30/2019   AST 15 09/30/2019   ALKPHOS 74 09/30/2019   BILITOT 0.4 09/30/2019   Lab Results  Component Value Date   CHOL 146 12/15/2013   HDL 39.80 12/15/2013   LDLCALC 98 12/15/2013   TRIG 39.0 12/15/2013   CHOLHDL 4 12/15/2013    No results found for: HGBA1C Lab Results  Component Value Date   TSH 2.080 06/19/2019     STUDIES/PROCEDURES reviewed today  Echo 10/27/2019 1. Left ventricular ejection fraction, by estimation, is 55 to 60%. The  left ventricle has normal function. The left ventricle has no regional  wall motion abnormalities. Left ventricular diastolic parameters were  normal.  2. Right ventricular systolic function is normal. The right ventricular  size is normal. There is normal pulmonary artery systolic pressure. The  estimated right ventricular systolic pressure is 26.3 mmHg.  3. Mild mitral valve regurgitation.   Assessment & Plan    Palpitations with history of premature junctional beats on EKG -Palpitations of been present for over a year with report that they have been progressive.  EKG today without ectopy.  CMP and magnesium WNL.  Echo as above with normal EF 55 to 60%,  NRWMA, mild MR.  We discussed TTE results in detail.  Given her history of multiple allergies for which she carries an EpiPen, preference is to avoid beta-blockers.  Given persistence of symptoms, will start low-dose diltiazem 120 mg daily.  Reviewed that this may cause lower extremity edema with patient understanding.  She will continue to monitor her caffeine intake and try to reduce stress.  Reassess symptoms in 2 weeks.  If worsening symptoms or symptoms persist despite medication management, consider cardiac monitoring.  Will defer for now.  Chest pain --Atypical.  EKG today without acute ST/T changes but continues to show poor R wave progression in V1-V2.  Echo as above with normal pump function and no regional wall motion abnormalities.  Low suspicion for obstructive CAD, given her young age.  She will closely monitor her symptoms, reporting that she feels her chest discomfort is related to her GERD.  She does note that it is often improved with sitting up and worse laying down.  Hypertension -BP today 112/80 with DBP improved from 90 mmHg to 80 mmHg.  Continue to work on lifestyle modifications, including weight loss and sodium restriction.  We will start diltiazem 120 mg daily.  Intermittent fatigue/morning headache Family history of sleep apnea --Reports intermittent fatigue without clear etiology and morning headache.  Echo without any signs of increased pulmonary pressures.  We discussed the signs and symptoms of sleep apnea with decision to place a referral for a sleep study now in the event she wishes to pursue a sleep study.  Medication changes: Cardizem 120 mg daily Labs ordered: None Studies / Imaging ordered: Referral for sleep study, in the event she decides to pursue this given her family history and morning headaches/fatigue. Disposition: RTC 2 weeks to reassess symptoms after initiation of Cardizem.  She will call the office if any questions or concerns before that  time.     Arvil Chaco, PA-C 11/10/2019

## 2019-11-11 DIAGNOSIS — F4322 Adjustment disorder with anxiety: Secondary | ICD-10-CM | POA: Diagnosis not present

## 2019-11-12 DIAGNOSIS — M25551 Pain in right hip: Secondary | ICD-10-CM | POA: Diagnosis not present

## 2019-11-12 DIAGNOSIS — M542 Cervicalgia: Secondary | ICD-10-CM | POA: Diagnosis not present

## 2019-11-12 DIAGNOSIS — M791 Myalgia, unspecified site: Secondary | ICD-10-CM | POA: Diagnosis not present

## 2019-11-12 DIAGNOSIS — M533 Sacrococcygeal disorders, not elsewhere classified: Secondary | ICD-10-CM | POA: Diagnosis not present

## 2019-11-13 DIAGNOSIS — M791 Myalgia, unspecified site: Secondary | ICD-10-CM | POA: Diagnosis not present

## 2019-11-13 DIAGNOSIS — M25551 Pain in right hip: Secondary | ICD-10-CM | POA: Diagnosis not present

## 2019-11-13 DIAGNOSIS — M542 Cervicalgia: Secondary | ICD-10-CM | POA: Diagnosis not present

## 2019-11-13 DIAGNOSIS — M533 Sacrococcygeal disorders, not elsewhere classified: Secondary | ICD-10-CM | POA: Diagnosis not present

## 2019-11-17 NOTE — Progress Notes (Signed)
Cardiology Office Note    Date:  11/24/2019   ID:  Sabrina Smith, DOB 1981/02/14, MRN [id number]  PCP:  Ria Bush, MD  Cardiologist:  Nelva Bush, MD  Electrophysiologist:  None   Chief Complaint: Follow up  History of Present Illness:   Sabrina Smith is a 39 y.o. female with history of HTN, palpitations, asthma, and GERD who presents for follow up of palpitations.   She was seen by Dr, End as a new patient for evaluation of palpitations on 09/30/2019. She had reported a 1-year history of palpitations, most notable when she was still.  She also noted atypical chest pain. Recent EKG with her PCP's office interpreted as sinus bradycardia with frequent PACs. EKG at her visit with Dr. Saunders Revel showed sinus rhythm with frequent premature junctional beats, borderline LVH, and poor R wave progression. There was no significant change when compared to study performed in her PCP's office. Echo showed an EF of 55-60%, no RWMA, normal LV diastolic function parameters, normal RVSF and ventricular cavity size, mild mitral regurgitation, and normal PASP. She was seen in follow up by Marrianne Mood, PA-C on 11/10/2019 and had decreased her caffeine intake, though continued to note palpitations. In this setting, she was started on Cardizem CD 120 mg. Beta blocker was deferred in the context of significant underlying allergies, for which she carried an EpiPen. With regards to fatigue, she was referred to pulmonology for sleep study.    She comes in today feeling about the same as she has at her previous 2 visits.  She has not noticed any significant change in her palpitation burden or symptoms with the addition of Cardizem CD.  She continues to note these palpitations occurring several days per week lasting several minutes in duration at times feels like her "heart is gasping for air."  Palpitations are typically noted when she is at rest.  She drinks 2 to 3 cups of coffee per week at this point and  denies any other caffeine intake.  She drinks a rare glass of wine.  No illicit substances.  No family history of known arrhythmias.  She is under significant stress at work though this is not new for her.  She will occasionally note a sudden onset profound sensation of generalized fatigue and malaise that will last for a few minutes then spontaneously resolved.  She continues to note chest discomfort that is sharp and typically noted when she lays down.  No exertional symptoms.  This is unchanged when compared to her prior visits.   Labs independently reviewed: 09/2019 - magnesium 2.1, BUN 10, SCr 0.73, potassium 4.0, albumin 4.6, AST/ALT normal 05/2019 - TSH normal, HGB 14.7, PLT 286  Past Medical History:  Diagnosis Date   Allergic rhinitis    severe ([place])   Asthma    as a child   History of chicken pox    Hypertension    Shingles 1990s    Past Surgical History:  Procedure Laterality Date   COLONOSCOPY  02/2018   hemorrhoids, WNL ([facility])    Current Medications: Current Meds  Medication Sig   albuterol (PROAIR HFA) 108 (90 Base) MCG/ACT inhaler Inhale 2 puffs into the lungs every 4 (four) hours as needed.   diltiazem (CARDIZEM CD) 120 MG 24 hr capsule Take 1 capsule (120 mg total) by mouth daily.   DRYSOL 20 % external solution Apply topically as needed.    EPINEPHrine (AUVI-Q) 0.3 mg/0.3 mL IJ SOAJ injection Use as directed for  severe allergic reaction   omeprazole (PRILOSEC) 40 MG capsule Take 1 capsule (40 mg total) by mouth daily as needed (heartburn).   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[place] [employer]  Tobacco Use   Smoking status: Former Smoker    Quit date: 09/27/2010    Years since quitting: 9.1   Smokeless tobacco: Never Used  Vaping Use   Vaping Use: Never used  Substance and Sexual Activity   Alcohol use: Yes    Alcohol/week: 1.0 standard drink    Types: 1 Glasses of wine per week    Comment: occassionally   Drug use: No   Sexual activity: Yes    Birth control/protection: None  Other Topics Concern   Not on file  Social History Narrative   Caffeine: none   Lives with daughter (2006), no pets   Occupation: Tour manager   Edu: bachelor's   Activity: treadmill and workout tapes   Diet: seldom water, fruits/vegetables daily, red meat rare, fish 3x/wk   Social Determinants of Radio broadcast assistant Strain:    Difficulty of Paying Living Expenses: Not on file  Food Insecurity:    Worried About Charity fundraiser in the Last Year: Not on file   [employer] of Food in the Last Year: Not on file  Transportation Needs:    Lack of Transportation (Medical): Not on file   Lack of Transportation (Non-Medical): Not on file  Physical Activity:    Days of Exercise per Week: Not on file   Minutes of Exercise per Session: Not on file  Stress:    Feeling of Stress : Not on file  Social Connections:    Frequency of Communication with Friends and Family: Not on file   Frequency of Social Gatherings with Friends and Family: Not on file   Attends Religious Services: Not on file   Active Member of Clubs or Organizations: Not on file   Attends Archivist Meetings: Not on file   Marital Status: Not on file     Family History:  The patient's family history includes Asthma in her daughter; Cancer in her maternal grandfather; Diabetes in her maternal aunt, maternal grandfather, and maternal uncle; Eczema in her daughter; Heart failure in her cousin and maternal grandmother; Hypertension in her brother, maternal  aunt, maternal uncle, and mother; Stroke in her maternal uncle. There is no history of Allergic rhinitis or Urticaria.  ROS:   Review of Systems  Constitutional: Positive for malaise/fatigue. Negative for chills, diaphoresis, fever and weight loss.  HENT: Negative for congestion.   Eyes: Negative for discharge and redness.  Respiratory: Negative for cough, sputum production, shortness of breath and wheezing.   Cardiovascular: Positive for chest pain and palpitations. Negative for orthopnea, claudication, leg swelling and PND.  Gastrointestinal: Negative for abdominal pain, heartburn, nausea and vomiting.  Musculoskeletal: Negative for falls and myalgias.  Skin: Negative for rash.  Neurological: Negative for dizziness, tingling, tremors, sensory change, speech change, focal weakness, loss of consciousness  and weakness.  Endo/Heme/Allergies: Does not bruise/bleed easily.  Psychiatric/Behavioral: Negative for substance abuse. The patient is not nervous/anxious.   All other systems reviewed and are negative.    EKGs/Labs/Other Studies Reviewed:    Studies reviewed were summarized above. The additional studies were reviewed today:  2D echo 09/2019: 1. Left ventricular ejection fraction, by estimation, is 55 to 60%. The  left ventricle has normal function. The left ventricle has no regional  wall motion abnormalities. Left ventricular diastolic parameters were  normal.  2. Right ventricular systolic function is normal. The right ventricular  size is normal. There is normal pulmonary artery systolic pressure. The  estimated right ventricular systolic pressure is 11.9 mmHg.  3. Mild mitral valve regurgitation.    EKG:  EKG is ordered today.  The EKG ordered today demonstrates NSR, 72 bpm, poor R progression along with V1 V2, no acute ST-T changes, when compared to prior tracing no significant change  Recent Labs: 06/19/2019: Hemoglobin 14.7; Platelets 286; TSH 2.080 09/30/2019: ALT 14;  BUN 10; Creatinine, Ser 0.73; Magnesium 2.1; Potassium 4.0; Sodium 142  Recent Lipid Panel    Component Value Date/Time   CHOL 146 12/15/2013 0818   TRIG 39.0 12/15/2013 0818   HDL 39.80 12/15/2013 0818   CHOLHDL 4 12/15/2013 0818   VLDL 7.8 12/15/2013 0818   LDLCALC 98 12/15/2013 0818    PHYSICAL EXAM:    VS:  BP 104/70 (BP Location: Left Arm, Patient Position: Sitting, Cuff Size: Normal)    Pulse 73    Ht 5\' 2"  (1.575 m)    Wt 181 lb (82.1 kg)    SpO2 98%    BMI 33.11 kg/m   BMI: Body mass index is 33.11 kg/m.  Physical Exam Constitutional:      Appearance: She is well-developed.  HENT:     Head: Normocephalic and atraumatic.  Eyes:     General:        Right eye: No discharge.        Left eye: No discharge.  Neck:     Vascular: No JVD.  Cardiovascular:     Rate and Rhythm: Normal rate and regular rhythm.  Extrasystoles are present.    Pulses: No midsystolic click and no opening snap.          Posterior tibial pulses are 2+ on the right side and 2+ on the left side.     Heart sounds: Normal heart sounds, S1 normal and S2 normal. Heart sounds not distant. No murmur heard.  No friction rub.  Pulmonary:     Effort: Pulmonary effort is normal. No respiratory distress.     Breath sounds: Normal breath sounds. No decreased breath sounds, wheezing or rales.  Chest:     Chest wall: No tenderness.  Abdominal:     General: There is no distension.     Palpations: Abdomen is soft.     Tenderness: There is no abdominal tenderness.  Musculoskeletal:     Cervical back: Normal range of motion.  Skin:    General: Skin is warm and dry.     Nails: There is no clubbing.  Neurological:     Mental Status: She is alert and oriented to person, place, and time.  Psychiatric:        Speech: Speech normal.        Behavior: Behavior normal.        Thought Content: Thought content normal.        Judgment: Judgment normal.  Wt Readings from Last 3 Encounters:  11/24/19 181 lb (82.1  kg)  11/10/19 179 lb 8 oz (81.4 kg)  09/30/19 175 lb 12.8 oz (79.7 kg)     ASSESSMENT & PLAN:   1. Palpitations: Longstanding history of palpitations stretch back greater than 1 year though more recently these have increased in frequency.  Recently started on Cardizem CD without any significant improvement in symptoms.  Place a Zio patch for further evaluation.  Recent labs unrevealing as outlined above.  Continue Cardizem CD.  Continue to minimize caffeine intake.  2. HTN: Blood pressure well controlled.  Continue Cardizem CD as outlined above.  3. Chest pain: Symptoms are overall atypical and less concerning for ischemia.  Based on Zio patch results could consider noninvasive testing if indicated.  Disposition: F/u with Dr. Saunders Revel or an APP in 6 weeks.   Medication Adjustments/Labs and Tests Ordered: Current medicines are reviewed at length with the patient today.  Concerns regarding medicines are outlined above. Medication changes, Labs and Tests ordered today are summarized above and listed in the Patient Instructions accessible in Encounters.   Signed, Christell Faith, PA-C 11/24/2019 4:30 PM     [facility] [address] [place], [place] [address] [phone]

## 2019-11-18 DIAGNOSIS — M25551 Pain in right hip: Secondary | ICD-10-CM | POA: Diagnosis not present

## 2019-11-18 DIAGNOSIS — M533 Sacrococcygeal disorders, not elsewhere classified: Secondary | ICD-10-CM | POA: Diagnosis not present

## 2019-11-18 DIAGNOSIS — M542 Cervicalgia: Secondary | ICD-10-CM | POA: Diagnosis not present

## 2019-11-18 DIAGNOSIS — M791 Myalgia, unspecified site: Secondary | ICD-10-CM | POA: Diagnosis not present

## 2019-11-23 DIAGNOSIS — M533 Sacrococcygeal disorders, not elsewhere classified: Secondary | ICD-10-CM | POA: Diagnosis not present

## 2019-11-23 DIAGNOSIS — M791 Myalgia, unspecified site: Secondary | ICD-10-CM | POA: Diagnosis not present

## 2019-11-23 DIAGNOSIS — M542 Cervicalgia: Secondary | ICD-10-CM | POA: Diagnosis not present

## 2019-11-23 DIAGNOSIS — M25551 Pain in right hip: Secondary | ICD-10-CM | POA: Diagnosis not present

## 2019-11-24 ENCOUNTER — Ambulatory Visit (INDEPENDENT_AMBULATORY_CARE_PROVIDER_SITE_OTHER): Payer: Federal, State, Local not specified - PPO | Admitting: Physician Assistant

## 2019-11-24 ENCOUNTER — Other Ambulatory Visit: Payer: Self-pay

## 2019-11-24 ENCOUNTER — Ambulatory Visit (INDEPENDENT_AMBULATORY_CARE_PROVIDER_SITE_OTHER): Payer: Federal, State, Local not specified - PPO

## 2019-11-24 ENCOUNTER — Encounter: Payer: Self-pay | Admitting: Physician Assistant

## 2019-11-24 VITALS — BP 104/70 | HR 73 | Ht 62.0 in | Wt 181.0 lb

## 2019-11-24 DIAGNOSIS — I10 Essential (primary) hypertension: Secondary | ICD-10-CM | POA: Diagnosis not present

## 2019-11-24 DIAGNOSIS — R002 Palpitations: Secondary | ICD-10-CM

## 2019-11-24 DIAGNOSIS — R079 Chest pain, unspecified: Secondary | ICD-10-CM | POA: Diagnosis not present

## 2019-11-24 NOTE — Patient Instructions (Signed)
Medication Instructions:  Your physician recommends that you continue on your current medications as directed. Please refer to the Current Medication list given to you today.  *If you need a refill on your cardiac medications before your next appointment, please call your pharmacy*   Lab Work: None ordered  If you have labs (blood work) drawn today and your tests are completely normal, you will receive your results only by: Marland Kitchen MyChart Message (if you have MyChart) OR . A paper copy in the mail If you have any lab test that is abnormal or we need to change your treatment, we will call you to review the results.   Testing/Procedures: Your physician has recommended that you wear a Zio monitor. This monitor is a medical device that records the heart's electrical activity. Doctors most often use these monitors to diagnose arrhythmias. Arrhythmias are problems with the speed or rhythm of the heartbeat. The monitor is a small device applied to your chest. You can wear one while you do your normal daily activities. While wearing this monitor if you have any symptoms to push the button and record what you felt. Once you have worn this monitor for the period of time provider prescribed (Usually 14 days), you will return the monitor device in the postage paid box. Once it is returned they will download the data collected and provide [place] with a report which the provider will then review and we will call you with those results. Important tips:  1. Avoid showering during the first 24 hours of wearing the monitor. 2. Avoid excessive sweating to help maximize wear time. 3. Do not submerge the device, no hot tubs, and no swimming pools. 4. Keep any lotions or oils away from the patch. 5. After 24 hours you may shower with the patch on. Take brief showers with your back facing the shower head.  6. Do not remove patch once it has been placed because that will interrupt data and decrease adhesive wear  time. 7. Push the button when you have any symptoms and write down what you were feeling. 8. Once you have completed wearing your monitor, remove and place into box which has postage paid and place in your outgoing mailbox.  9. If for some reason you have misplaced your box then call our office and we can provide another box and/or mail it off for you.      Follow-Up: At [facility], you and your health needs are our priority.  As part of our continuing mission to provide you with exceptional heart care, we have created designated Provider Care Teams.  These Care Teams include your primary Cardiologist (physician) and Advanced Practice Providers (APPs -  Physician Assistants and Nurse Practitioners) who all work together to provide you with the care you need, when you need it.  We recommend signing up for the patient portal called "MyChart".  Sign up information is provided on this After Visit Summary.  MyChart is used to connect with patients for Virtual Visits (Telemedicine).  Patients are able to view lab/test results, encounter notes, upcoming appointments, etc.  Non-urgent messages can be sent to your provider as well.   To learn more about what you can do with MyChart, go to [email or web address].    Your next appointment:   6 week(s)  The format for your next appointment:   In Person  Provider:   You may see Nelva Bush, MD or one of the following Advanced Practice Providers on your designated Care Team:  Murray Hodgkins, NP  Christell Faith, PA-C  Marrianne Mood, PA-C  Cadence Kathlen Mody, [place]

## 2019-11-25 DIAGNOSIS — M791 Myalgia, unspecified site: Secondary | ICD-10-CM | POA: Diagnosis not present

## 2019-11-25 DIAGNOSIS — M25551 Pain in right hip: Secondary | ICD-10-CM | POA: Diagnosis not present

## 2019-11-25 DIAGNOSIS — M542 Cervicalgia: Secondary | ICD-10-CM | POA: Diagnosis not present

## 2019-11-25 DIAGNOSIS — M533 Sacrococcygeal disorders, not elsewhere classified: Secondary | ICD-10-CM | POA: Diagnosis not present

## 2019-12-01 DIAGNOSIS — M25551 Pain in right hip: Secondary | ICD-10-CM | POA: Diagnosis not present

## 2019-12-01 DIAGNOSIS — M791 Myalgia, unspecified site: Secondary | ICD-10-CM | POA: Diagnosis not present

## 2019-12-01 DIAGNOSIS — M533 Sacrococcygeal disorders, not elsewhere classified: Secondary | ICD-10-CM | POA: Diagnosis not present

## 2019-12-01 DIAGNOSIS — M542 Cervicalgia: Secondary | ICD-10-CM | POA: Diagnosis not present

## 2019-12-03 DIAGNOSIS — F4322 Adjustment disorder with anxiety: Secondary | ICD-10-CM | POA: Diagnosis not present

## 2019-12-07 DIAGNOSIS — M542 Cervicalgia: Secondary | ICD-10-CM | POA: Diagnosis not present

## 2019-12-07 DIAGNOSIS — M791 Myalgia, unspecified site: Secondary | ICD-10-CM | POA: Diagnosis not present

## 2019-12-07 DIAGNOSIS — M25551 Pain in right hip: Secondary | ICD-10-CM | POA: Diagnosis not present

## 2019-12-07 DIAGNOSIS — M533 Sacrococcygeal disorders, not elsewhere classified: Secondary | ICD-10-CM | POA: Diagnosis not present

## 2019-12-09 DIAGNOSIS — M25551 Pain in right hip: Secondary | ICD-10-CM | POA: Diagnosis not present

## 2019-12-09 DIAGNOSIS — M791 Myalgia, unspecified site: Secondary | ICD-10-CM | POA: Diagnosis not present

## 2019-12-09 DIAGNOSIS — M533 Sacrococcygeal disorders, not elsewhere classified: Secondary | ICD-10-CM | POA: Diagnosis not present

## 2019-12-09 DIAGNOSIS — M542 Cervicalgia: Secondary | ICD-10-CM | POA: Diagnosis not present

## 2019-12-15 DIAGNOSIS — M791 Myalgia, unspecified site: Secondary | ICD-10-CM | POA: Diagnosis not present

## 2019-12-15 DIAGNOSIS — M542 Cervicalgia: Secondary | ICD-10-CM | POA: Diagnosis not present

## 2019-12-15 DIAGNOSIS — M25551 Pain in right hip: Secondary | ICD-10-CM | POA: Diagnosis not present

## 2019-12-15 DIAGNOSIS — M533 Sacrococcygeal disorders, not elsewhere classified: Secondary | ICD-10-CM | POA: Diagnosis not present

## 2019-12-17 ENCOUNTER — Ambulatory Visit (INDEPENDENT_AMBULATORY_CARE_PROVIDER_SITE_OTHER): Payer: Federal, State, Local not specified - PPO | Admitting: Adult Health

## 2019-12-17 ENCOUNTER — Other Ambulatory Visit: Payer: Self-pay

## 2019-12-17 ENCOUNTER — Encounter: Payer: Self-pay | Admitting: Adult Health

## 2019-12-17 VITALS — BP 126/72 | HR 79 | Temp 97.7°F | Ht 62.0 in | Wt 181.0 lb

## 2019-12-17 DIAGNOSIS — G4719 Other hypersomnia: Secondary | ICD-10-CM | POA: Diagnosis not present

## 2019-12-17 NOTE — Progress Notes (Signed)
Reviewed and agree with assessment/plan.   Chesley Mires, MD [facility] 12/17/2019, 7:17 PM Pager:  [phone]

## 2019-12-17 NOTE — Assessment & Plan Note (Signed)
Daytime hypersomnolence, restless sleep, snoring, elevated Epworth score.  Patient has signs and symptoms of possible underlying obstructive sleep apnea. We will proceed with a home sleep study for further evaluation patient education on a healthy sleep regimen.  Education on potential cardiovascular complications of underlying sleep apnea  Plan  Patient Instructions  Set up for home sleep study  Healthy sleep regimen  Do not drive if sleepy  Follow up in 6 weeks with videovisit or telemedicine visit to discuss results and As needed

## 2019-12-17 NOTE — Patient Instructions (Signed)
Set up for home sleep study  Healthy sleep regimen  Do not drive if sleepy  Follow up in 6 weeks with videovisit or telemedicine visit to discuss results and As needed

## 2019-12-17 NOTE — Progress Notes (Signed)
_0  ID: Sabrina Smith, female    DOB: 09-30-1980, 39 y.o.   MRN: [id number]  Chief Complaint  Patient presents with  . sleep consult    Referring provider: Arvil Chaco, PA*  HPI: 39 year old female former smoker seen for sleep consult December 17, 2019 for daytime sleepiness, restless sleep and daytime fatigue  TEST/EVENTS :   12/17/2019 Sleep consult  Patient presents for a sleep consult.  Patient complains that she is having ongoing daytime sleepiness, restless sleep and low energy.  Epworth score is 13.  Patient says that she is sleepy during the daytime with inactivity and feels very tired.  She has restless sleep throughout the night.  She goes to bed typically around 830 and gets up around 3:30 AM.  She takes about 15 to 20 minutes to fall asleep.  And gets up between 1-2 times each night.  Over the last 2 years her weight has went up 10 pounds.  She has never been evaluated for sleep apnea in the past.  He has never had a sleep study.  She does have some high blood pressure and asthma. Denies any symptoms of cataplexy or sleep paralysis. 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  Family members have reported snoring.  Patient says she gets sleepy with light activities.  She does not typically nap.  She drinks minimal caffeine only 2 to 3 cups of coffee a week.  She does work on the computer quite a bit.  She says she has asthma and uses ProAir rarely.   She has been followed by cardiology found to have some mild mitral valve regurgitation.  2D echo August 2021 showed EF at 55 to 60%, normal pulmonary artery systolic pressure.  And mild mitral valve regurgitation.  Patient has been having a few palpitations.   Social history.  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  She is single.  Has 1 child age 31.  She is from [facility].  She has no pets or hobbies.  Former smoker.  Surgical history has had no surgeries.  Family  history positive for coronary disease and hypertension  Medical history significant for chronic allergies.  She has a nut allergy and carries an EpiPen.  Mild asthma, palpitations, hypertension     Allergies  Allergen Reactions  . Other     Soybean, Almond, Hazelnut  . Sulfa Drugs Cross Reactors Hives    Immunization History  Administered Date(s) Administered  . Hep A / Hep B 12/17/2013  . Influenza,inj,Quad PF,6+ Mos 12/08/2012, 12/14/2013, 11/01/2016, 12/05/2017, 03/25/2019  . MMR 12/17/2013  . PFIZER SARS-COV-2 Vaccination 05/08/2019, 06/02/2019  . PPD Test 12/08/2012, 12/14/2013  . Tdap 04/11/2011    Past Medical History:  Diagnosis Date  . Allergic rhinitis    severe ([place])  . Asthma    as a child  . History of chicken pox   . Hypertension   . Shingles 1990s    Tobacco History: Social History   Tobacco Use  Smoking Status Former Smoker  . Years: 2.00  . Types: Cigarettes  . Quit date: 2006  . Years since quitting: 15.8  Smokeless Tobacco Never Used  Tobacco Comment   1cig daily   Counseling given: Not Answered Comment: 1cig daily   Outpatient Medications Prior to Visit  Medication Sig Dispense Refill  . albuterol (PROAIR HFA) 108 (90 Base) MCG/ACT inhaler Inhale 2 puffs into the lungs every 4 (  four) hours as needed. 1 Inhaler 1  . diltiazem (CARDIZEM CD) 120 MG 24 hr capsule Take 1 capsule (120 mg total) by mouth daily. 90 capsule 3  . DRYSOL 20 % external solution Apply topically as needed.     Marland Kitchen EPINEPHrine (AUVI-Q) 0.3 mg/0.3 mL IJ SOAJ injection Use as directed for severe allergic reaction 2 Device 1  . omeprazole (PRILOSEC) 40 MG capsule Take 1 capsule (40 mg total) by mouth daily as needed (heartburn). 30 capsule 3  . Promethazine HCl 6.25 MG/5ML SOLN     . XHANCE 93 MCG/ACT EXHU BLOW 2 DOSES IN EACH NOSTRIL TWICE DAILY 16 mL 0   No facility-administered medications prior to visit.     Review of Systems:   Constitutional:   No  weight  loss, night sweats,  Fevers, chills,  +fatigue, or  lassitude.  HEENT:   No headaches,  Difficulty swallowing,  Tooth/dental problems, or  Sore throat,                No sneezing, itching, ear ache, nasal congestion, post nasal drip,   CV:  No chest pain,  Orthopnea, PND, swelling in lower extremities, anasarca, dizziness, palpitations, syncope.   GI  No heartburn, indigestion, abdominal pain, nausea, vomiting, diarrhea, change in bowel habits, loss of appetite, bloody stools.   Resp: No shortness of breath with exertion or at rest.  No excess mucus, no productive cough,  No non-productive cough,  No coughing up of blood.  No change in color of mucus.  No wheezing.  No chest wall deformity  Skin: no rash or lesions.  GU: no dysuria, change in color of urine, no urgency or frequency.  No flank pain, no hematuria   MS:  No joint pain or swelling.  No decreased range of motion.  No back pain.    Physical Exam  BP 126/72 (BP Location: Left Arm, Cuff Size: Normal)   Pulse 79   Temp 97.7 F (36.5 C) (Temporal)   Ht _0  (1.575 m)   Wt 181 lb (82.1 kg)   SpO2 100%   BMI 33.11 kg/m   GEN: A/Ox3; pleasant , NAD, well nourished    HEENT:  [place]/AT,  NOSE-clear, THROAT-clear, no lesions, no postnasal drip or exudate noted.  Class II-III MP airway  NECK:  Supple w/ fair ROM; no JVD; normal carotid impulses w/o bruits; no thyromegaly or nodules palpated; no lymphadenopathy.    RESP  Clear  P & A; w/o, wheezes/ rales/ or rhonchi. no accessory muscle use, no dullness to percussion  CARD:  RRR, no m/r/g, no peripheral edema, pulses intact, no cyanosis or clubbing.  GI:   Soft & nt; nml bowel sounds; no organomegaly or masses detected.   Musco: Warm bil, no deformities or joint swelling noted.   Neuro: alert, no focal deficits noted.    Skin: Warm, no lesions or rashes    Lab Results:  CBC   BNP No results found for: BNP  ProBNP No results found for: PROBNP  Imaging: No  results found.    No flowsheet data found.  No results found for: NITRICOXIDE      Assessment & Plan:   Daytime hypersomnolence Daytime hypersomnolence, restless sleep, snoring, elevated Epworth score.  Patient has signs and symptoms of possible underlying obstructive sleep apnea. We will proceed with a home sleep study for further evaluation patient education on a healthy sleep regimen.  Education on potential cardiovascular complications of underlying sleep apnea  Plan  Patient Instructions  Set up for home sleep study  Healthy sleep regimen  Do not drive if sleepy  Follow up in 6 weeks with videovisit or telemedicine visit to discuss results and As needed           Rexene Edison, NP 12/17/2019

## 2019-12-22 DIAGNOSIS — M542 Cervicalgia: Secondary | ICD-10-CM | POA: Diagnosis not present

## 2019-12-22 DIAGNOSIS — M791 Myalgia, unspecified site: Secondary | ICD-10-CM | POA: Diagnosis not present

## 2019-12-22 DIAGNOSIS — M25551 Pain in right hip: Secondary | ICD-10-CM | POA: Diagnosis not present

## 2019-12-22 DIAGNOSIS — M533 Sacrococcygeal disorders, not elsewhere classified: Secondary | ICD-10-CM | POA: Diagnosis not present

## 2019-12-23 ENCOUNTER — Other Ambulatory Visit: Payer: Self-pay

## 2019-12-23 ENCOUNTER — Ambulatory Visit: Payer: Federal, State, Local not specified - PPO

## 2019-12-23 DIAGNOSIS — G4719 Other hypersomnia: Secondary | ICD-10-CM

## 2019-12-23 DIAGNOSIS — G4710 Hypersomnia, unspecified: Secondary | ICD-10-CM | POA: Diagnosis not present

## 2019-12-24 DIAGNOSIS — M542 Cervicalgia: Secondary | ICD-10-CM | POA: Diagnosis not present

## 2019-12-24 DIAGNOSIS — M25551 Pain in right hip: Secondary | ICD-10-CM | POA: Diagnosis not present

## 2019-12-24 DIAGNOSIS — M791 Myalgia, unspecified site: Secondary | ICD-10-CM | POA: Diagnosis not present

## 2019-12-24 DIAGNOSIS — M533 Sacrococcygeal disorders, not elsewhere classified: Secondary | ICD-10-CM | POA: Diagnosis not present

## 2019-12-28 DIAGNOSIS — G4710 Hypersomnia, unspecified: Secondary | ICD-10-CM | POA: Diagnosis not present

## 2019-12-30 ENCOUNTER — Telehealth: Payer: Self-pay | Admitting: Adult Health

## 2019-12-30 DIAGNOSIS — G4719 Other hypersomnia: Secondary | ICD-10-CM

## 2019-12-30 NOTE — Telephone Encounter (Signed)
Home sleep study from 12/23/19 was negative for sleep apnea. She had an AHI of 3.9 and SpO2 low of 86%.   Discussed sleep study results with patient.,  Advised on healthy sleep regimen.  We will set up for overnight oximetry test. Follow-up as directed and As needed

## 2019-12-30 NOTE — Telephone Encounter (Signed)
Called and spoke with patient, advised of the need for ONO.  She verbalized understanding.  Order for ONO placed.  Nothing further needed.

## 2020-01-03 IMAGING — CR DG CHEST 2V
2 series · 2 of 2 positions shown · non-contrast
Comparison: None.

CLINICAL DATA: Chest pain.

EXAM:
CHEST - 2 VIEW

[chest pa]
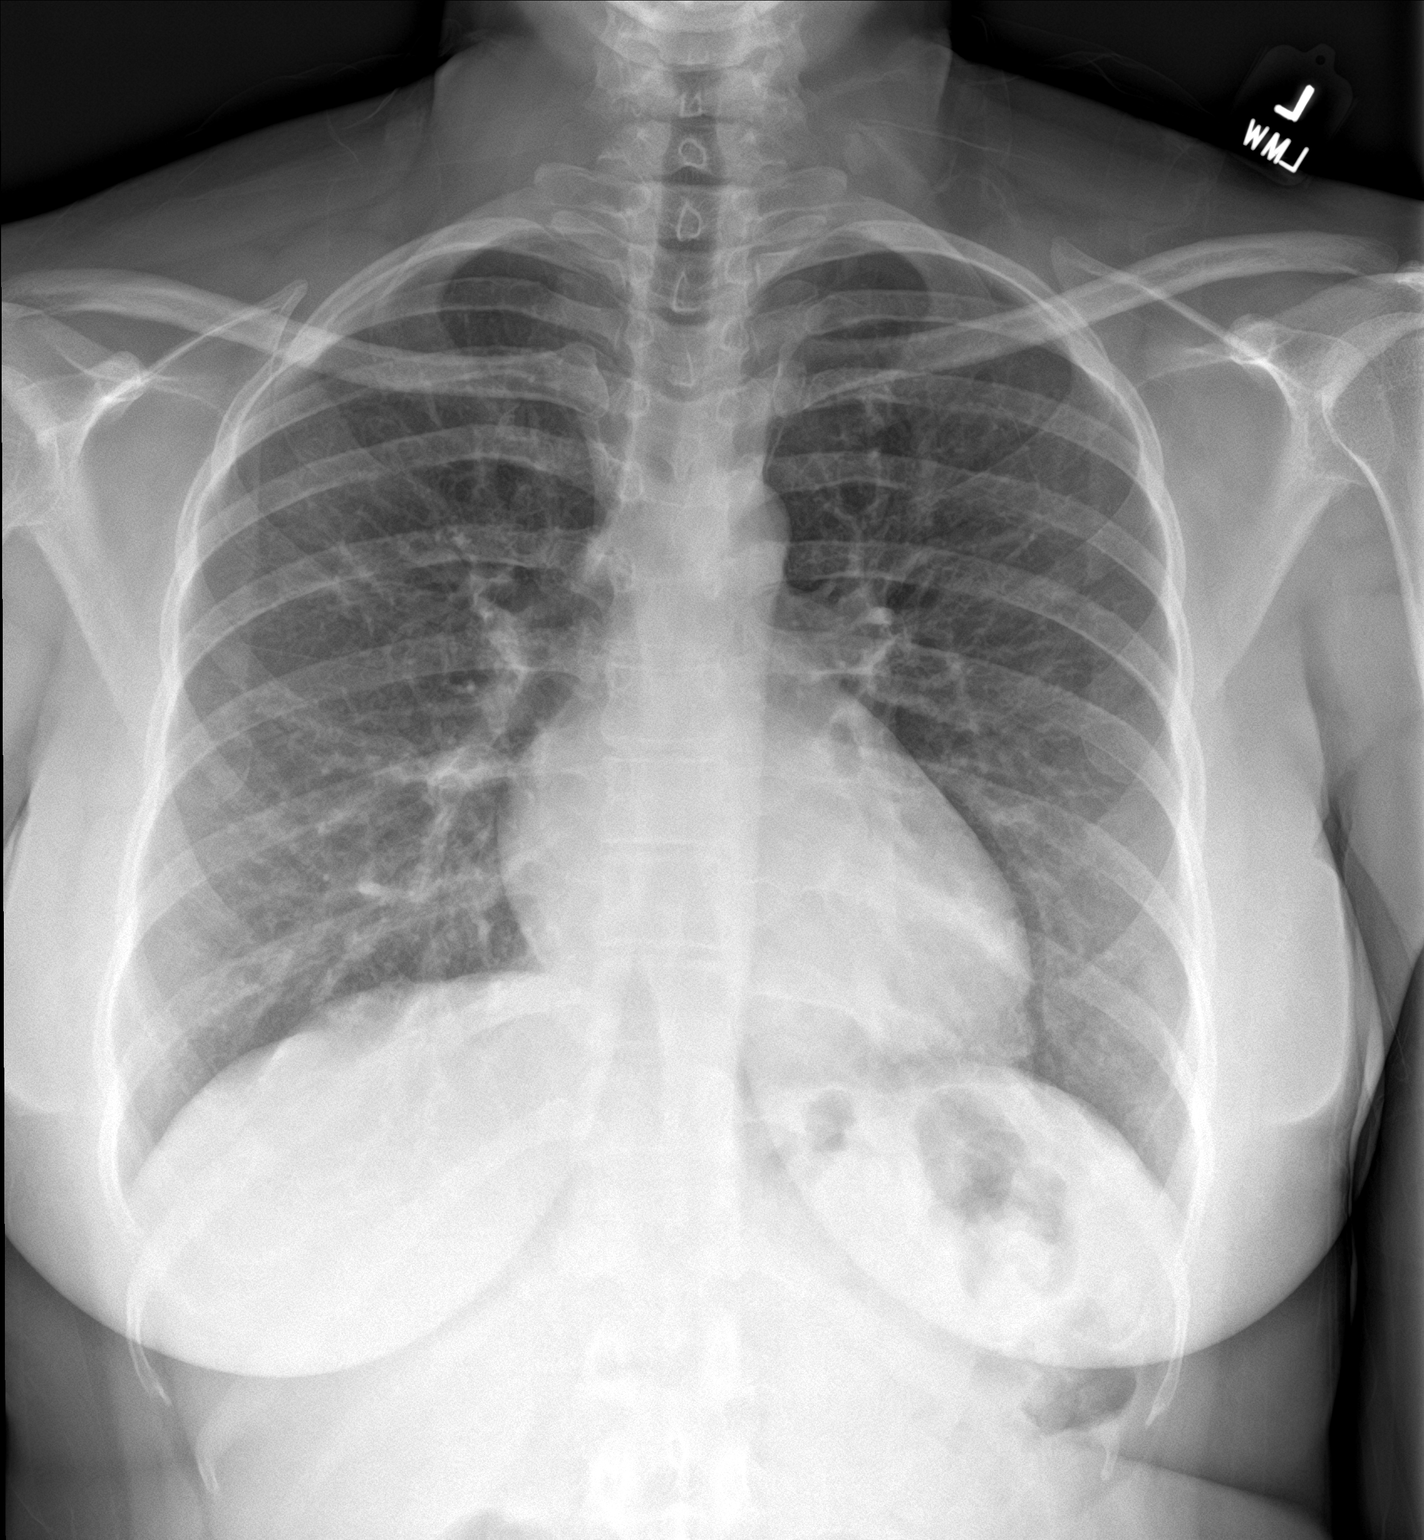

[chest lat]
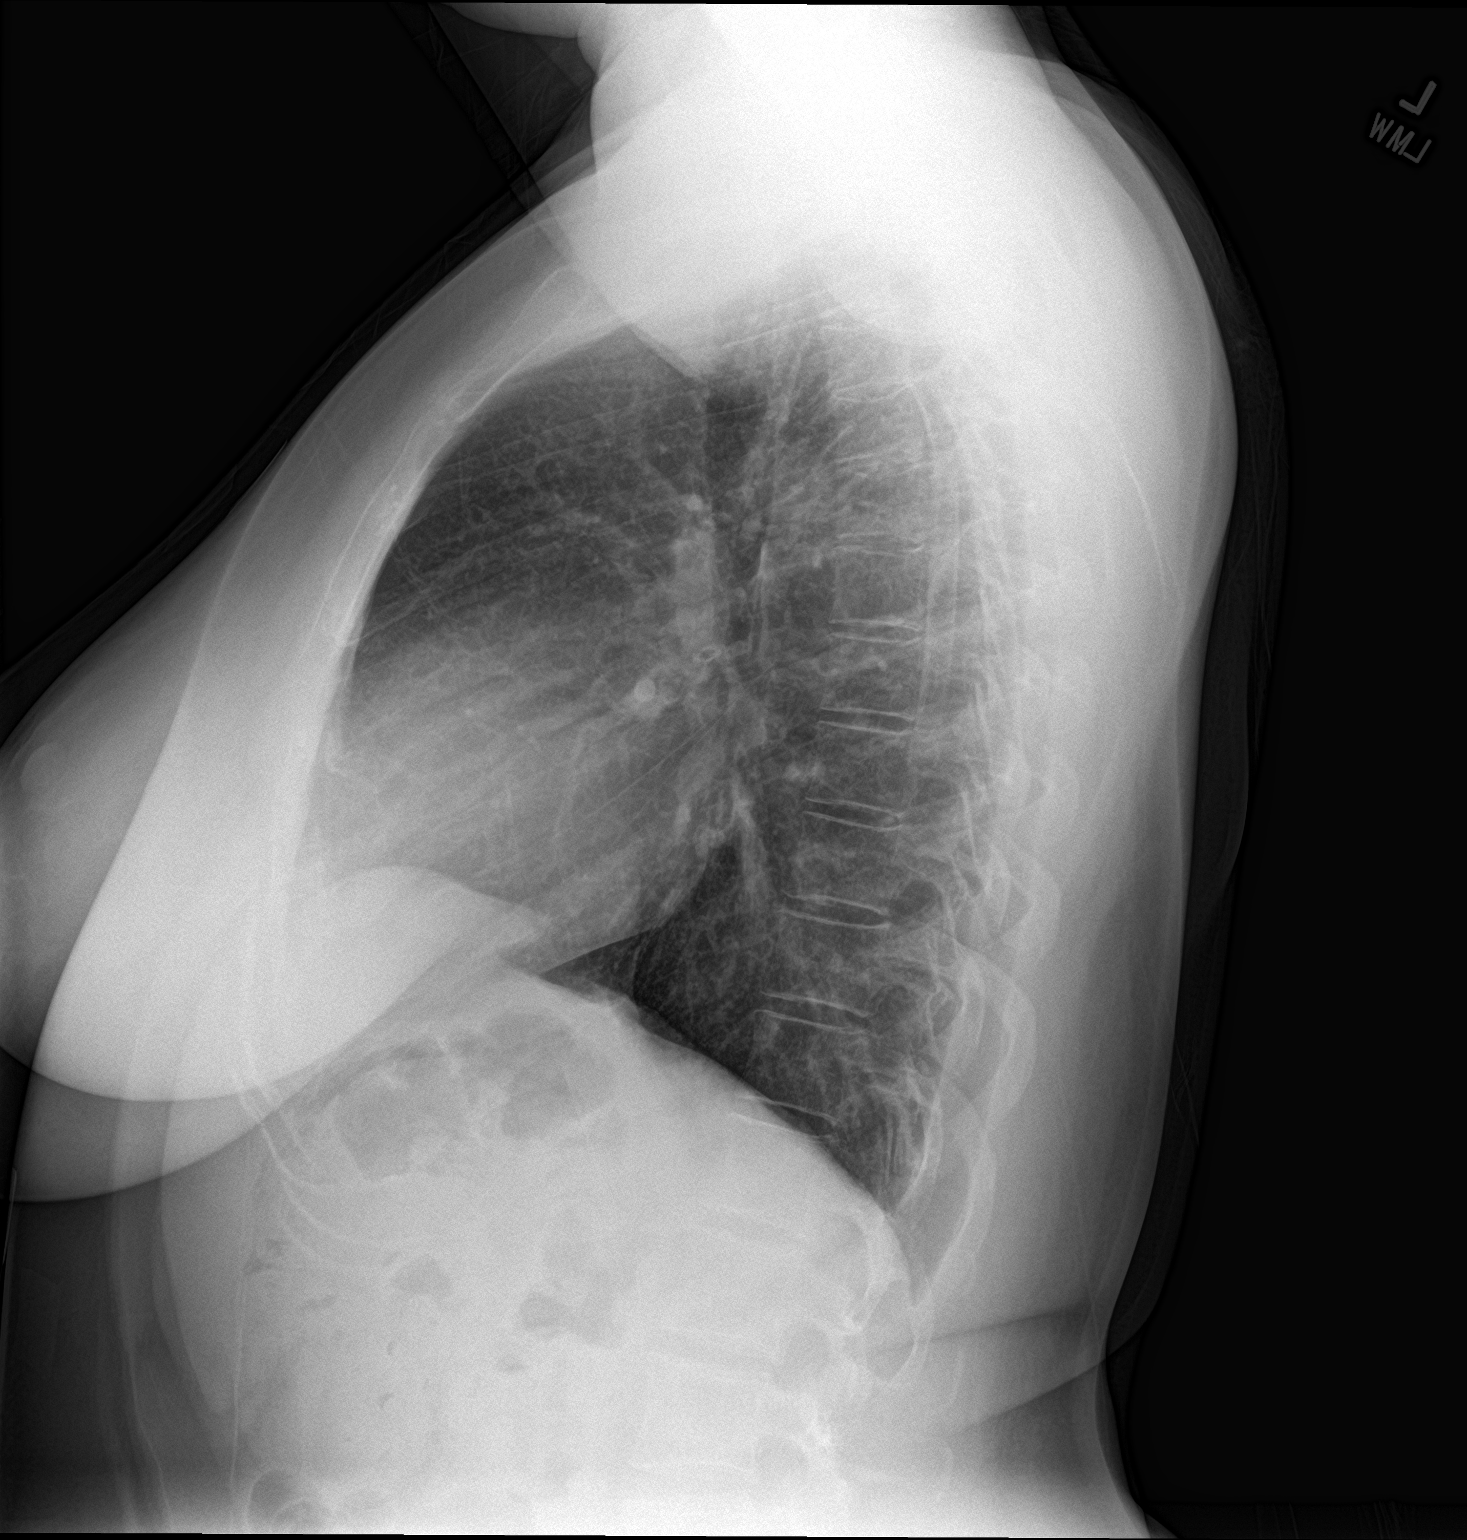

[2 of 2 positions shown; findings below may reference images not displayed]

FINDINGS: Mildly coarsened lung markings. The heart, hila, mediastinum, lungs,
and pleura are otherwise unremarkable. No focal infiltrate.
IMPRESSION: Mildly coarsened lung markings suggest bronchitic change. No focal
infiltrate or other acute abnormalities.

## 2020-01-05 ENCOUNTER — Encounter: Payer: Federal, State, Local not specified - PPO | Admitting: Family Medicine

## 2020-01-08 ENCOUNTER — Encounter: Payer: Self-pay | Admitting: Family Medicine

## 2020-01-08 ENCOUNTER — Telehealth: Payer: Self-pay

## 2020-01-08 ENCOUNTER — Ambulatory Visit: Payer: Federal, State, Local not specified - PPO | Admitting: Family Medicine

## 2020-01-08 ENCOUNTER — Other Ambulatory Visit: Payer: Self-pay

## 2020-01-08 VITALS — BP 112/68 | HR 73 | Temp 98.0°F | Resp 16 | Ht 62.5 in | Wt 185.3 lb

## 2020-01-08 DIAGNOSIS — J4520 Mild intermittent asthma, uncomplicated: Secondary | ICD-10-CM

## 2020-01-08 DIAGNOSIS — Z0279 Encounter for issue of other medical certificate: Secondary | ICD-10-CM

## 2020-01-08 DIAGNOSIS — Z13220 Encounter for screening for lipoid disorders: Secondary | ICD-10-CM

## 2020-01-08 DIAGNOSIS — Z91018 Allergy to other foods: Secondary | ICD-10-CM

## 2020-01-08 DIAGNOSIS — Z111 Encounter for screening for respiratory tuberculosis: Secondary | ICD-10-CM

## 2020-01-08 DIAGNOSIS — Z2839 Other underimmunization status: Secondary | ICD-10-CM

## 2020-01-08 DIAGNOSIS — Z23 Encounter for immunization: Secondary | ICD-10-CM | POA: Diagnosis not present

## 2020-01-08 DIAGNOSIS — Z0000 Encounter for general adult medical examination without abnormal findings: Secondary | ICD-10-CM | POA: Diagnosis not present

## 2020-01-08 DIAGNOSIS — K219 Gastro-esophageal reflux disease without esophagitis: Secondary | ICD-10-CM

## 2020-01-08 DIAGNOSIS — Z283 Underimmunization status: Secondary | ICD-10-CM

## 2020-01-08 DIAGNOSIS — R002 Palpitations: Secondary | ICD-10-CM

## 2020-01-08 DIAGNOSIS — I10 Essential (primary) hypertension: Secondary | ICD-10-CM

## 2020-01-08 NOTE — Telephone Encounter (Signed)
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    [Form is in basket on [employer], pending TB and lab results.]

## 2020-01-08 NOTE — Assessment & Plan Note (Signed)
Appreciate cards care. S/p reassuring eval (echo, zio patch)

## 2020-01-08 NOTE — Assessment & Plan Note (Signed)
Nursing program forms filled out. PPD placed today. Flu shot today. Titers will be drawn when she returns fasting for labs.

## 2020-01-08 NOTE — Assessment & Plan Note (Signed)
Managed with PRN omeprazole.

## 2020-01-08 NOTE — Assessment & Plan Note (Addendum)
Preventative protocols reviewed and updated unless pt declined. Discussed healthy diet and lifestyle.  

## 2020-01-08 NOTE — Progress Notes (Signed)
Patient ID: Sabrina Smith, female    DOB: [birth year], 39 y.o.   MRN: [id number]  This visit was conducted in person.  BP 112/68 (BP Location: Left Arm, Patient Position: Sitting, Cuff Size: Normal)   Pulse 73   Temp 98 F (36.7 C) (Temporal)   Resp 16   Ht 5' 2.5" (1.588 m)   Wt 185 lb 5 oz (84.1 kg)   LMP 12/19/2019   SpO2 98%   BMI 33.35 kg/m    CC: CPE Subjective:   HPI: Sabrina Smith is a 39 y.o. female presenting on 01/08/2020 for Annual Exam (Has school forms to be completed. )   Needs form filled for work.  Not fasting today.  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  Has seen cardiology for palpitations - EKG showed NSR with premature junctional beats and longstanding poor R wave progression in V1-V2. Echocardiogram showed EF 55-60%, mild MVR. Started on diltiazem 120mg  daily without much improvement. Zio patch also reassuring.   Daytime somnolence - home sleep test negative for sleep apnea but did show SpO2 drop to 86% - planned ONO.   Preventative: COLONOSCOPY 02/2018 - hemorrhoids, WNL (Outlaw)  Well woman with OBGYN Dr Kenton Kingfisher last seen 05/2019 normal pap smear  Breast cancer screening - no fmhx. No concerns on home breast exams. Will start mammograms age 51.  Flu shot - yearly Tdap 03/2011 Pneumonia shot - not due Shingrix - not due [place] 04/2019, 05/2019 Seat belt use discussed Sunscreen use discussed. No changing moles on skin. Dentist - yearly Eye exam - yearly Non smoker Alcohol - 1 glass of wine per week  Caffeine: seldom Lives with daughter (2006), no pets  Occupation: Tour manager  Edu: bachelor's  Activity: treadmill and workout tapes  Diet: seldom water, fruits/vegetables daily, red meat rare, fish 3x/wk     Relevant past medical, surgical, family and social history reviewed and updated as indicated. Interim medical history since our last visit reviewed. Allergies and medications  reviewed and updated. Outpatient Medications Prior to Visit  Medication Sig Dispense Refill  . albuterol (PROAIR HFA) 108 (90 Base) MCG/ACT inhaler Inhale 2 puffs into the lungs every 4 (four) hours as needed. 1 Inhaler 1  . diltiazem (CARDIZEM CD) 120 MG 24 hr capsule Take 1 capsule (120 mg total) by mouth daily. 90 capsule 3  . DRYSOL 20 % external solution Apply topically as needed.     Marland Kitchen EPINEPHrine (AUVI-Q) 0.3 mg/0.3 mL IJ SOAJ injection Use as directed for severe allergic reaction 2 Device 1  . omeprazole (PRILOSEC) 40 MG capsule Take 1 capsule (40 mg total) by mouth daily as needed (heartburn). 30 capsule 3  . XHANCE 93 MCG/ACT EXHU BLOW 2 DOSES IN EACH NOSTRIL TWICE DAILY 16 mL 0  . Promethazine HCl 6.25 MG/5ML SOLN      No facility-administered medications prior to visit.     Per HPI unless specifically indicated in ROS section below Review of Systems  Constitutional: Negative for activity change, appetite change, chills, fatigue, fever and unexpected weight change.  HENT: Negative for hearing loss.   Eyes: Negative for visual disturbance.  Respiratory: Negative for cough, chest tightness, shortness of breath and wheezing.   Cardiovascular: Negative for chest pain, palpitations and leg swelling.  Gastrointestinal: Negative for abdominal distention, abdominal pain, blood in stool, constipation, diarrhea, nausea and vomiting.  Genitourinary: Negative for difficulty urinating and hematuria.  Musculoskeletal: Negative for  arthralgias, myalgias and neck pain.  Skin: Negative for rash.  Neurological: Negative for dizziness, seizures, syncope and headaches.  Hematological: Negative for adenopathy. Does not bruise/bleed easily.  Psychiatric/Behavioral: Negative for dysphoric mood. The patient is not nervous/anxious.    Objective:  BP 112/68 (BP Location: Left Arm, Patient Position: Sitting, Cuff Size: Normal)   Pulse 73   Temp 98 F (36.7 C) (Temporal)   Resp 16   Ht 5' 2.5"  (1.588 m)   Wt 185 lb 5 oz (84.1 kg)   LMP 12/19/2019   SpO2 98%   BMI 33.35 kg/m   Wt Readings from Last 3 Encounters:  01/08/20 185 lb 5 oz (84.1 kg)  12/17/19 181 lb (82.1 kg)  11/24/19 181 lb (82.1 kg)      Physical Exam Vitals and nursing note reviewed.  Constitutional:      General: She is not in acute distress.    Appearance: Normal appearance. She is well-developed. She is not ill-appearing.  HENT:     Head: Normocephalic and atraumatic.     Right Ear: Hearing, tympanic membrane, ear canal and external ear normal.     Left Ear: Hearing, tympanic membrane, ear canal and external ear normal.  Eyes:     General: No scleral icterus.    Extraocular Movements: Extraocular movements intact.     Conjunctiva/sclera: Conjunctivae normal.     Pupils: Pupils are equal, round, and reactive to light.  Neck:     Thyroid: No thyroid mass or thyromegaly.  Cardiovascular:     Rate and Rhythm: Normal rate and regular rhythm.     Pulses: Normal pulses.          Radial pulses are 2+ on the right side and 2+ on the left side.     Heart sounds: Normal heart sounds. No murmur heard.   Pulmonary:     Effort: Pulmonary effort is normal. No respiratory distress.     Breath sounds: Normal breath sounds. No wheezing, rhonchi or rales.  Abdominal:     General: Abdomen is flat. Bowel sounds are normal. There is no distension.     Palpations: Abdomen is soft. There is no mass.     Tenderness: There is no abdominal tenderness. There is no guarding or rebound.     Hernia: No hernia is present.  Musculoskeletal:        General: Normal range of motion.     Cervical back: Normal range of motion and neck supple.     Right lower leg: No edema.     Left lower leg: No edema.  Lymphadenopathy:     Cervical: No cervical adenopathy.  Skin:    General: Skin is warm and dry.     Findings: No rash.  Neurological:     General: No focal deficit present.     Mental Status: She is alert and oriented to  person, place, and time.     Comments: CN grossly intact, station and gait intact  Psychiatric:        Mood and Affect: Mood normal.        Behavior: Behavior normal.        Thought Content: Thought content normal.        Judgment: Judgment normal.       Results for orders placed or performed in visit on 10/27/19  ECHOCARDIOGRAM COMPLETE  Result Value Ref Range   AR max vel 2.86 cm2   AV Peak grad 5.2 mmHg   Ao pk  vel 1.14 m/s   S' Lateral 3.30 cm   Area-P 1/2 5.54 cm2   AV Area VTI 2.87 cm2   AV Mean grad 3.0 mmHg   Single Plane A4C EF 53.9 %   Single Plane A2C EF 57.7 %   Calc EF 56.3 %   AV Area mean vel 2.73 cm2   Lab Results  Component Value Date   TSH 2.080 06/19/2019   Depression screen [facility] 2/9 01/08/2020 03/25/2019 11/01/2016  Decreased Interest 1 2 0  Down, Depressed, Hopeless 0 1 -  PHQ - 2 Score 1 3 0  Altered sleeping 0 1 -  Tired, decreased energy 1 2 -  Change in appetite 1 0 -  Feeling bad or failure about yourself  0 1 -  Trouble concentrating 0 0 -  Moving slowly or fidgety/restless 0 0 -  Suicidal thoughts 0 0 -  PHQ-9 Score 3 7 -    GAD 7 : Generalized Anxiety Score 01/08/2020 03/25/2019  Nervous, Anxious, on Edge 0 1  Control/stop worrying 0 1  Worry too much - different things 0 1  Trouble relaxing 0 1  Restless 0 0  Easily annoyed or irritable 1 2  Afraid - awful might happen 0 0  Total GAD 7 Score 1 6   Assessment & Plan:  This visit occurred during the SARS-CoV-2 public health emergency.  Safety protocols were in place, including screening questions prior to the visit, additional usage of staff PPE, and extensive cleaning of exam room while observing appropriate contact time as indicated for disinfecting solutions.   Problem List Items Addressed This Visit    Palpitations    Appreciate cards care. S/p reassuring eval (echo, zio patch)       Multiple food allergies    Carries epi pen.       Mild intermittent asthma without  complication    Managed with PRN albuterol inhaler. Has seen allergist.       Medical certificate issuance    Nursing program forms filled out. PPD placed today. Flu shot today. Titers will be drawn when she returns fasting for labs.       Immunization deficiency    She will check at home on childhood immunization records and bring [place] copy if available. Update deficient titers.       Relevant Orders   Hepatitis B surface antibody,qualitative   Mumps antibody, IgG   Health care maintenance - Primary    Preventative protocols reviewed and updated unless pt declined. Discussed healthy diet and lifestyle.       GERD (gastroesophageal reflux disease)    Managed with PRN omeprazole.       Relevant Orders   Comprehensive metabolic panel   Essential hypertension    Stable period on low dose diltiazem.       Other Visit Diagnoses    Need for influenza vaccination       Relevant Orders   Flu Vaccine QUAD 36+ mos IM (Completed)   Encounter for PPD test       Relevant Orders   TB Skin Test (Completed)   Lipid screening       Relevant Orders   Lipid panel       No orders of the defined types were placed in this encounter.  Orders Placed This Encounter  Procedures  . Flu Vaccine QUAD 36+ mos IM  . Lipid panel    Standing Status:   Future    Standing Expiration Date:  01/07/2021  . Comprehensive metabolic panel    Standing Status:   Future    Standing Expiration Date:   01/07/2021  . Hepatitis B surface antibody,qualitative    Standing Status:   Future    Standing Expiration Date:   01/07/2021  . Mumps antibody, IgG    Standing Status:   Future    Standing Expiration Date:   01/07/2021  . TB Skin Test    Order Specific Question:   Has patient ever tested positive?    Answer:   No    Patient instructions: 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  Return at your convenience for fasting labs. We will check immunization titers with  the upcoming blood work.  Check at home on childhood immunization records and bring [place] copy if available.  Return as needed or in 1 year for next physical   Follow up plan: Return in about 1 year (around 01/07/2021), or if symptoms worsen or fail to improve, for annual exam, prior fasting for blood work.  Ria Bush, MD

## 2020-01-08 NOTE — Assessment & Plan Note (Signed)
Stable period on low dose diltiazem.

## 2020-01-08 NOTE — Assessment & Plan Note (Addendum)
Managed with PRN albuterol inhaler. Has seen allergist.

## 2020-01-08 NOTE — Assessment & Plan Note (Signed)
She will check at home on childhood immunization records and bring [place] copy if available. Update deficient titers.

## 2020-01-08 NOTE — Assessment & Plan Note (Signed)
Carries epi pen.

## 2020-01-08 NOTE — Patient Instructions (Addendum)
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  Return at your convenience for fasting labs. We will check immunization titers with the upcoming blood work.  Check at home on childhood immunization records and bring [place] copy if available.  Return as needed or in 1 year for next physical   Health Maintenance, Female Adopting a healthy lifestyle and getting preventive care are important in promoting health and wellness. Ask your health care provider about:  The right schedule for you to have regular tests and exams.  Things you can do on your own to prevent diseases and keep yourself healthy. What should I know about diet, weight, and exercise? Eat a healthy diet   Eat a diet that includes plenty of vegetables, fruits, low-fat dairy products, and lean protein.  Do not eat a lot of foods that are high in solid fats, added sugars, or sodium. Maintain a healthy weight Body mass index (BMI) is used to identify weight problems. It estimates body fat based on height and weight. Your health care provider can help determine your BMI and help you achieve or maintain a healthy weight. Get regular exercise Get regular exercise. This is one of the most important things you can do for your health. Most adults should:  Exercise for at least 150 minutes each week. The exercise should increase your heart rate and make you sweat (moderate-intensity exercise).  Do strengthening exercises at least twice a week. This is in addition to the moderate-intensity exercise.  Spend less time sitting. Even light physical activity can be beneficial. Watch cholesterol and blood lipids Have your blood tested for lipids and cholesterol at 38 years of age, then have this test every 5 years. Have your cholesterol levels checked more often if:  Your lipid or cholesterol levels are high.  You are older than 38 years of age.  You are at high risk for heart disease. What should I know about  cancer screening? Depending on your health history and family history, you may need to have cancer screening at various ages. This may include screening for:  Breast cancer.  Cervical cancer.  Colorectal cancer.  Skin cancer.  Lung cancer. What should I know about heart disease, diabetes, and high blood pressure? Blood pressure and heart disease  High blood pressure causes heart disease and increases the risk of stroke. This is more likely to develop in people who have high blood pressure readings, are of African descent, or are overweight.  Have your blood pressure checked: ? Every 3-5 years if you are 97-80 years of age. ? Every year if you are 55 years old or older. Diabetes Have regular diabetes screenings. This checks your fasting blood sugar level. Have the screening done:  Once every three years after age 95 if you are at a normal weight and have a low risk for diabetes.  More often and at a younger age if you are overweight or have a high risk for diabetes. What should I know about preventing infection? Hepatitis B If you have a higher risk for hepatitis B, you should be screened for this virus. Talk with your health care provider to find out if you are at risk for hepatitis B infection. Hepatitis C Testing is recommended for:  Everyone born from [birth year] through 1964.  Anyone with known risk factors for hepatitis C. Sexually transmitted infections (STIs)  Get screened for STIs, including gonorrhea and chlamydia, if: ? You are sexually active and are younger  than 38 years of age. ? You are older than 38 years of age and your health care provider tells you that you are at risk for this type of infection. ? Your sexual activity has changed since you were last screened, and you are at increased risk for chlamydia or gonorrhea. Ask your health care provider if you are at risk.  Ask your health care provider about whether you are at high risk for HIV. Your health care  provider may recommend a prescription medicine to help prevent HIV infection. If you choose to take medicine to prevent HIV, you should first get tested for HIV. You should then be tested every 3 months for as long as you are taking the medicine. Pregnancy  If you are about to stop having your period (premenopausal) and you may become pregnant, seek counseling before you get pregnant.  Take 400 to 800 micrograms (mcg) of folic acid every day if you become pregnant.  Ask for birth control (contraception) if you want to prevent pregnancy. Osteoporosis and menopause Osteoporosis is a disease in which the bones lose minerals and strength with aging. This can result in bone fractures. If you are 59 years old or older, or if you are at risk for osteoporosis and fractures, ask your health care provider if you should:  Be screened for bone loss.  Take a calcium or vitamin D supplement to lower your risk of fractures.  Be given hormone replacement therapy (HRT) to treat symptoms of menopause. Follow these instructions at home: Lifestyle  Do not use any products that contain nicotine or tobacco, such as cigarettes, e-cigarettes, and chewing tobacco. If you need help quitting, ask your health care provider.  Do not use street drugs.  Do not share needles.  Ask your health care provider for help if you need support or information about quitting drugs. Alcohol use  Do not drink alcohol if: ? Your health care provider tells you not to drink. ? You are pregnant, may be pregnant, or are planning to become pregnant.  If you drink alcohol: ? Limit how much you use to 0-1 drink a day. ? Limit intake if you are breastfeeding.  Be aware of how much alcohol is in your drink. In the [place] one drink equals one 12 oz bottle of beer (355 mL), one 5 oz glass of wine (148 mL), or one 1 oz glass of hard liquor (44 mL). General instructions  Schedule regular health, dental, and eye exams.  Stay current  with your vaccines.  Tell your health care provider if: ? You often feel depressed. ? You have ever been abused or do not feel safe at home. Summary  Adopting a healthy lifestyle and getting preventive care are important in promoting health and wellness.  Follow your health care provider's instructions about healthy diet, exercising, and getting tested or screened for diseases.  Follow your health care provider's instructions on monitoring your cholesterol and blood pressure. This information is not intended to replace advice given to you by your health care provider. Make sure you discuss any questions you have with your health care provider. Document Revised: 02/05/2018 Document Reviewed: 02/05/2018 Elsevier Patient Education  2020 [employer].

## 2020-01-10 NOTE — Progress Notes (Signed)
Cardiology Office Note    Date:  01/11/2020   ID:  Sabrina Smith, DOB 1980/12/26, MRN [id number]  PCP:  Ria Bush, MD  Cardiologist:  Nelva Bush, MD  Electrophysiologist:  None   Chief Complaint: Follow up  History of Present Illness:   Sabrina Smith is a 39 y.o. female with history of HTN, palpitations, asthma, and GERD who presents for follow up of palpitations.   She was seen by Dr, End as a new patient for evaluation of palpitations on 09/30/2019. She had reported a 1-year history of palpitations, most notable when she was still.  She also noted atypical chest pain. Recent EKG with her PCP's office interpreted as sinus bradycardia with frequent PACs. EKG at her visit with Dr. Saunders Revel showed sinus rhythm with frequent premature junctional beats, borderline LVH, and poor R wave progression. There was no significant change when compared to study performed in her PCP's office. Echo showed an EF of 55-60%, no RWMA, normal LV diastolic function parameters, normal RVSF and ventricular cavity size, mild mitral regurgitation, and normal PASP. She was seen in follow up by Marrianne Mood, PA-C on 11/10/2019 and had decreased her caffeine intake, though continued to note palpitations. In this setting, she was started on Cardizem CD 120 mg. Beta blocker was deferred in the context of significant underlying allergies, for which she carried an EpiPen. With regards to fatigue, she was referred to pulmonology for sleep study.    She was last seen in the office on 11/24/2019, and was feeling about the same, not noticing any significant change in her palpitation burden or symptoms with the addition of Cardizem CD.  Outside of significant increased stress at work there were no new lifestyle changes.  To further characterize her palpitation burden Zio patch was placed which demonstrated a predominant rhythm of sinus with an average heart rate of 84 bpm (range 55 to 162 bpm), rare PACs, no sustained  arrhythmias or prolonged pauses, patient triggered events corresponded to sinus rhythm with PACs.  She comes in doing well from a cardiac perspective.  Since she was last seen she notes a decrease in her palpitation burden.  She does continue to note these occasionally, more so when laying down at nighttime resting.  No chest pain, dyspnea, dizziness, presyncope, syncope, or lower extremity swelling.  She continues to minimize caffeine intake.  Work remains stressful.   Labs independently reviewed: 09/2019 - magnesium 2.1, BUN 10, SCr 0.73, potassium 4.0, albumin 4.6, AST/ALT normal 05/2019 - TSH normal, HGB 14.7, PLT 286    Past Medical History:  Diagnosis Date   Allergic rhinitis    severe ([facility])   Asthma    as a child   History of chicken pox    Hypertension    Shingles 1990s    Past Surgical History:  Procedure Laterality Date   COLONOSCOPY  02/2018   hemorrhoids, WNL (Out[facility])    Current Medications: Current Meds  Medication Sig   albuterol (PROAIR HFA) 108 (90 Base) MCG/ACT inhaler Inhale 2 puffs into the lungs every 4 (four) hours as needed.   diltiazem (CARDIZEM CD) 120 MG 24 hr capsule Take 1 capsule (120 mg total) by mouth daily.   DRYSOL 20 % external solution Apply topically as needed.    EPINEPHrine (AUVI-Q) 0.3 mg/0.3 mL IJ SOAJ injection Use as directed for severe allergic reaction   omeprazole (PRILOSEC) 40 MG capsule Take 1 capsule (40 mg total) by mouth daily as needed (heartburn).  XHANCE 93 MCG/ACT EXHU BLOW 2 DOSES IN EACH NOSTRIL TWICE DAILY    Allergies:   Other and Sulfa drugs cross reactors   Social History   Socioeconomic History   Marital status: Single    Spouse name: Not on file   Number of children: Not on file   Years of education: Not on file   Highest education level: Not on file  Occupational History   Occupation: Chemical engineer: [employer]  Tobacco Use   Smoking status: Former Smoker     Years: 2.00    Types: Cigarettes    Quit date: 2006    Years since quitting: 15.8   Smokeless tobacco: Never Used   Tobacco comment: 1cig daily  Vaping Use   Vaping Use: Never used  Substance and Sexual Activity   Alcohol use: Yes    Alcohol/week: 1.0 standard drink    Types: 1 Glasses of wine per week    Comment: occassionally   Drug use: No   Sexual activity: Yes    Birth control/protection: None  Other Topics Concern   Not on file  Social History Narrative   Caffeine: none   Lives with daughter (2006), no pets   Occupation: Tour manager   Edu: bachelor's   Activity: treadmill and workout tapes   Diet: seldom water, fruits/vegetables daily, red meat rare, fish 3x/wk   Social Determinants of Radio broadcast assistant Strain:    Difficulty of Paying Living Expenses: Not on file  Food Insecurity:    Worried About Charity fundraiser in the Last Year: Not on file   [employer] of Food in the Last Year: Not on file  Transportation Needs:    Lack of Transportation (Medical): Not on file   Lack of Transportation (Non-Medical): Not on file  Physical Activity:    Days of Exercise per Week: Not on file   Minutes of Exercise per Session: Not on file  Stress:    Feeling of Stress : Not on file  Social Connections:    Frequency of Communication with Friends and Family: Not on file   Frequency of Social Gatherings with Friends and Family: Not on file   Attends Religious Services: Not on file   Active Member of Clubs or Organizations: Not on file   Attends Archivist Meetings: Not on file   Marital Status: Not on file     Family History:  The patient's family history includes Asthma in her daughter; Cancer in her maternal grandfather; Diabetes in her maternal aunt, maternal grandfather, and maternal uncle; Eczema in her daughter; Heart failure in her cousin and maternal grandmother; Hypertension in her brother, maternal aunt, maternal uncle, and  mother; Stroke in her maternal uncle. There is no history of Allergic rhinitis or Urticaria.  ROS:   Review of Systems  Constitutional: Negative for chills, diaphoresis, fever, malaise/fatigue and weight loss.  HENT: Negative for congestion.   Eyes: Negative for discharge and redness.  Respiratory: Negative for cough, sputum production, shortness of breath and wheezing.   Cardiovascular: Positive for palpitations. Negative for chest pain, orthopnea, claudication, leg swelling and PND.  Gastrointestinal: Negative for abdominal pain, heartburn, nausea and vomiting.  Musculoskeletal: Negative for falls and myalgias.  Skin: Negative for rash.  Neurological: Negative for dizziness, tingling, tremors, sensory change, speech change, focal weakness, loss of consciousness and weakness.  Endo/Heme/Allergies: Does not bruise/bleed easily.  Psychiatric/Behavioral: Negative for substance abuse. The patient is not nervous/anxious.  All other systems reviewed and are negative.    EKGs/Labs/Other Studies Reviewed:    Studies reviewed were summarized above. The additional studies were reviewed today:  2D echo 09/2019: 1. Left ventricular ejection fraction, by estimation, is 55 to 60%. The  left ventricle has normal function. The left ventricle has no regional  wall motion abnormalities. Left ventricular diastolic parameters were  normal.  2. Right ventricular systolic function is normal. The right ventricular  size is normal. There is normal pulmonary artery systolic pressure. The  estimated right ventricular systolic pressure is 17.5 mmHg.  3. Mild mitral valve regurgitation.  __________  Elwyn Reach patch 10/2019:   The predominant rhythm was sinus with an average rate of 84 bpm (range 55-162 bpm).  There were rare PAC's.  No sustained arrhythmia or prolonged pause occurred.  Patient triggered events correspond to sinus rhythm and PAC's.   Predominantly sinus rhythm with rare PAC's.  No  significant arrhythmia observed.    EKG:  EKG is ordered today.  The EKG ordered today demonstrates NSR, 77 bpm, no acute ST-T changes  Recent Labs: 06/19/2019: Hemoglobin 14.7; Platelets 286; TSH 2.080 09/30/2019: ALT 14; BUN 10; Creatinine, Ser 0.73; Magnesium 2.1; Potassium 4.0; Sodium 142  Recent Lipid Panel    Component Value Date/Time   CHOL 146 12/15/2013 0818   TRIG 39.0 12/15/2013 0818   HDL 39.80 12/15/2013 0818   CHOLHDL 4 12/15/2013 0818   VLDL 7.8 12/15/2013 0818   LDLCALC 98 12/15/2013 0818    PHYSICAL EXAM:    VS:  BP 116/72 (BP Location: Left Arm, Patient Position: Sitting, Cuff Size: Normal)    Pulse 77    Ht 5\' 2"  (1.575 m)    Wt 185 lb (83.9 kg)    LMP 12/19/2019    SpO2 98%    BMI 33.84 kg/m   BMI: Body mass index is 33.84 kg/m.  Physical Exam Constitutional:      Appearance: She is well-developed.  HENT:     Head: Normocephalic and atraumatic.  Eyes:     General:        Right eye: No discharge.        Left eye: No discharge.  Neck:     Vascular: No JVD.  Cardiovascular:     Rate and Rhythm: Normal rate and regular rhythm.     Pulses: No midsystolic click and no opening snap.          Posterior tibial pulses are 2+ on the right side and 2+ on the left side.     Heart sounds: Normal heart sounds, S1 normal and S2 normal. Heart sounds not distant. No murmur heard.  No friction rub.  Pulmonary:     Effort: Pulmonary effort is normal. No respiratory distress.     Breath sounds: Normal breath sounds. No decreased breath sounds, wheezing or rales.  Chest:     Chest wall: No tenderness.  Abdominal:     General: There is no distension.     Palpations: Abdomen is soft.     Tenderness: There is no abdominal tenderness.  Musculoskeletal:     Cervical back: Normal range of motion.  Skin:    General: Skin is warm and dry.     Nails: There is no clubbing.  Neurological:     Mental Status: She is alert and oriented to person, place, and time.  Psychiatric:         Speech: Speech normal.        Behavior:  Behavior normal.        Thought Content: Thought content normal.        Judgment: Judgment normal.     Wt Readings from Last 3 Encounters:  01/11/20 185 lb (83.9 kg)  01/08/20 185 lb 5 oz (84.1 kg)  12/17/19 181 lb (82.1 kg)     ASSESSMENT & PLAN:   1. Palpitations/PACs: Symptoms have improved somewhat since her last visit.  Recent Zio patch demonstrated sinus rhythm with rare PACs with no significant arrhythmias.  Continue Cardizem CD 120 mg daily.  Okay to have 1 cup of coffee daily.  Try to minimize alcohol intake.  2. HTN: Blood pressure well controlled.  Continue Cardizem CD as outlined above.   Disposition: F/u with Dr. Saunders Revel or an APP in 6 months.   Medication Adjustments/Labs and Tests Ordered: Current medicines are reviewed at length with the patient today.  Concerns regarding medicines are outlined above. Medication changes, Labs and Tests ordered today are summarized above and listed in the Patient Instructions accessible in Encounters.   Signed, Christell Faith, PA-C 01/11/2020 1:52 PM     [address] [place], [place] [address] [phone]

## 2020-01-11 ENCOUNTER — Ambulatory Visit: Payer: Federal, State, Local not specified - PPO | Admitting: Physician Assistant

## 2020-01-11 ENCOUNTER — Other Ambulatory Visit (INDEPENDENT_AMBULATORY_CARE_PROVIDER_SITE_OTHER): Payer: Federal, State, Local not specified - PPO

## 2020-01-11 ENCOUNTER — Other Ambulatory Visit: Payer: Self-pay

## 2020-01-11 ENCOUNTER — Encounter: Payer: Self-pay | Admitting: Physician Assistant

## 2020-01-11 VITALS — BP 116/72 | HR 77 | Ht 62.0 in | Wt 185.0 lb

## 2020-01-11 DIAGNOSIS — Z13220 Encounter for screening for lipoid disorders: Secondary | ICD-10-CM

## 2020-01-11 DIAGNOSIS — R002 Palpitations: Secondary | ICD-10-CM

## 2020-01-11 DIAGNOSIS — I10 Essential (primary) hypertension: Secondary | ICD-10-CM

## 2020-01-11 DIAGNOSIS — K219 Gastro-esophageal reflux disease without esophagitis: Secondary | ICD-10-CM

## 2020-01-11 DIAGNOSIS — Z2839 Other underimmunization status: Secondary | ICD-10-CM

## 2020-01-11 DIAGNOSIS — I491 Atrial premature depolarization: Secondary | ICD-10-CM

## 2020-01-11 DIAGNOSIS — Z283 Underimmunization status: Secondary | ICD-10-CM | POA: Diagnosis not present

## 2020-01-11 LAB — TB SKIN TEST
Induration: 0 mm
TB Skin Test: NEGATIVE

## 2020-01-11 NOTE — Patient Instructions (Signed)
Medication Instructions:  Your physician recommends that you continue on your current medications as directed. Please refer to the Current Medication list given to you today. *If you need a refill on your cardiac medications before your next appointment, please call your pharmacy*  Lab Work: None ordered  If you have labs (blood work) drawn today and your tests are completely normal, you will receive your results only by: Marland Kitchen MyChart Message (if you have MyChart) OR . A paper copy in the mail If you have any lab test that is abnormal or we need to change your treatment, we will call you to review the results.   Testing/Procedures: None ordered   Follow-Up: At [facility], you and your health needs are our priority.  As part of our continuing mission to provide you with exceptional heart care, we have created designated Provider Care Teams.  These Care Teams include your primary Cardiologist (physician) and Advanced Practice Providers (APPs -  Physician Assistants and Nurse Practitioners) who all work together to provide you with the care you need, when you need it.  We recommend signing up for the patient portal called "MyChart".  Sign up information is provided on this After Visit Summary.  MyChart is used to connect with patients for Virtual Visits (Telemedicine).  Patients are able to view lab/test results, encounter notes, upcoming appointments, etc.  Non-urgent messages can be sent to your provider as well.   To learn more about what you can do with MyChart, go to [email or web address].    Your next appointment:   6 month(s)  The format for your next appointment:   In Person  Provider:   You may see Nelva Bush, MD or one of the following Advanced Practice Providers on your designated Care Team:    Murray Hodgkins, NP  Christell Faith, PA-C  Marrianne Mood, PA-C  Cadence Turlock, [place]  Laurann Montana, NP

## 2020-01-12 DIAGNOSIS — F320 Major depressive disorder, single episode, mild: Secondary | ICD-10-CM | POA: Diagnosis not present

## 2020-01-12 LAB — COMPREHENSIVE METABOLIC PANEL
ALT: 14 U/L (ref 0–35)
AST: 15 U/L (ref 0–37)
Albumin: 4.4 g/dL (ref 3.5–5.2)
Alkaline Phosphatase: 55 U/L (ref 39–117)
BUN: 12 mg/dL (ref 6–23)
CO2: 26 mEq/L (ref 19–32)
Calcium: 9.1 mg/dL (ref 8.4–10.5)
Chloride: 102 mEq/L (ref 96–112)
Creatinine, Ser: 0.72 mg/dL (ref 0.40–1.20)
GFR: 105.78 mL/min (ref >60.00)
Glucose, Bld: 88 mg/dL (ref 70–99)
Potassium: 4 mEq/L (ref 3.5–5.1)
Sodium: 136 mEq/L (ref 135–145)
Total Bilirubin: 0.5 mg/dL (ref 0.2–1.2)
Total Protein: 7.3 g/dL (ref 6.0–8.3)

## 2020-01-12 LAB — LIPID PANEL
Cholesterol: 192 mg/dL (ref 0–200)
HDL: 54.1 mg/dL (ref >39.00)
LDL Cholesterol: 121 mg/dL — ABNORMAL HIGH (ref 0–99)
NonHDL: 137.58
Total CHOL/HDL Ratio: 4
Triglycerides: 82 mg/dL (ref 0.0–149.0)
VLDL: 16.4 mg/dL (ref 0.0–40.0)

## 2020-01-12 LAB — HEPATITIS B SURFACE ANTIBODY,QUALITATIVE: Hep B S Ab: NONREACTIVE

## 2020-01-12 LAB — MUMPS ANTIBODY, IGG: Mumps IgG: <9 AU/mL — ABNORMAL LOW

## 2020-01-26 ENCOUNTER — Ambulatory Visit: Payer: Federal, State, Local not specified - PPO

## 2020-01-26 ENCOUNTER — Other Ambulatory Visit: Payer: Self-pay

## 2020-01-26 DIAGNOSIS — F320 Major depressive disorder, single episode, mild: Secondary | ICD-10-CM | POA: Diagnosis not present

## 2020-01-27 DIAGNOSIS — M67834 Other specified disorders of tendon, left wrist: Secondary | ICD-10-CM | POA: Diagnosis not present

## 2020-01-28 ENCOUNTER — Ambulatory Visit (INDEPENDENT_AMBULATORY_CARE_PROVIDER_SITE_OTHER): Payer: Federal, State, Local not specified - PPO | Admitting: Adult Health

## 2020-01-28 ENCOUNTER — Other Ambulatory Visit: Payer: Self-pay

## 2020-01-28 ENCOUNTER — Encounter: Payer: Self-pay | Admitting: Adult Health

## 2020-01-28 DIAGNOSIS — G4719 Other hypersomnia: Secondary | ICD-10-CM

## 2020-01-28 NOTE — Patient Instructions (Signed)
Set up overnight oximetry test on room air.-I will call you with those test results. Healthy sleep regimen  Do not drive if sleepy  Avoid sedating medications as able Follow-up with our office as needed  And As needed

## 2020-01-28 NOTE — Progress Notes (Signed)
Virtual Visit via Telephone Note  I connected with Jeanann Lewandowsky on 01/28/20 at  4:30 PM EST by telephone and verified that I am speaking with the correct person using two identifiers.  Location: Patient: Home  Provider: Office    I discussed the limitations, risks, security and privacy concerns of performing an evaluation and management service by telephone and the availability of in person appointments. I also discussed with the patient that there may be a patient responsible charge related to this service. The patient expressed understanding and agreed to proceed.   History of Present Illness: 39 year old female seen for sleep consult December 17, 2019 for daytime sleepiness restless sleep and snoring.  Patient was set up for a home sleep study that did not show any significant sleep apnea.  Home sleep study showed AHI 3.9/hour.  And SPO2 low at 86%.  Patient was set up for an overnight oximetry test on room air.  Unfortunately this has not been completed.  We are contacting the homecare company to have this completed.  Patient says overall she is doing well she does have a very early shift at the [employer] office where she has to be there at 4 AM she feels that this contributes to some of her daytime sleepiness.  She is not taking any sedating medications. Patient education on healthy sleep regimen  Past Medical History:  Diagnosis Date  . Allergic rhinitis    severe ([facility])  . Asthma    as a child  . History of chicken pox   . Hypertension   . Shingles 1990s    Current Outpatient Medications on File Prior to Visit  Medication Sig Dispense Refill  . albuterol (PROAIR HFA) 108 (90 Base) MCG/ACT inhaler Inhale 2 puffs into the lungs every 4 (four) hours as needed. 1 Inhaler 1  . diltiazem (CARDIZEM CD) 120 MG 24 hr capsule Take 1 capsule (120 mg total) by mouth daily. 90 capsule 3  . DRYSOL 20 % external solution Apply topically as needed.     Marland Kitchen EPINEPHrine (AUVI-Q) 0.3 mg/0.3 mL IJ  SOAJ injection Use as directed for severe allergic reaction 2 Device 1  . omeprazole (PRILOSEC) 40 MG capsule Take 1 capsule (40 mg total) by mouth daily as needed (heartburn). 30 capsule 3  . XHANCE 93 MCG/ACT EXHU BLOW 2 DOSES IN EACH NOSTRIL TWICE DAILY 16 mL 0   No current facility-administered medications on file prior to visit.      Observations/Objective: Sounds well with no audible distress  Assessment and Plan: Daytime sleepiness possibly from lack of sleep.  Patient advised on healthy sleep regimen.  Home sleep study was negative for sleep apnea.  Home sleep study did show some mild nocturnal hypoxemia.  Nocturnal hypoxemia.-needs ONO on room air   Plan  Patient Instructions  Set up overnight oximetry test on room air.-I will call you with those test results. Healthy sleep regimen  Do not drive if sleepy  Avoid sedating medications as able Follow-up with our office as needed  And As needed        Follow Up Instructions:    I discussed the assessment and treatment plan with the patient. The patient was provided an opportunity to ask questions and all were answered. The patient agreed with the plan and demonstrated an understanding of the instructions.   The patient was advised to call back or seek an in-person evaluation if the symptoms worsen or if the condition fails to improve as anticipated.  I provided 15  minutes of non-face-to-face time during this encounter.   Rexene Edison, NP

## 2020-01-29 NOTE — Progress Notes (Signed)
Reviewed and agree with assessment/plan.   Chesley Mires, MD [facility] Pulmonary/Critical Care 01/29/2020, 8:40 AM Pager:  [phone]

## 2020-02-02 ENCOUNTER — Ambulatory Visit (INDEPENDENT_AMBULATORY_CARE_PROVIDER_SITE_OTHER): Payer: Federal, State, Local not specified - PPO

## 2020-02-02 ENCOUNTER — Other Ambulatory Visit: Payer: Self-pay

## 2020-02-02 DIAGNOSIS — Z111 Encounter for screening for respiratory tuberculosis: Secondary | ICD-10-CM

## 2020-02-02 DIAGNOSIS — Z23 Encounter for immunization: Secondary | ICD-10-CM

## 2020-02-02 DIAGNOSIS — F320 Major depressive disorder, single episode, mild: Secondary | ICD-10-CM | POA: Diagnosis not present

## 2020-02-02 NOTE — Progress Notes (Signed)
Per orders of Dr. Danise Mina, injection of Hep A/BVaccine, MMR, and TB test, given by Aneta Mins, RN. Patient tolerated injection well.   MMR: R Arm Hep A/B: L Deltoid TB: R Forearm

## 2020-02-03 DIAGNOSIS — Z20822 Contact with and (suspected) exposure to covid-19: Secondary | ICD-10-CM | POA: Diagnosis not present

## 2020-02-04 LAB — TB SKIN TEST
Induration: 0 mm
TB Skin Test: NEGATIVE

## 2020-02-09 DIAGNOSIS — F320 Major depressive disorder, single episode, mild: Secondary | ICD-10-CM | POA: Diagnosis not present

## 2020-02-15 ENCOUNTER — Other Ambulatory Visit: Payer: Self-pay

## 2020-02-16 DIAGNOSIS — F320 Major depressive disorder, single episode, mild: Secondary | ICD-10-CM | POA: Diagnosis not present

## 2020-02-23 ENCOUNTER — Other Ambulatory Visit: Payer: Self-pay

## 2020-02-24 DIAGNOSIS — J069 Acute upper respiratory infection, unspecified: Secondary | ICD-10-CM | POA: Diagnosis not present

## 2020-02-24 DIAGNOSIS — R070 Pain in throat: Secondary | ICD-10-CM | POA: Diagnosis not present

## 2020-02-24 DIAGNOSIS — Z20822 Contact with and (suspected) exposure to covid-19: Secondary | ICD-10-CM | POA: Diagnosis not present

## 2020-02-24 DIAGNOSIS — M791 Myalgia, unspecified site: Secondary | ICD-10-CM | POA: Diagnosis not present

## 2020-03-01 DIAGNOSIS — F320 Major depressive disorder, single episode, mild: Secondary | ICD-10-CM | POA: Diagnosis not present

## 2020-03-10 DIAGNOSIS — F320 Major depressive disorder, single episode, mild: Secondary | ICD-10-CM | POA: Diagnosis not present

## 2020-03-17 DIAGNOSIS — F320 Major depressive disorder, single episode, mild: Secondary | ICD-10-CM | POA: Diagnosis not present

## 2020-03-18 DIAGNOSIS — M25551 Pain in right hip: Secondary | ICD-10-CM | POA: Diagnosis not present

## 2020-03-18 DIAGNOSIS — M533 Sacrococcygeal disorders, not elsewhere classified: Secondary | ICD-10-CM | POA: Diagnosis not present

## 2020-03-18 DIAGNOSIS — M542 Cervicalgia: Secondary | ICD-10-CM | POA: Diagnosis not present

## 2020-03-18 DIAGNOSIS — M791 Myalgia, unspecified site: Secondary | ICD-10-CM | POA: Diagnosis not present

## 2020-03-24 DIAGNOSIS — F320 Major depressive disorder, single episode, mild: Secondary | ICD-10-CM | POA: Diagnosis not present

## 2020-03-25 DIAGNOSIS — M542 Cervicalgia: Secondary | ICD-10-CM | POA: Diagnosis not present

## 2020-03-25 DIAGNOSIS — M791 Myalgia, unspecified site: Secondary | ICD-10-CM | POA: Diagnosis not present

## 2020-03-25 DIAGNOSIS — M533 Sacrococcygeal disorders, not elsewhere classified: Secondary | ICD-10-CM | POA: Diagnosis not present

## 2020-03-25 DIAGNOSIS — M25551 Pain in right hip: Secondary | ICD-10-CM | POA: Diagnosis not present

## 2020-03-30 DIAGNOSIS — F320 Major depressive disorder, single episode, mild: Secondary | ICD-10-CM | POA: Diagnosis not present

## 2020-04-27 DIAGNOSIS — F320 Major depressive disorder, single episode, mild: Secondary | ICD-10-CM | POA: Diagnosis not present

## 2020-05-11 DIAGNOSIS — F320 Major depressive disorder, single episode, mild: Secondary | ICD-10-CM | POA: Diagnosis not present

## 2020-05-25 DIAGNOSIS — F320 Major depressive disorder, single episode, mild: Secondary | ICD-10-CM | POA: Diagnosis not present

## 2020-06-01 ENCOUNTER — Other Ambulatory Visit: Payer: Self-pay

## 2020-06-01 ENCOUNTER — Encounter: Payer: Self-pay | Admitting: Obstetrics and Gynecology

## 2020-06-01 ENCOUNTER — Ambulatory Visit: Payer: Federal, State, Local not specified - PPO | Admitting: Obstetrics and Gynecology

## 2020-06-01 VITALS — BP 100/60 | Ht 62.0 in | Wt 183.0 lb

## 2020-06-01 DIAGNOSIS — R351 Nocturia: Secondary | ICD-10-CM | POA: Diagnosis not present

## 2020-06-01 DIAGNOSIS — R1031 Right lower quadrant pain: Secondary | ICD-10-CM | POA: Diagnosis not present

## 2020-06-01 DIAGNOSIS — R350 Frequency of micturition: Secondary | ICD-10-CM | POA: Diagnosis not present

## 2020-06-01 LAB — POCT URINALYSIS DIPSTICK
Bilirubin, UA: NEGATIVE
Blood, UA: NEGATIVE
Glucose, UA: NEGATIVE
Ketones, UA: NEGATIVE
Leukocytes, UA: NEGATIVE
Nitrite, UA: NEGATIVE
Protein, UA: NEGATIVE
Spec Grav, UA: 1.02 (ref 1.010–1.025)
pH, UA: 5 (ref 5.0–8.0)

## 2020-06-01 NOTE — Patient Instructions (Signed)
I value your feedback and you entrusting us with your care. If you get a [facility] patient survey, I would appreciate you taking the time to let us know about your experience today. Thank you! ? ? ?

## 2020-06-01 NOTE — Progress Notes (Signed)
Ria Bush, MD   Chief Complaint  Patient presents with  . Pelvic Pain    Dull pain on right side only right before cycles starts x couple of months   . Urinary Tract Infection    Frequency at night, feels bladder full no burning x 2 weeks    HPI:      [name] is a 40 y.o. G2P1011 whose LMP was Patient's last menstrual period was 05/13/2020 (approximate)., presents today for RLQ pain for the past few months. Sx are dull and constant, but more bothersome wk before menses. Menses are monthly, lasting 7 days, no BTB, mild dysmen. No hx of ovar cysts. No GI, vag sx.  Pt also with urinary frequency/nocturia usually with good flow for past 2 wks. Drinks 1 caffeinated drink in AM, not much liquid before bed. No dysuria, hematuria, LBP, fevers. Hx of UTIs in past.  She is sex active, no pain/bleeding. Using condoms.  Annual sched 4/22.   Past Medical History:  Diagnosis Date  . Allergic rhinitis    severe ([facility])  . Asthma    as a child  . History of chicken pox   . Hypertension   . Shingles 1990s    Past Surgical History:  Procedure Laterality Date  . COLONOSCOPY  02/2018   hemorrhoids, WNL (Outlaw)    Family History  Problem Relation Age of Onset  . Diabetes Maternal Aunt   . Hypertension Maternal Aunt   . Diabetes Maternal Uncle   . Hypertension Maternal Uncle   . Stroke Maternal Uncle   . Heart failure Maternal Grandmother   . Diabetes Maternal Grandfather   . Cancer Maternal Grandfather        prostate  . Hypertension Mother   . Hypertension Brother   . Asthma Daughter   . Eczema Daughter   . Heart failure Cousin   . Allergic rhinitis Neg Hx   . Urticaria Neg Hx     Social History   Socioeconomic History  . Marital status: Single    Spouse name: Not on file  . Number of children: Not on file  . Years of education: Not on file  . Highest education level: Not on file  Occupational History  . Occupation: Optician, dispensing: [employer]  Tobacco Use  . Smoking status: Former Smoker    Years: 2.00    Types: Cigarettes    Quit date: 2006    Years since quitting: 16.2  . Smokeless tobacco: Never Used  . Tobacco comment: 1cig daily  Vaping Use  . Vaping Use: Never used  Substance and Sexual Activity  . Alcohol use: Yes    Alcohol/week: 1.0 standard drink    Types: 1 Glasses of wine per week    Comment: occassionally  . Drug use: No  . Sexual activity: Yes    Birth control/protection: None, Condom  Other Topics Concern  . Not on file  Social History Narrative   Caffeine: none   Lives with daughter (2006), no pets   Occupation: Tour manager   Edu: bachelor's   Activity: treadmill and workout tapes   Diet: seldom water, fruits/vegetables daily, red meat rare, fish 3x/wk   Social Determinants of Radio broadcast assistant Strain: Not on file  Food Insecurity: Not on file  Transportation Needs: Not on file  Physical Activity: Not on file  Stress: Not on file  Social Connections: Not on file  Intimate Partner  Violence: Not on file    Outpatient Medications Prior to Visit  Medication Sig Dispense Refill  . DRYSOL 20 % external solution Apply topically as needed.     Marland Kitchen omeprazole (PRILOSEC) 40 MG capsule Take 1 capsule (40 mg total) by mouth daily as needed (heartburn). 30 capsule 3  . XHANCE 93 MCG/ACT EXHU BLOW 2 DOSES IN EACH NOSTRIL TWICE DAILY 16 mL 0  . diltiazem (CARDIZEM CD) 120 MG 24 hr capsule Take 1 capsule (120 mg total) by mouth daily. 90 capsule 3  . albuterol (PROAIR HFA) 108 (90 Base) MCG/ACT inhaler Inhale 2 puffs into the lungs every 4 (four) hours as needed. 1 Inhaler 1  . EPINEPHrine (AUVI-Q) 0.3 mg/0.3 mL IJ SOAJ injection Use as directed for severe allergic reaction 2 Device 1   No facility-administered medications prior to visit.      ROS:  Review of Systems  Constitutional: Negative for fever.  Gastrointestinal: Negative for blood in stool,  constipation, diarrhea, nausea and vomiting.  Genitourinary: Positive for frequency and pelvic pain. Negative for dyspareunia, dysuria, flank pain, hematuria, urgency, vaginal bleeding, vaginal discharge and vaginal pain.  Musculoskeletal: Negative for back pain.  Skin: Negative for rash.    OBJECTIVE:   Vitals:  BP 100/60   Ht 5\' 2"  (1.575 m)   Wt 183 lb (83 kg)   LMP 05/13/2020 (Approximate)   BMI 33.47 kg/m   Physical Exam Vitals reviewed.  Constitutional:      Appearance: She is well-developed.  Pulmonary:     Effort: Pulmonary effort is normal.  Genitourinary:    General: Normal vulva.     Pubic Area: No rash.      Labia:        Right: No rash, tenderness or lesion.        Left: No rash, tenderness or lesion.      Vagina: Normal. No vaginal discharge, erythema or tenderness.     Cervix: Normal.     Uterus: Normal. Not enlarged and not tender.      Adnexa: Right adnexa normal and left adnexa normal.       Right: No mass or tenderness.         Left: No mass or tenderness.    Musculoskeletal:        General: Normal range of motion.     Cervical back: Normal range of motion.  Skin:    General: Skin is warm and dry.  Neurological:     General: No focal deficit present.     Mental Status: She is alert and oriented to person, place, and time.  Psychiatric:        Mood and Affect: Mood normal.        Behavior: Behavior normal.        Thought Content: Thought content normal.        Judgment: Judgment normal.     Results: Results for orders placed or performed in visit on 06/01/20 (from the past 24 hour(s))  POCT Urinalysis Dipstick     Status: Normal   Collection Time: 06/01/20  4:12 PM  Result Value Ref Range   Color, UA yellow    Clarity, UA clear    Glucose, UA Negative Negative   Bilirubin, UA neg    Ketones, UA neg    Spec Grav, UA 1.020 1.010 - 1.025   Blood, UA neg    pH, UA 5.0 5.0 - 8.0   Protein, UA Negative Negative   Urobilinogen, UA  Nitrite, UA neg    Leukocytes, UA Negative Negative   Appearance     Odor       Assessment/Plan: Nocturia - Plan: POCT Urinalysis Dipstick, Urine Culture--neg UA, check C&S. Will f/u if pos. If not, d/c fluids 2-3 hrs before bed.   RLQ abdominal pain--Plan: US PELVIC COMPLETE WITH TRANSVAGINAL; check GYN u/s (will be in a few wks due to u/s shortage). Will f/u with results. F/u sooner if sx worsen.     Return if symptoms worsen or fail to improve.  Kelsei Defino B. Koya Hunger, PA-C 06/01/2020 4:16 PM

## 2020-06-07 LAB — URINE CULTURE

## 2020-06-08 MED ORDER — CEPHALEXIN 500 MG PO CAPS: 500.0000 mg | ORAL_CAPSULE | Freq: Two times a day (BID) | ORAL | 0 refills | Status: AC

## 2020-06-08 NOTE — Addendum Note (Signed)
Addended by: Ardeth Perfect B on: 05/02/4598 05:45 PM   Modules accepted: Orders

## 2020-06-22 ENCOUNTER — Ambulatory Visit (INDEPENDENT_AMBULATORY_CARE_PROVIDER_SITE_OTHER): Payer: Federal, State, Local not specified - PPO | Admitting: Obstetrics & Gynecology

## 2020-06-22 ENCOUNTER — Encounter: Payer: Self-pay | Admitting: Obstetrics & Gynecology

## 2020-06-22 ENCOUNTER — Other Ambulatory Visit: Payer: Self-pay

## 2020-06-22 VITALS — BP 102/64 | HR 70 | Ht 62.0 in | Wt 182.0 lb

## 2020-06-22 DIAGNOSIS — R350 Frequency of micturition: Secondary | ICD-10-CM

## 2020-06-22 DIAGNOSIS — M545 Low back pain, unspecified: Secondary | ICD-10-CM

## 2020-06-22 DIAGNOSIS — F320 Major depressive disorder, single episode, mild: Secondary | ICD-10-CM | POA: Diagnosis not present

## 2020-06-22 LAB — POCT URINALYSIS DIPSTICK
Appearance: NORMAL
Bilirubin, UA: NEGATIVE
Glucose, UA: NEGATIVE
Ketones, UA: NEGATIVE
Leukocytes, UA: NEGATIVE
Nitrite, UA: NEGATIVE
Odor: NORMAL
Protein, UA: NEGATIVE
Spec Grav, UA: 1.02 (ref 1.010–1.025)
Urobilinogen, UA: 0.2 E.U./dL
pH, UA: 6 (ref 5.0–8.0)

## 2020-06-22 NOTE — Patient Instructions (Signed)
Thank you for choosing [facility]. As part of our ongoing efforts to improve patient experience, we would appreciate your feedback. Please fill out the short survey that you will receive by mail or MyChart. Your opinion is important to [place]! -Dr Kenton Kingfisher  Transvaginal Ultrasound A transvaginal ultrasound, also called an endovaginal ultrasound, is a test that uses sound waves to take pictures of the female genital tract. The pictures are taken with a device, called a transducer, that is placed in the vagina. This test may be done to:  Check for problems with your pregnancy.  Check your developing baby.  Check for anything abnormal in the uterus or ovaries.  Find out why you have pelvic pain or bleeding. Tell a health care provider about:  Any allergies you have.  All medicines you are taking, including vitamins, herbs, eye drops, creams, and over-the-counter medicines.  Any blood disorders you have.  Any surgeries you have had.  Any medical conditions you have.  Whether you are pregnant or may be pregnant.  Whether you are having your menstrual period. What are the risks? This is a safe procedure. There are no known risks or complications of having this test. What happens before the procedure? This procedure needs to be done when your bladder is empty. Follow your health care provider's instructions about drinking fluids and emptying your bladder before the test. What happens during the procedure?  You will empty your bladder before the procedure.  You will undress from the waist down.  You will lie down on an exam table, with your knees bent and your feet in foot holders.  A health care provider will cover the transducer with a sterile cover.  A gel will be put on the transducer. The gel helps transmit the sound waves and prevents irritation of your vagina.  The technician will insert the transducer into your vagina to get images. These will be displayed on a monitor that  looks like a small television screen.  The transducer will be removed when the procedure is complete. The procedure may vary among health care providers and hospitals.   What happens after the procedure?  It is up to you to get the results of your procedure. Ask your health care provider, or the department that is doing the procedure, when your results will be ready.  Keep all follow-up visits as told by your health care provider. This is important. Summary  A transvaginal ultrasound, also called an endovaginal ultrasound, is a test that uses sound waves to take pictures of the female genital tract.  This is a safe procedure. There are no known risks associated with this test.  The procedure needs to be done when your bladder is empty. Follow your health care provider's instructions about drinking fluids and emptying your bladder before the test.  During the procedure, you will undress from the waist down and lie down on an exam table. A technician will insert a transducer into your vagina to obtain images.  Ask your health care provider, or the department that is doing the procedure, when your results will be ready. This information is not intended to replace advice given to you by your health care provider. Make sure you discuss any questions you have with your health care provider. Document Revised: 09/25/2017 Document Reviewed: 09/25/2017 Elsevier Patient Education  [place].

## 2020-06-22 NOTE — Progress Notes (Signed)
HPI:      [name] is a 40 y.o. G2P1011 who LMP was Patient's last menstrual period was 06/04/2020., she presents today for her annual examination. The patient has no complaints today other than continued RLQ mild pain without radiation and worse w periods, has [place] scheduled next week, periods are still reg and 7 days. The patient is sexually active. Her last pap: approximate date 2021 and was normal. The patient does perform self breast exams.  There is no notable family history of breast or ovarian cancer in her family.  The patient has regular exercise: yes.  The patient denies current symptoms of depression.    GYN History: Contraception: condoms  PMHx: Past Medical History:  Diagnosis Date  . Allergic rhinitis    severe ([place])  . Asthma    as a child  . History of chicken pox   . Hypertension   . Shingles 1990s   Past Surgical History:  Procedure Laterality Date  . COLONOSCOPY  02/2018   hemorrhoids, WNL (Outlaw)   Family History  Problem Relation Age of Onset  . Diabetes Maternal Aunt   . Hypertension Maternal Aunt   . Diabetes Maternal Uncle   . Hypertension Maternal Uncle   . Stroke Maternal Uncle   . Heart failure Maternal Grandmother   . Diabetes Maternal Grandfather   . Cancer Maternal Grandfather        prostate  . Hypertension Mother   . Hypertension Brother   . Asthma Daughter   . Eczema Daughter   . Heart failure Cousin   . Allergic rhinitis Neg Hx   . Urticaria Neg Hx    Social History   Tobacco Use  . Smoking status: Former Smoker    Years: 2.00    Types: Cigarettes    Quit date: 2006    Years since quitting: 16.3  . Smokeless tobacco: Never Used  . Tobacco comment: 1cig daily  Vaping Use  . Vaping Use: Never used  Substance Use Topics  . Alcohol use: Yes    Alcohol/week: 1.0 standard drink    Types: 1 Glasses of wine per week    Comment: occassionally  . Drug use: No    Current Outpatient Medications:  .  DRYSOL 20 %  external solution, Apply topically as needed. , Disp: , Rfl:  .  omeprazole (PRILOSEC) 40 MG capsule, Take 1 capsule (40 mg total) by mouth daily as needed (heartburn)., Disp: 30 capsule, Rfl: 3 .  XHANCE 93 MCG/ACT EXHU, BLOW 2 DOSES IN EACH NOSTRIL TWICE DAILY, Disp: 16 mL, Rfl: 0 .  diltiazem (CARDIZEM CD) 120 MG 24 hr capsule, Take 1 capsule (120 mg total) by mouth daily., Disp: 90 capsule, Rfl: 3 Allergies: Other and Sulfa drugs cross reactors  Review of Systems  Constitutional: Negative for chills, fever and malaise/fatigue.  HENT: Negative for congestion, sinus pain and sore throat.   Eyes: Negative for blurred vision and pain.  Respiratory: Negative for cough and wheezing.   Cardiovascular: Negative for chest pain and leg swelling.  Gastrointestinal: Positive for abdominal pain. Negative for constipation, diarrhea, heartburn, nausea and vomiting.  Genitourinary: Positive for frequency. Negative for dysuria, hematuria and urgency.  Musculoskeletal: Negative for back pain, joint pain, myalgias and neck pain.  Skin: Negative for itching and rash.  Neurological: Negative for dizziness, tremors and weakness.  Endo/Heme/Allergies: Does not bruise/bleed easily.  Psychiatric/Behavioral: Negative for depression. The patient is not nervous/anxious and does not have insomnia.  Objective: BP 102/64 (Cuff Size: Normal)   Pulse 70   Ht 5\' 2"  (1.575 m)   Wt 182 lb (82.6 kg)   LMP 06/04/2020   BMI 33.29 kg/m   Filed Weights   06/22/20 1013  Weight: 182 lb (82.6 kg)   Body mass index is 33.29 kg/m. Physical Exam Constitutional:      General: She is not in acute distress.    Appearance: She is well-developed.  Genitourinary:     Rectum normal.     No lesions in the vagina.     No vaginal bleeding.      Right Adnexa: tender.    Right Adnexa: no mass present.    Left Adnexa: not tender and no mass present.    Adnexa exam comments: Mild R T, no mass.     No cervical motion  tenderness, friability, lesion or polyp.     Uterus is not enlarged.     No uterine mass detected.    Uterus is midaxial.     Pelvic exam was performed with patient in the lithotomy position.  Breasts:     Right: No mass, skin change or tenderness.     Left: No mass, skin change or tenderness.    HENT:     Head: Normocephalic and atraumatic. No laceration.     Right Ear: Hearing normal.     Left Ear: Hearing normal.     Mouth/Throat:     Pharynx: Uvula midline.  Eyes:     Pupils: Pupils are equal, round, and reactive to light.  Neck:     Thyroid: No thyromegaly.  Cardiovascular:     Rate and Rhythm: Normal rate and regular rhythm.     Heart sounds: No murmur heard. No friction rub. No gallop.   Pulmonary:     Effort: Pulmonary effort is normal. No respiratory distress.     Breath sounds: Normal breath sounds. No wheezing.  Abdominal:     General: Bowel sounds are normal. There is no distension.     Palpations: Abdomen is soft.     Tenderness: There is no abdominal tenderness. There is no rebound.  Musculoskeletal:        General: Normal range of motion.     Cervical back: Normal range of motion and neck supple.  Neurological:     Mental Status: She is alert and oriented to person, place, and time.     Cranial Nerves: No cranial nerve deficit.  Skin:    General: Skin is warm and dry.  Psychiatric:        Judgment: Judgment normal.  Vitals reviewed.     Assessment:  ANNUAL EXAM 1. Acute low back pain without sciatica, unspecified back pain laterality   2. Urinary frequency      Screening Plan:            1.  Cervical Screening-  Pap smear schedule reviewed with patient  2. Breast screening- Exam annually and mammogram>40 planned   3. Colonoscopy every 10 years, Hemoccult testing - after age 68  4. Labs managed by PCP  5. Counseling for contraception: condoms  Upstream - 06/22/20 1027      Pregnancy Intention Screening   Does the patient want to become  pregnant in the next year? Unsure    Does the patient's partner want to become pregnant in the next year? Unsure    Would the patient like to discuss contraceptive options today? Yes      Contraception  Wrap Up   Current Method No Contraceptive Precautions          The pregnancy intention screening data noted above was reviewed. Potential methods of contraception were discussed. The patient elected to proceed with Female Condom.   UA NEG today  [place] pending    Options for RLQ pain and menstrual pains discussed    Call after [place] May 2    F/U  Return in about 1 year (around 06/22/2021) for Annual.  Barnett Applebaum, MD, [facility], [facility] 06/22/2020  10:42 AM

## 2020-06-27 ENCOUNTER — Ambulatory Visit
Admission: RE | Admit: 2020-06-27 | Discharge: 2020-06-27 | Disposition: A | Discharge status: Home / Self Care | Payer: Federal, State, Local not specified - PPO | Source: Ambulatory Visit | Attending: Obstetrics and Gynecology | Admitting: Obstetrics and Gynecology

## 2020-06-27 ENCOUNTER — Other Ambulatory Visit: Payer: Self-pay

## 2020-06-27 DIAGNOSIS — N888 Other specified noninflammatory disorders of cervix uteri: Secondary | ICD-10-CM | POA: Diagnosis not present

## 2020-06-27 DIAGNOSIS — D252 Subserosal leiomyoma of uterus: Secondary | ICD-10-CM | POA: Diagnosis not present

## 2020-06-27 DIAGNOSIS — R1031 Right lower quadrant pain: Secondary | ICD-10-CM | POA: Diagnosis not present

## 2020-06-28 ENCOUNTER — Other Ambulatory Visit: Payer: Self-pay | Admitting: Obstetrics & Gynecology

## 2020-06-28 DIAGNOSIS — D219 Benign neoplasm of connective and other soft tissue, unspecified: Secondary | ICD-10-CM

## 2020-06-28 NOTE — Progress Notes (Signed)
Telephone Discussion w patient:  Review of ULTRASOUND. I have personally reviewed images and report of recent ultrasound done.  Plan of management is discussed with patient by phone call today.  Fibroid treatment such as [place], Lupron, Myomectomy, and Hysterectomy discussed in detail, with the pros and cons of each choice counseled.  No treatment as an option also discussed, as well as control of symptoms alone with hormone therapy. Pt would like more information on [place], and a referral will be made to vascular surgery.    Barnett Applebaum, MD, [facility], [facility] 06/28/2020  3:26 PM

## 2020-06-28 NOTE — Progress Notes (Signed)
FYI

## 2020-06-28 NOTE — Progress Notes (Signed)
Sounds good  Thx

## 2020-06-28 NOTE — Progress Notes (Signed)
I amended the result release information, after I too could not reach her by phone.  As the fibroids may have some contribution to her pain with no other abnormalities, I would like to discuss that w her.

## 2020-07-07 DIAGNOSIS — Z20822 Contact with and (suspected) exposure to covid-19: Secondary | ICD-10-CM | POA: Diagnosis not present

## 2020-07-07 DIAGNOSIS — F320 Major depressive disorder, single episode, mild: Secondary | ICD-10-CM | POA: Diagnosis not present

## 2020-07-08 ENCOUNTER — Encounter (INDEPENDENT_AMBULATORY_CARE_PROVIDER_SITE_OTHER): Payer: Self-pay | Admitting: Vascular Surgery

## 2020-07-08 ENCOUNTER — Ambulatory Visit (INDEPENDENT_AMBULATORY_CARE_PROVIDER_SITE_OTHER): Payer: Federal, State, Local not specified - PPO | Admitting: Vascular Surgery

## 2020-07-08 ENCOUNTER — Other Ambulatory Visit: Payer: Self-pay

## 2020-07-08 VITALS — BP 119/82 | HR 73 | Resp 16 | Ht 62.0 in | Wt 183.4 lb

## 2020-07-08 DIAGNOSIS — D252 Subserosal leiomyoma of uterus: Secondary | ICD-10-CM | POA: Diagnosis not present

## 2020-07-08 DIAGNOSIS — D259 Leiomyoma of uterus, unspecified: Secondary | ICD-10-CM | POA: Insufficient documentation

## 2020-07-08 DIAGNOSIS — I10 Essential (primary) hypertension: Secondary | ICD-10-CM

## 2020-07-08 NOTE — Assessment & Plan Note (Signed)
The patient has three subserosal fibroids, the largest of which is posterior. She has significant nominal, pelvic, and sacral pain.  With the larger being posterior, the sacral pain can certainly be from this.  This has gradually progressed over time.  She does not want a hysterectomy.  Her gynecologist offered her referral for uterine artery embolization to treat these fibroids which seems very reasonable.  I had a long discussion today with her about the risks and benefits the procedure.  I discussed with her how the procedures performed.  This would be either an outpatient or an overnight stay.  Bilateral uterine artery embolization be performed.  She voices her understanding and desires to proceed.  This will be scheduled in the near future at her convenience.

## 2020-07-08 NOTE — Assessment & Plan Note (Signed)
blood pressure control important in reducing the progression of atherosclerotic disease. On appropriate oral medications.  

## 2020-07-08 NOTE — H&P (View-Only) (Signed)
Patient ID: Sabrina Smith, female   DOB: [birth year], 40 y.o.   MRN: [id number]  Chief Complaint  Patient presents with  . New Patient (Initial Visit)    Ref Kenton Kingfisher [place] consultation    HPI Sabrina Smith is a 40 y.o. female.  I am asked to see the patient by Dr. Kenton Kingfisher for evaluation for uterine artery embolization.  The patient has progressive abdominal, pelvic, and sacral pain that has been getting worse over many months.  She has had a fairly extensive work-up and had an ultrasound which I have reviewed.  She has 3 fibroids, the largest of which is posterior with 1 on the left and 1 on the right.  These are subserosal.  After discussion with her gynecologist, she is referred for evaluation for uterine artery embolization as she does not want hysterectomy. No other complaints today.     Past Medical History:  Diagnosis Date  . Allergic rhinitis    severe ([place])  . Asthma    as a child  . History of chicken pox   . Hypertension   . Shingles 1990s    Past Surgical History:  Procedure Laterality Date  . COLONOSCOPY  02/2018   hemorrhoids, WNL (Outlaw)     Family History  Problem Relation Age of Onset  . Diabetes Maternal Aunt   . Hypertension Maternal Aunt   . Diabetes Maternal Uncle   . Hypertension Maternal Uncle   . Stroke Maternal Uncle   . Heart failure Maternal Grandmother   . Diabetes Maternal Grandfather   . Cancer Maternal Grandfather        prostate  . Hypertension Mother   . Hypertension Brother   . Asthma Daughter   . Eczema Daughter   . Heart failure Cousin   . Allergic rhinitis Neg Hx   . Urticaria Neg Hx      Social History   Tobacco Use  . Smoking status: Former Smoker    Years: 2.00    Types: Cigarettes    Quit date: 2006    Years since quitting: 16.3  . Smokeless tobacco: Never Used  . Tobacco comment: 1cig daily  Vaping Use  . Vaping Use: Never used  Substance Use Topics  . Alcohol use: Yes    Alcohol/week: 1.0 standard  drink    Types: 1 Glasses of wine per week    Comment: occassionally  . Drug use: No    Allergies  Allergen Reactions  . Sulfa Antibiotics Hives  . Other     Soybean, Almond, Hazelnut  . Sulfa Drugs Cross Reactors Hives    Current Outpatient Medications  Medication Sig Dispense Refill  . DRYSOL 20 % external solution Apply topically as needed.     Marland Kitchen omeprazole (PRILOSEC) 40 MG capsule Take 1 capsule (40 mg total) by mouth daily as needed (heartburn). 30 capsule 3  . XHANCE 93 MCG/ACT EXHU BLOW 2 DOSES IN EACH NOSTRIL TWICE DAILY 16 mL 0  . diltiazem (CARDIZEM CD) 120 MG 24 hr capsule Take 1 capsule (120 mg total) by mouth daily. 90 capsule 3   No current facility-administered medications for this visit.      REVIEW OF SYSTEMS (Negative unless checked)  Constitutional: [] Weight loss  [] Fever  [] Chills Cardiac: [] Chest pain   [] Chest pressure   [] Palpitations   [] Shortness of breath when laying flat   [] Shortness of breath at rest   [] Shortness of breath with exertion. Vascular:  [] Pain in legs with walking   []   Pain in legs at rest   [] Pain in legs when laying flat   [] Claudication   [] Pain in feet when walking  [] Pain in feet at rest  [] Pain in feet when laying flat   [] History of DVT   [] Phlebitis   [] Swelling in legs   [] Varicose veins   [] Non-healing ulcers Pulmonary:   [] Uses home oxygen   [] Productive cough   [] Hemoptysis   [] Wheeze  [] COPD   [] Asthma Neurologic:  [] Dizziness  [] Blackouts   [] Seizures   [] History of stroke   [] History of TIA  [] Aphasia   [] Temporary blindness   [] Dysphagia   [] Weakness or numbness in arms   [] Weakness or numbness in legs Musculoskeletal:  [] Arthritis   [] Joint swelling   [] Joint pain   [] Low back pain Hematologic:  [] Easy bruising  [] Easy bleeding   [] Hypercoagulable state   [] Anemic  [] Hepatitis Gastrointestinal:  [] Blood in stool   [] Vomiting blood  [] Gastroesophageal reflux/heartburn   [x] Abdominal pain Genitourinary:  [] Chronic kidney  disease   [] Difficult urination  [] Frequent urination  [] Burning with urination   [] Hematuria Skin:  [] Rashes   [] Ulcers   [] Wounds Psychological:  [] History of anxiety   []  History of major depression.    Physical Exam BP 119/82 (BP Location: Right Arm)   Pulse 73   Resp 16   Ht 5\' 2"  (1.575 m)   Wt 183 lb 6.4 oz (83.2 kg)   BMI 33.54 kg/m  Gen:  WD/WN, NAD Head: [place]/AT, No temporalis wasting.  Ear/Nose/Throat: Hearing grossly intact, nares w/o erythema or drainage, oropharynx w/o Erythema/Exudate Eyes: Conjunctiva clear, sclera non-icteric  Neck: trachea midline.  No JVD.  Pulmonary:  Good air movement, respirations not labored, no use of accessory muscles  Cardiac: RRR, no JVD Vascular:  Vessel Right Left  Radial Palpable Palpable                                   Gastrointestinal:. No masses, surgical incisions, or scars. Musculoskeletal: M/S 5/5 throughout.  Extremities without ischemic changes.  No deformity or atrophy. No edema. Neurologic: Sensation grossly intact in extremities.  Symmetrical.  Speech is fluent. Motor exam as listed above. Psychiatric: Judgment intact, Mood & affect appropriate for pt's clinical situation. Dermatologic: No rashes or ulcers noted.  No cellulitis or open wounds.    Radiology US PELVIC COMPLETE WITH TRANSVAGINAL  Result Date: 06/27/2020 CLINICAL DATA:  RIGHT lower quadrant abdominal pain, only before menses starts, LMP 06/04/2020 EXAM: TRANSABDOMINAL AND TRANSVAGINAL ULTRASOUND OF PELVIS TECHNIQUE: Both transabdominal and transvaginal ultrasound examinations of the pelvis were performed. Transabdominal technique was performed for global imaging of the pelvis including uterus, ovaries, adnexal regions, and pelvic cul-de-sac. It was necessary to proceed with endovaginal exam following the transabdominal exam to visualize the ovaries and adnexa. COMPARISON:  None FINDINGS: Uterus Measurements: 11.5 x 7.1 x 6.2 cm = volume: 267 mL.  Anteverted. Nabothian cysts at cervix. Heterogeneous myometrium. At least 3 uterine leiomyomata are identified. Posterior leiomyoma, subserosal, 5.4 x 5.0 x 3.2 cm. LEFT lateral leiomyoma 4.1 x 3.9 x 3.1 cm, subserosal. Small anterior RIGHT subserosal leiomyoma 1.4 cm diameter. Endometrium Thickness: 11 mm.  Mildly heterogeneous without focal mass or fluid Right ovary Measurements: 3.7 x 1.6 x 2.4 cm = volume: 7.6 mL. Normal morphology without mass Left ovary Measurements: 3.1 x 1.8 x 1.8 cm = volume: 5.4 mL. Normal morphology without mass Other findings Trace free pelvic fluid.  No adnexal masses. IMPRESSION: Three subserosal leiomyomata within uterus, largest 5.4 cm diameter. Remainder of exam unremarkable. Electronically Signed   By: Lavonia Dana M.D.   On: 06/27/2020 15:31    Labs Recent Results (from the past 2160 hour(s))  Urine Culture     Status: None   Collection Time: 06/01/20  3:17 PM   Specimen: Urine, Clean Catch   UR  Result Value Ref Range   Urine Culture, Routine Final report    Organism ID, Bacteria Lactobacillus species     Comment: 25,000-50,000 colony forming units per mL Susceptibility not normally performed on this organism.   POCT Urinalysis Dipstick     Status: Normal   Collection Time: 06/01/20  4:12 PM  Result Value Ref Range   Color, UA yellow    Clarity, UA clear    Glucose, UA Negative Negative   Bilirubin, UA neg    Ketones, UA neg    Spec Grav, UA 1.020 1.010 - 1.025   Blood, UA neg    pH, UA 5.0 5.0 - 8.0   Protein, UA Negative Negative   Urobilinogen, UA     Nitrite, UA neg    Leukocytes, UA Negative Negative   Appearance     Odor    POCT Urinalysis Dipstick     Status: Normal   Collection Time: 06/22/20 10:28 AM  Result Value Ref Range   Color, UA yellow    Clarity, UA clear    Glucose, UA Negative Negative   Bilirubin, UA neg    Ketones, UA neg    Spec Grav, UA 1.020 1.010 - 1.025   Blood, UA trace    pH, UA 6.0 5.0 - 8.0   Protein, UA  Negative Negative   Urobilinogen, UA 0.2 0.2 or 1.0 E.U./dL   Nitrite, UA neg    Leukocytes, UA Negative Negative   Appearance normal    Odor normal     Assessment/Plan:  Essential hypertension blood pressure control important in reducing the progression of atherosclerotic disease. On appropriate oral medications.   Uterine fibroid The patient has three subserosal fibroids, the largest of which is posterior. She has significant nominal, pelvic, and sacral pain.  With the larger being posterior, the sacral pain can certainly be from this.  This has gradually progressed over time.  She does not want a hysterectomy.  Her gynecologist offered her referral for uterine artery embolization to treat these fibroids which seems very reasonable.  I had a long discussion today with her about the risks and benefits the procedure.  I discussed with her how the procedures performed.  This would be either an outpatient or an overnight stay.  Bilateral uterine artery embolization be performed.  She voices her understanding and desires to proceed.  This will be scheduled in the near future at her convenience.      Leotis Pain 07/08/2020, 1:01 PM   This note was created with Dragon medical transcription system.  Any errors from dictation are unintentional.

## 2020-07-08 NOTE — Patient Instructions (Signed)
Uterine Artery Embolization for Fibroids  Uterine artery embolization is a procedure to shrink uterine fibroids. Uterine fibroids are masses of tissue (tumors) that can develop in the womb (uterus). They are also called leiomyomas. This type of tumor is not cancerous (benign) and does not spread to other parts of the body outside of the pelvic area. The pelvic area is the part of the body between the hip bones. You can have one or many fibroids. Fibroids can vary in size, shape, weight, and where they grow in the uterus. Some can become quite large. In this procedure, a thin plastic tube (catheter) is used to inject a chemical that blocks off the blood supply to the fibroid, which causes the fibroid to shrink. Tell a health care provider about:  Any allergies you have.  All medicines you are taking, including vitamins, herbs, eye drops, creams, and over-the-counter medicines.  Any problems you or family members have had with anesthetic medicines.  Any blood disorders you have.  Any surgeries you have had.  Any medical conditions you have.  Whether you are pregnant or may be pregnant. What are the risks? Generally, this is a safe procedure. However, problems may occur, including:  Bleeding.  Allergic reactions to medicines or dyes.  Damage to other structures or organs.  Infection, including blood infection (septicemia).  Injury to the uterus from decreased blood supply.  Lack of menstrual periods (amenorrhea).  Death of tissue cells (necrosis) around your bladder or vulva.  Development of a hole between organs or from an organ to the surface of your skin (fistula).  Blood clot in the legs (deep vein thrombosis) or lung (pulmonary embolus).  Nausea and vomiting. What happens before the procedure? Staying hydrated Follow instructions from your health care provider about hydration, which may include:  Up to 2 hours before the procedure - you may continue to drink clear  liquids, such as water, clear fruit juice, black coffee, and plain tea. Eating and drinking restrictions Follow instructions from your health care provider about eating and drinking, which may include:  8 hours before the procedure - stop eating heavy meals or foods such as meat, fried foods, or fatty foods.  6 hours before the procedure - stop eating light meals or foods, such as toast or cereal.  6 hours before the procedure - stop drinking milk or drinks that contain milk.  2 hours before the procedure - stop drinking clear liquids. Medicines  Ask your health care provider about: ? Changing or stopping your regular medicines. This is especially important if you are taking diabetes medicines or blood thinners. ? Taking over-the-counter medicines, vitamins, herbs, and supplements. ? Taking medicines such as aspirin and ibuprofen. These medicines can thin your blood. Do not take these medicines unless your health care provider tells you to take them.  You may be given antibiotic medicine to help prevent infection.  You may be given medicine to prevent nausea and vomiting (antiemetic). General instructions  Ask your health care provider how your surgical site will be marked or identified.  You may be asked to shower with a germ-killing soap.  Plan to have someone take you home from the hospital or clinic.  If you will be going home right after the procedure, plan to have someone with you for 24 hours.  You will be asked to empty your bladder. What happens during the procedure?  To lower your risk of infection: ? Your health care team will wash or sanitize their hands. ?   Hair may be removed from the surgical area. ? Your skin will be washed with soap.  An IV will be inserted into one of your veins.  You will be given one or more of the following: ? A medicine to help you relax (sedative). ? A medicine to numb the area (local anesthetic).  A small cut (incision) will be made  in your groin.  A catheter will be inserted into the main artery of your leg. The catheter will be guided through the artery to your uterus.  A series of images will be taken while dye is injected through the catheter in your groin. X-rays are taken at the same time. This is done to provide a road map of the blood supply to your uterus and fibroids.  Tiny plastic spheres, about the size of sand grains, will be injected through the catheter. Metal coils may be used to help block the artery. The particles will lodge in tiny branches of the uterine artery that supplies blood to the fibroids.  The procedure will be repeated on the artery that supplies the other side of the uterus.  The catheter will be removed and pressure will be applied to stop the bleeding.  A dressing will be placed over the incision. The procedure may vary among health care providers and hospitals. What happens after the procedure?  Your blood pressure, heart rate, breathing rate, and blood oxygen level will be monitored until the medicines you were given have worn off.  You will be given pain medicine as needed.  You may be given medicine for nausea and vomiting as needed.  Do not drive for 24 hours if you were given a sedative. Summary  Uterine artery embolization is a procedure to shrink uterine fibroids by blocking the blood supply to the fibroid.  You may be given a sedative and local anesthetic for the procedure.  A catheter will be inserted into the main artery of your leg. The catheter will be guided through the artery to your uterus.  After the procedure you will be given pain medicine and medicine for nausea as needed.  Do not drive for 24 hours if you were given a sedative. This information is not intended to replace advice given to you by your health care provider. Make sure you discuss any questions you have with your health care provider. Document Revised: 01/25/2017 Document Reviewed:  05/17/2016 Elsevier Patient Education  2021 [employer].

## 2020-07-08 NOTE — Progress Notes (Signed)
  Patient ID: Sabrina Smith, female   DOB: 09/18/1980, 40 y.o.   MRN: [id number]  Chief Complaint  Patient presents with  . New Patient (Initial Visit)    Ref [facility] consultation    HPI Sabrina Smith is a 40 y.o. female.  I am asked to see the patient by Dr. Harris for evaluation for uterine artery embolization.  The patient has progressive abdominal, pelvic, and sacral pain that has been getting worse over many months.  She has had a fairly extensive work-up and had an ultrasound which I have reviewed.  She has 3 fibroids, the largest of which is posterior with 1 on the left and 1 on the right.  These are subserosal.  After discussion with her gynecologist, she is referred for evaluation for uterine artery embolization as she does not want hysterectomy. No other complaints today.     Past Medical History:  Diagnosis Date  . Allergic rhinitis    severe ([place])  . Asthma    as a child  . History of chicken pox   . Hypertension   . Shingles 1990s    Past Surgical History:  Procedure Laterality Date  . COLONOSCOPY  02/2018   hemorrhoids, WNL ([facility])     Family History  Problem Relation Age of Onset  . Diabetes Maternal Aunt   . Hypertension Maternal Aunt   . Diabetes Maternal Uncle   . Hypertension Maternal Uncle   . Stroke Maternal Uncle   . Heart failure Maternal Grandmother   . Diabetes Maternal Grandfather   . Cancer Maternal Grandfather        prostate  . Hypertension Mother   . Hypertension Brother   . Asthma Daughter   . Eczema Daughter   . Heart failure Cousin   . Allergic rhinitis Neg Hx   . Urticaria Neg Hx      Social History   Tobacco Use  . Smoking status: Former Smoker    Years: 2.00    Types: Cigarettes    Quit date: 2006    Years since quitting: 16.3  . Smokeless tobacco: Never Used  . Tobacco comment: 1cig daily  Vaping Use  . Vaping Use: Never used  Substance Use Topics  . Alcohol use: Yes    Alcohol/week: 1.0 standard  drink    Types: 1 Glasses of wine per week    Comment: occassionally  . Drug use: No    Allergies  Allergen Reactions  . Sulfa Antibiotics Hives  . Other     Soybean, Almond, Hazelnut  . Sulfa Drugs Cross Reactors Hives    Current Outpatient Medications  Medication Sig Dispense Refill  . DRYSOL 20 % external solution Apply topically as needed.     . omeprazole (PRILOSEC) 40 MG capsule Take 1 capsule (40 mg total) by mouth daily as needed (heartburn). 30 capsule 3  . XHANCE 93 MCG/ACT EXHU BLOW 2 DOSES IN EACH NOSTRIL TWICE DAILY 16 mL 0  . diltiazem (CARDIZEM CD) 120 MG 24 hr capsule Take 1 capsule (120 mg total) by mouth daily. 90 capsule 3   No current facility-administered medications for this visit.      REVIEW OF SYSTEMS (Negative unless checked)  Constitutional: []Weight loss  []Fever  []Chills Cardiac: []Chest pain   []Chest pressure   []Palpitations   []Shortness of breath when laying flat   []Shortness of breath at rest   []Shortness of breath with exertion. Vascular:  []Pain in legs with walking   []  Pain in legs at rest   [] Pain in legs when laying flat   [] Claudication   [] Pain in feet when walking  [] Pain in feet at rest  [] Pain in feet when laying flat   [] History of DVT   [] Phlebitis   [] Swelling in legs   [] Varicose veins   [] Non-healing ulcers Pulmonary:   [] Uses home oxygen   [] Productive cough   [] Hemoptysis   [] Wheeze  [] COPD   [] Asthma Neurologic:  [] Dizziness  [] Blackouts   [] Seizures   [] History of stroke   [] History of TIA  [] Aphasia   [] Temporary blindness   [] Dysphagia   [] Weakness or numbness in arms   [] Weakness or numbness in legs Musculoskeletal:  [] Arthritis   [] Joint swelling   [] Joint pain   [] Low back pain Hematologic:  [] Easy bruising  [] Easy bleeding   [] Hypercoagulable state   [] Anemic  [] Hepatitis Gastrointestinal:  [] Blood in stool   [] Vomiting blood  [] Gastroesophageal reflux/heartburn   [x] Abdominal pain Genitourinary:  [] Chronic kidney  disease   [] Difficult urination  [] Frequent urination  [] Burning with urination   [] Hematuria Skin:  [] Rashes   [] Ulcers   [] Wounds Psychological:  [] History of anxiety   []  History of major depression.    Physical Exam BP 119/82 (BP Location: Right Arm)   Pulse 73   Resp 16   Ht 5\' 2"  (1.575 m)   Wt 183 lb 6.4 oz (83.2 kg)   BMI 33.54 kg/m  Gen:  WD/WN, NAD Head: [place]/AT, No temporalis wasting.  Ear/Nose/Throat: Hearing grossly intact, nares w/o erythema or drainage, oropharynx w/o Erythema/Exudate Eyes: Conjunctiva clear, sclera non-icteric  Neck: trachea midline.  No JVD.  Pulmonary:  Good air movement, respirations not labored, no use of accessory muscles  Cardiac: RRR, no JVD Vascular:  Vessel Right Left  Radial Palpable Palpable                                   Gastrointestinal:. No masses, surgical incisions, or scars. Musculoskeletal: M/S 5/5 throughout.  Extremities without ischemic changes.  No deformity or atrophy. No edema. Neurologic: Sensation grossly intact in extremities.  Symmetrical.  Speech is fluent. Motor exam as listed above. Psychiatric: Judgment intact, Mood & affect appropriate for pt's clinical situation. Dermatologic: No rashes or ulcers noted.  No cellulitis or open wounds.    Radiology US PELVIC COMPLETE WITH TRANSVAGINAL  Result Date: 06/27/2020 CLINICAL DATA:  RIGHT lower quadrant abdominal pain, only before menses starts, LMP 06/04/2020 EXAM: TRANSABDOMINAL AND TRANSVAGINAL ULTRASOUND OF PELVIS TECHNIQUE: Both transabdominal and transvaginal ultrasound examinations of the pelvis were performed. Transabdominal technique was performed for global imaging of the pelvis including uterus, ovaries, adnexal regions, and pelvic cul-de-sac. It was necessary to proceed with endovaginal exam following the transabdominal exam to visualize the ovaries and adnexa. COMPARISON:  None FINDINGS: Uterus Measurements: 11.5 x 7.1 x 6.2 cm = volume: 267 mL.  Anteverted. Nabothian cysts at cervix. Heterogeneous myometrium. At least 3 uterine leiomyomata are identified. Posterior leiomyoma, subserosal, 5.4 x 5.0 x 3.2 cm. LEFT lateral leiomyoma 4.1 x 3.9 x 3.1 cm, subserosal. Small anterior RIGHT subserosal leiomyoma 1.4 cm diameter. Endometrium Thickness: 11 mm.  Mildly heterogeneous without focal mass or fluid Right ovary Measurements: 3.7 x 1.6 x 2.4 cm = volume: 7.6 mL. Normal morphology without mass Left ovary Measurements: 3.1 x 1.8 x 1.8 cm = volume: 5.4 mL. Normal morphology without mass Other findings Trace free pelvic fluid.  No adnexal masses. IMPRESSION: Three subserosal leiomyomata within uterus, largest 5.4 cm diameter. Remainder of exam unremarkable. Electronically Signed   By: Mark  Boles M.D.   On: 06/27/2020 15:31    Labs Recent Results (from the past 2160 hour(s))  Urine Culture     Status: None   Collection Time: 06/01/20  3:17 PM   Specimen: Urine, Clean Catch   UR  Result Value Ref Range   Urine Culture, Routine Final report    Organism ID, Bacteria Lactobacillus species     Comment: 25,000-50,000 colony forming units per mL Susceptibility not normally performed on this organism.   POCT Urinalysis Dipstick     Status: Normal   Collection Time: 06/01/20  4:12 PM  Result Value Ref Range   Color, UA yellow    Clarity, UA clear    Glucose, UA Negative Negative   Bilirubin, UA neg    Ketones, UA neg    Spec Grav, UA 1.020 1.010 - 1.025   Blood, UA neg    pH, UA 5.0 5.0 - 8.0   Protein, UA Negative Negative   Urobilinogen, UA     Nitrite, UA neg    Leukocytes, UA Negative Negative   Appearance     Odor    POCT Urinalysis Dipstick     Status: Normal   Collection Time: 06/22/20 10:28 AM  Result Value Ref Range   Color, UA yellow    Clarity, UA clear    Glucose, UA Negative Negative   Bilirubin, UA neg    Ketones, UA neg    Spec Grav, UA 1.020 1.010 - 1.025   Blood, UA trace    pH, UA 6.0 5.0 - 8.0   Protein, UA  Negative Negative   Urobilinogen, UA 0.2 0.2 or 1.0 E.U./dL   Nitrite, UA neg    Leukocytes, UA Negative Negative   Appearance normal    Odor normal     Assessment/Plan:  Essential hypertension blood pressure control important in reducing the progression of atherosclerotic disease. On appropriate oral medications.   Uterine fibroid The patient has three subserosal fibroids, the largest of which is posterior. She has significant nominal, pelvic, and sacral pain.  With the larger being posterior, the sacral pain can certainly be from this.  This has gradually progressed over time.  She does not want a hysterectomy.  Her gynecologist offered her referral for uterine artery embolization to treat these fibroids which seems very reasonable.  I had a long discussion today with her about the risks and benefits the procedure.  I discussed with her how the procedures performed.  This would be either an outpatient or an overnight stay.  Bilateral uterine artery embolization be performed.  She voices her understanding and desires to proceed.  This will be scheduled in the near future at her convenience.      Raheel Kunkle 07/08/2020, 1:01 PM   This note was created with Dragon medical transcription system.  Any errors from dictation are unintentional.   

## 2020-07-12 ENCOUNTER — Telehealth (INDEPENDENT_AMBULATORY_CARE_PROVIDER_SITE_OTHER): Payer: Self-pay

## 2020-07-12 ENCOUNTER — Other Ambulatory Visit (INDEPENDENT_AMBULATORY_CARE_PROVIDER_SITE_OTHER): Payer: Self-pay | Admitting: Nurse Practitioner

## 2020-07-12 NOTE — Telephone Encounter (Signed)
Spoke with the patient regarding getting scheduled for a Uterine artery embolization. Patient was offered 07/18/20, 07/21/20, 08/02/20 and 08/04/20 patient declined all dates. Patient requested to have her procedure on 08/15/20 with a 6:45 am arrival time to the [facility] with Dr. Lucky Cowboy. Pre-procedure instructions were discussed and will be mailed.

## 2020-07-13 ENCOUNTER — Other Ambulatory Visit: Payer: Self-pay

## 2020-07-13 ENCOUNTER — Encounter: Payer: Self-pay | Admitting: Internal Medicine

## 2020-07-13 ENCOUNTER — Ambulatory Visit: Payer: Federal, State, Local not specified - PPO | Admitting: Internal Medicine

## 2020-07-13 VITALS — BP 100/80 | HR 62 | Ht 62.0 in | Wt 182.0 lb

## 2020-07-13 DIAGNOSIS — I10 Essential (primary) hypertension: Secondary | ICD-10-CM

## 2020-07-13 DIAGNOSIS — R002 Palpitations: Secondary | ICD-10-CM | POA: Diagnosis not present

## 2020-07-13 DIAGNOSIS — I491 Atrial premature depolarization: Secondary | ICD-10-CM | POA: Diagnosis not present

## 2020-07-13 NOTE — Progress Notes (Signed)
Follow-up Outpatient Visit Date: 07/13/2020  Primary Care Provider: Ria Bush, MD [place] [address]  Chief Complaint: Follow-up palpitations  HPI:  Sabrina Smith is a 40 y.o. female with history of PACs, hypertension, asthma, and GERD, who presents for follow-up of Tat[facility]'s.  She was last seen in our office in 12/2019 by Christell Faith, PA.  At that time she reported fewer palpitations with low-dose diltiazem.  She was trying to minimize her caffeine intake.  No medication changes or additional testing were made with prior event monitor showing predominantly sinus rhythm with rare PACs.  Today, Sabrina Smith reports that her palpitations have essentially resolved.  She attributes this to her stress, having taken off some time from work.  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  She denies chest pain, shortness of breath, palpitations, lightheadedness, or edema.  She is tolerating her medications, including low-dose diltiazem, well.  --------------------------------------------------------------------------------------------------  Cardiovascular History & Procedures: Cardiovascular Problems:  Palpitations  Chest pain  Risk Factors:  Hypertension, obesity, and prior tobacco use  Cath/PCI:  None  CV Surgery:  None  EP Procedures and Devices:  14-day event monitor (11/24/2019): Predominantly sinus rhythm with rare PACs.  No significant arrhythmia.  Non-Invasive Evaluation(s):  TTE (10/27/2019): Normal LV size and wall thickness.  LVEF 55-60% with normal diastolic function.  Normal RV size and function.  Normal PA pressure.  Normal biatrial size.  Mild mitral and tricuspid regurgitation.  Normal CVP.  Recent CV Pertinent Labs: Lab Results  Component Value Date   CHOL 192 01/11/2020   HDL 54.10 01/11/2020   LDLCALC 121 (H) 01/11/2020   TRIG 82.0 01/11/2020   CHOLHDL 4 01/11/2020   K 4.0 01/11/2020   MG 2.1 09/30/2019   BUN 12  01/11/2020   BUN 10 09/30/2019   CREATININE 0.72 01/11/2020    Past medical and surgical history were reviewed and updated in EPIC.  Current Meds  Medication Sig  . diltiazem (CARDIZEM CD) 120 MG 24 hr capsule Take 1 capsule (120 mg total) by mouth daily.  . DRYSOL 20 % external solution Apply topically as needed.   Marland Kitchen omeprazole (PRILOSEC) 40 MG capsule Take 1 capsule (40 mg total) by mouth daily as needed (heartburn).  Truett Perna 93 MCG/ACT EXHU BLOW 2 DOSES IN EACH NOSTRIL TWICE DAILY    Allergies: Sulfa antibiotics, Other, and Sulfa drugs cross reactors  Social History   Tobacco Use  . Smoking status: Former Smoker    Years: 2.00    Types: Cigarettes    Quit date: 2006    Years since quitting: 16.3  . Smokeless tobacco: Never Used  . Tobacco comment: 1cig daily  Vaping Use  . Vaping Use: Never used  Substance Use Topics  . Alcohol use: Yes    Alcohol/week: 1.0 standard drink    Types: 1 Glasses of wine per week    Comment: occassionally  . Drug use: No    Family History  Problem Relation Age of Onset  . Diabetes Maternal Aunt   . Hypertension Maternal Aunt   . Diabetes Maternal Uncle   . Hypertension Maternal Uncle   . Stroke Maternal Uncle   . Heart failure Maternal Grandmother   . Diabetes Maternal Grandfather   . Cancer Maternal Grandfather        prostate  . Hypertension Mother   . Hypertension Brother   . Asthma Daughter   . Eczema Daughter   . Heart  failure Cousin   . Allergic rhinitis Neg Hx   . Urticaria Neg Hx     Review of Systems: A 12-system review of systems was performed and was negative except as noted in the HPI.  --------------------------------------------------------------------------------------------------  Physical Exam: BP 100/80 (BP Location: Left Arm, Patient Position: Sitting, Cuff Size: Large)   Pulse 62   Ht 5\' 2"  (1.575 m)   Wt 182 lb (82.6 kg)   SpO2 98%   BMI 33.29 kg/m   General:  NAD. Neck: No JVD or  HJR. Lungs: Clear to auscultation bilaterally without wheezes or crackles. Heart: Regular rate and rhythm without murmurs, rubs, or gallops. Abdomen: Soft, nontender, nondistended. Extremities: No lower extremity edema.  EKG:  Normal sinus rhythm with poor R wave progression in V1 through V2, likely related to lead placement.  No change from prior tracing on 01/11/2020.  Lab Results  Component Value Date   WBC 9.9 06/19/2019   HGB 14.7 06/19/2019   HCT 43.9 06/19/2019   MCV 86 06/19/2019   PLT 286 06/19/2019    Lab Results  Component Value Date   NA 136 01/11/2020   K 4.0 01/11/2020   CL 102 01/11/2020   CO2 26 01/11/2020   BUN 12 01/11/2020   CREATININE 0.72 01/11/2020   GLUCOSE 88 01/11/2020   ALT 14 01/11/2020    Lab Results  Component Value Date   CHOL 192 01/11/2020   HDL 54.10 01/11/2020   LDLCALC 121 (H) 01/11/2020   TRIG 82.0 01/11/2020   CHOLHDL 4 01/11/2020    --------------------------------------------------------------------------------------------------  ASSESSMENT AND PLAN: Palpitations and PACs: Symptoms have resolved.  Continue low-dose diltiazem and avoidance of caffeine.  No further work-up planned at this time.  Hypertension: Blood pressure well controlled today.  No medication changes.  Follow-up: Return to clinic in 1 year.  Sabrina Bush, MD 07/13/2020 4:41 PM

## 2020-07-13 NOTE — Patient Instructions (Signed)
Medication Instructions:  - Your physician recommends that you continue on your current medications as directed. Please refer to the Current Medication list given to you today.  *If you need a refill on your cardiac medications before your next appointment, please call your pharmacy*   Lab Work: - none ordered  If you have labs (blood work) drawn today and your tests are completely normal, you will receive your results only by: . MyChart Message (if you have MyChart) OR . A paper copy in the mail If you have any lab test that is abnormal or we need to change your treatment, we will call you to review the results.   Testing/Procedures: - none ordered   Follow-Up: At [facility], you and your health needs are our priority.  As part of our continuing mission to provide you with exceptional heart care, we have created designated Provider Care Teams.  These Care Teams include your primary Cardiologist (physician) and Advanced Practice Providers (APPs -  Physician Assistants and Nurse Practitioners) who all work together to provide you with the care you need, when you need it.  We recommend signing up for the patient portal called "MyChart".  Sign up information is provided on this After Visit Summary.  MyChart is used to connect with patients for Virtual Visits (Telemedicine).  Patients are able to view lab/test results, encounter notes, upcoming appointments, etc.  Non-urgent messages can be sent to your provider as well.   To learn more about what you can do with MyChart, go to [email or web address].    Your next appointment:   1 year(s)  The format for your next appointment:   In Person  Provider:   You may see Christopher End, MD or one of the following Advanced Practice Providers on your designated Care Team:    Christopher Berge, NP  Ryan Dunn, PA-C  Jacquelyn Visser, PA-C  Cadence Furth, PA-C  Caitlin Walker, NP    Other Instructions - n/a  

## 2020-07-13 NOTE — Telephone Encounter (Signed)
Patient called to be rescheduled to 08/01/20 with Dr. Lucky Cowboy for the UFE procedure. Pre-procedure instructions will be mailed.

## 2020-07-14 ENCOUNTER — Encounter: Payer: Self-pay | Admitting: Internal Medicine

## 2020-07-14 DIAGNOSIS — I491 Atrial premature depolarization: Secondary | ICD-10-CM | POA: Insufficient documentation

## 2020-07-18 ENCOUNTER — Ambulatory Visit (INDEPENDENT_AMBULATORY_CARE_PROVIDER_SITE_OTHER): Payer: Federal, State, Local not specified - PPO | Admitting: Vascular Surgery

## 2020-08-01 ENCOUNTER — Observation Stay
Admission: RE | Admit: 2020-08-01 | Discharge: 2020-08-02 | Disposition: A | Discharge status: Home / Self Care | Payer: Federal, State, Local not specified - PPO | Attending: Vascular Surgery | Admitting: Vascular Surgery

## 2020-08-01 ENCOUNTER — Other Ambulatory Visit: Payer: Self-pay

## 2020-08-01 ENCOUNTER — Encounter: Payer: Self-pay | Admitting: Vascular Surgery

## 2020-08-01 ENCOUNTER — Other Ambulatory Visit (INDEPENDENT_AMBULATORY_CARE_PROVIDER_SITE_OTHER): Payer: Self-pay | Admitting: Nurse Practitioner

## 2020-08-01 ENCOUNTER — Encounter
Admission: RE | Disposition: A | Discharge status: Home / Self Care | Payer: Self-pay | Source: Home / Self Care | Attending: Vascular Surgery

## 2020-08-01 DIAGNOSIS — D259 Leiomyoma of uterus, unspecified: Secondary | ICD-10-CM | POA: Diagnosis not present

## 2020-08-01 DIAGNOSIS — Z79899 Other long term (current) drug therapy: Secondary | ICD-10-CM | POA: Insufficient documentation

## 2020-08-01 DIAGNOSIS — I10 Essential (primary) hypertension: Secondary | ICD-10-CM | POA: Insufficient documentation

## 2020-08-01 DIAGNOSIS — Z8619 Personal history of other infectious and parasitic diseases: Secondary | ICD-10-CM | POA: Insufficient documentation

## 2020-08-01 DIAGNOSIS — Z20822 Contact with and (suspected) exposure to covid-19: Secondary | ICD-10-CM | POA: Insufficient documentation

## 2020-08-01 DIAGNOSIS — D252 Subserosal leiomyoma of uterus: Secondary | ICD-10-CM | POA: Diagnosis not present

## 2020-08-01 DIAGNOSIS — Z882 Allergy status to sulfonamides status: Secondary | ICD-10-CM | POA: Insufficient documentation

## 2020-08-01 DIAGNOSIS — Z8249 Family history of ischemic heart disease and other diseases of the circulatory system: Secondary | ICD-10-CM | POA: Insufficient documentation

## 2020-08-01 DIAGNOSIS — Z87891 Personal history of nicotine dependence: Secondary | ICD-10-CM | POA: Diagnosis not present

## 2020-08-01 HISTORY — PX: EMBOLIZATION (CATH LAB): CATH118239

## 2020-08-01 LAB — CREATININE, SERUM
Creatinine, Ser: 0.75 mg/dL (ref 0.44–1.00)
GFR, Estimated: >60 mL/min (ref >60)

## 2020-08-01 LAB — COMPREHENSIVE METABOLIC PANEL
ALT: 18 U/L (ref 0–44)
AST: 15 U/L (ref 15–41)
Albumin: 4.2 g/dL (ref 3.5–5.0)
Alkaline Phosphatase: 47 U/L (ref 38–126)
Anion gap: 7 (ref 5–15)
BUN: 9 mg/dL (ref 6–20)
CO2: 24 mmol/L (ref 22–32)
Calcium: 8.6 mg/dL — ABNORMAL LOW (ref 8.9–10.3)
Chloride: 106 mmol/L (ref 98–111)
Creatinine, Ser: 0.67 mg/dL (ref 0.44–1.00)
GFR, Estimated: >60 mL/min (ref >60)
Glucose, Bld: 131 mg/dL — ABNORMAL HIGH (ref 70–99)
Potassium: 3.9 mmol/L (ref 3.5–5.1)
Sodium: 137 mmol/L (ref 135–145)
Total Bilirubin: 0.6 mg/dL (ref 0.3–1.2)
Total Protein: 7.2 g/dL (ref 6.5–8.1)

## 2020-08-01 LAB — PROTIME-INR
INR: 1 (ref 0.8–1.2)
Prothrombin Time: 13.1 seconds (ref 11.4–15.2)

## 2020-08-01 LAB — CBC
HCT: 43 % (ref 36.0–46.0)
Hemoglobin: 14.4 g/dL (ref 12.0–15.0)
MCH: 28.7 pg (ref 26.0–34.0)
MCHC: 33.5 g/dL (ref 30.0–36.0)
MCV: 85.7 fL (ref 80.0–100.0)
Platelets: 265 10*3/uL (ref 150–400)
RBC: 5.02 MIL/uL (ref 3.87–5.11)
RDW: 14.7 % (ref 11.5–15.5)
WBC: 11.2 10*3/uL — ABNORMAL HIGH (ref 4.0–10.5)
nRBC: 0 % (ref 0.0–0.2)

## 2020-08-01 LAB — SARS CORONAVIRUS 2 BY RT PCR (HOSPITAL ORDER, PERFORMED IN ~~LOC~~ HOSPITAL LAB): SARS Coronavirus 2: NEGATIVE

## 2020-08-01 LAB — HIV ANTIBODY (ROUTINE TESTING W REFLEX): HIV Screen 4th Generation wRfx: NONREACTIVE

## 2020-08-01 LAB — BUN: BUN: 11 mg/dL (ref 6–20)

## 2020-08-01 SURGERY — EMBOLIZATION
Anesthesia: Moderate Sedation

## 2020-08-01 MED ORDER — FENTANYL CITRATE (PF) 100 MCG/2ML IJ SOLN
INTRAMUSCULAR | Status: DC | PRN
  Administered 2020-08-01 (×3): 50 ug via INTRAVENOUS

## 2020-08-01 MED ORDER — ALUM & MAG HYDROXIDE-SIMETH 200-200-20 MG/5ML PO SUSP: 15.0000 mL | ORAL | Status: DC | PRN

## 2020-08-01 MED ORDER — KETOROLAC TROMETHAMINE 30 MG/ML IJ SOLN
30.0000 mg | Freq: Four times a day (QID) | INTRAMUSCULAR | Status: DC
  Administered 2020-08-01 (×2): 30 mg via INTRAVENOUS
  Filled 2020-08-01: qty 1

## 2020-08-01 MED ORDER — POLYETHYLENE GLYCOL 3350 17 G PO PACK
17.0000 g | PACK | Freq: Every day | ORAL | Status: DC | PRN
  Filled 2020-08-01: qty 1

## 2020-08-01 MED ORDER — NALOXONE HCL 0.4 MG/ML IJ SOLN: 0.4000 mg | INTRAMUSCULAR | Status: DC | PRN

## 2020-08-01 MED ORDER — HEPARIN SODIUM (PORCINE) 1000 UNIT/ML IJ SOLN
INTRAMUSCULAR | Status: DC | PRN
  Administered 2020-08-01: 3000 [IU] via INTRAVENOUS

## 2020-08-01 MED ORDER — HYDRALAZINE HCL 20 MG/ML IJ SOLN: 5.0000 mg | INTRAMUSCULAR | Status: DC | PRN

## 2020-08-01 MED ORDER — METOPROLOL TARTRATE 5 MG/5ML IV SOLN: 2.0000 mg | INTRAVENOUS | Status: DC | PRN

## 2020-08-01 MED ORDER — MIDAZOLAM HCL 2 MG/ML PO SYRP: 8.0000 mg | ORAL_SOLUTION | Freq: Once | ORAL | Status: DC | PRN

## 2020-08-01 MED ORDER — ONDANSETRON HCL 4 MG/2ML IJ SOLN
4.0000 mg | Freq: Four times a day (QID) | INTRAMUSCULAR | Status: DC | PRN
  Filled 2020-08-01: qty 2

## 2020-08-01 MED ORDER — HYDROMORPHONE HCL 1 MG/ML IJ SOLN
1.0000 mg | Freq: Once | INTRAMUSCULAR | Status: AC | PRN
Start: 2020-08-01 — End: 2020-08-01
  Administered 2020-08-01: 1 mg via INTRAVENOUS

## 2020-08-01 MED ORDER — METHYLPREDNISOLONE SODIUM SUCC 125 MG IJ SOLR: 125.0000 mg | Freq: Once | INTRAMUSCULAR | Status: DC | PRN

## 2020-08-01 MED ORDER — DIPHENHYDRAMINE HCL 12.5 MG/5ML PO ELIX
12.5000 mg | ORAL_SOLUTION | Freq: Four times a day (QID) | ORAL | Status: DC | PRN
  Filled 2020-08-01: qty 5

## 2020-08-01 MED ORDER — PHENOL 1.4 % MT LIQD
1.0000 | OROMUCOSAL | Status: DC | PRN
  Filled 2020-08-01: qty 177

## 2020-08-01 MED ORDER — HYDROMORPHONE HCL 1 MG/ML IJ SOLN
INTRAMUSCULAR | Status: AC
  Filled 2020-08-01: qty 1

## 2020-08-01 MED ORDER — GUAIFENESIN-DM 100-10 MG/5ML PO SYRP
15.0000 mL | ORAL_SOLUTION | ORAL | Status: DC | PRN
  Filled 2020-08-01: qty 15

## 2020-08-01 MED ORDER — DIPHENHYDRAMINE HCL 50 MG/ML IJ SOLN: 12.5000 mg | Freq: Four times a day (QID) | INTRAMUSCULAR | Status: DC | PRN

## 2020-08-01 MED ORDER — IODIXANOL 320 MG/ML IV SOLN
INTRAVENOUS | Status: DC | PRN
  Administered 2020-08-01: 75 mL

## 2020-08-01 MED ORDER — DILTIAZEM HCL ER COATED BEADS 120 MG PO CP24
120.0000 mg | ORAL_CAPSULE | Freq: Every day | ORAL | Status: DC
  Administered 2020-08-02: 120 mg via ORAL
  Filled 2020-08-01 (×2): qty 1

## 2020-08-01 MED ORDER — SODIUM CHLORIDE 0.9% FLUSH: 9.0000 mL | INTRAVENOUS | Status: DC | PRN

## 2020-08-01 MED ORDER — CEFAZOLIN SODIUM-DEXTROSE 2-4 GM/100ML-% IV SOLN
INTRAVENOUS | Status: AC
  Filled 2020-08-01: qty 100

## 2020-08-01 MED ORDER — PANTOPRAZOLE SODIUM 40 MG PO TBEC: 40.0000 mg | DELAYED_RELEASE_TABLET | Freq: Every day | ORAL | Status: DC

## 2020-08-01 MED ORDER — ACETAMINOPHEN 325 MG RE SUPP
325.0000 mg | RECTAL | Status: DC | PRN
Start: 2020-08-01 — End: 2020-08-02
  Filled 2020-08-01: qty 2

## 2020-08-01 MED ORDER — HEPARIN SODIUM (PORCINE) 1000 UNIT/ML IJ SOLN
INTRAMUSCULAR | Status: AC
  Filled 2020-08-01: qty 1

## 2020-08-01 MED ORDER — MORPHINE SULFATE 1 MG/ML IV SOLN PCA
INTRAVENOUS | Status: DC
  Filled 2020-08-01: qty 30

## 2020-08-01 MED ORDER — ONDANSETRON HCL 4 MG/2ML IJ SOLN: 4.0000 mg | Freq: Four times a day (QID) | INTRAMUSCULAR | Status: DC | PRN

## 2020-08-01 MED ORDER — SODIUM CHLORIDE 0.9 % IV SOLN: INTRAVENOUS | Status: DC

## 2020-08-01 MED ORDER — SORBITOL 70 % SOLN
30.0000 mL | Freq: Every day | Status: DC | PRN
  Filled 2020-08-01: qty 30

## 2020-08-01 MED ORDER — MIDAZOLAM HCL 2 MG/2ML IJ SOLN
INTRAMUSCULAR | Status: AC
  Filled 2020-08-01: qty 2

## 2020-08-01 MED ORDER — ONDANSETRON HCL 4 MG/2ML IJ SOLN
INTRAMUSCULAR | Status: AC
  Filled 2020-08-01: qty 2

## 2020-08-01 MED ORDER — CEFAZOLIN SODIUM-DEXTROSE 2-4 GM/100ML-% IV SOLN: 2.0000 g | Freq: Once | INTRAVENOUS | Status: AC

## 2020-08-01 MED ORDER — OXYCODONE-ACETAMINOPHEN 5-325 MG PO TABS
1.0000 | ORAL_TABLET | ORAL | Status: DC | PRN
Start: 2020-08-01 — End: 2020-08-02

## 2020-08-01 MED ORDER — MIDAZOLAM HCL 2 MG/2ML IJ SOLN
INTRAMUSCULAR | Status: DC | PRN
  Administered 2020-08-01 (×3): 2 mg via INTRAVENOUS

## 2020-08-01 MED ORDER — FENTANYL CITRATE (PF) 100 MCG/2ML IJ SOLN
INTRAMUSCULAR | Status: AC
  Filled 2020-08-01: qty 2

## 2020-08-01 MED ORDER — FAMOTIDINE 20 MG PO TABS: 40.0000 mg | ORAL_TABLET | Freq: Once | ORAL | Status: DC | PRN

## 2020-08-01 MED ORDER — GLYCOPYRROLATE 1 MG PO TABS
2.0000 mg | ORAL_TABLET | Freq: Every day | ORAL | Status: DC
  Administered 2020-08-02: 2 mg via ORAL
  Filled 2020-08-01: qty 2

## 2020-08-01 MED ORDER — ENOXAPARIN SODIUM 40 MG/0.4ML IJ SOSY
40.0000 mg | PREFILLED_SYRINGE | INTRAMUSCULAR | Status: DC
  Administered 2020-08-02: 40 mg via SUBCUTANEOUS
  Filled 2020-08-01 (×2): qty 0.4

## 2020-08-01 MED ORDER — ONDANSETRON HCL 4 MG/2ML IJ SOLN
4.0000 mg | Freq: Four times a day (QID) | INTRAMUSCULAR | Status: DC | PRN
  Administered 2020-08-01 (×2): 4 mg via INTRAVENOUS

## 2020-08-01 MED ORDER — ACETAMINOPHEN 325 MG PO TABS: 325.0000 mg | ORAL_TABLET | ORAL | Status: DC | PRN

## 2020-08-01 MED ORDER — PANTOPRAZOLE SODIUM 40 MG PO TBEC
40.0000 mg | DELAYED_RELEASE_TABLET | Freq: Every day | ORAL | Status: DC
  Administered 2020-08-02: 40 mg via ORAL
  Filled 2020-08-01 (×2): qty 1

## 2020-08-01 MED ORDER — POTASSIUM CHLORIDE CRYS ER 20 MEQ PO TBCR: 20.0000 meq | EXTENDED_RELEASE_TABLET | Freq: Once | ORAL | Status: DC

## 2020-08-01 MED ORDER — KETOROLAC TROMETHAMINE 60 MG/2ML IM SOLN
INTRAMUSCULAR | Status: AC
  Filled 2020-08-01: qty 2

## 2020-08-01 MED ORDER — LABETALOL HCL 5 MG/ML IV SOLN: 10.0000 mg | INTRAVENOUS | Status: DC | PRN

## 2020-08-01 MED ORDER — CEFAZOLIN SODIUM-DEXTROSE 2-4 GM/100ML-% IV SOLN
INTRAVENOUS | Status: AC
  Administered 2020-08-01: 2 g via INTRAVENOUS
  Filled 2020-08-01: qty 100

## 2020-08-01 MED ORDER — MIDAZOLAM HCL 2 MG/2ML IJ SOLN
INTRAMUSCULAR | Status: AC
  Filled 2020-08-01: qty 4

## 2020-08-01 MED ORDER — DIPHENHYDRAMINE HCL 50 MG/ML IJ SOLN: 50.0000 mg | Freq: Once | INTRAMUSCULAR | Status: DC | PRN

## 2020-08-01 SURGICAL SUPPLY — 27 items
BLOCK BEAD 500-700 (Vascular Products) ×4 IMPLANT
CATH ANGIO 5F 80CM MHK1-H (CATHETERS) ×2 IMPLANT
CATH ANGIO 5F PIGTAIL 65CM (CATHETERS) ×2 IMPLANT
CATH BEACON 5 .035 65 KMP TIP (CATHETERS) ×2 IMPLANT
CATH BEACON 5 .035 65 RIM TIP (CATHETERS) ×2 IMPLANT
CATH MICROCATH PRGRT 2.8F 110 (CATHETERS) ×1 IMPLANT
CATH TEMPO 5F RIM 65CM (CATHETERS) ×2 IMPLANT
COIL 400 COMPLEX STD 4X30CM (Vascular Products) ×2 IMPLANT
COIL 400 COMPLEX STD 4X35CM (Vascular Products) ×2 IMPLANT
COVER PROBE U/S 5X48 (MISCELLANEOUS) ×2 IMPLANT
DEVICE OCCLUSION POD4 (Vascular Products) ×2 IMPLANT
DEVICE OCCLUSION PODJ30 (Vascular Products) ×1 IMPLANT
DEVICE STARCLOSE SE CLOSURE (Vascular Products) ×2 IMPLANT
DEVICE TORQUE (MISCELLANEOUS) ×2 IMPLANT
GLIDEWIRE STIFF .35X180X3 HYDR (WIRE) ×2 IMPLANT
GUIDEWIRE ANGLED .035 180CM (WIRE) ×2 IMPLANT
HANDLE DETACHMENT COIL (MISCELLANEOUS) ×2 IMPLANT
MICROCATH PROGREAT 2.8F 110 CM (CATHETERS) ×2
OCCLUSION DEVICE POD4 (Vascular Products) ×4 IMPLANT
OCCLUSION DEVICE PODJ30 (Vascular Products) ×2 IMPLANT
PACK ANGIOGRAPHY (CUSTOM PROCEDURE TRAY) ×2 IMPLANT
SHEATH BRITE TIP 5FRX11 (SHEATH) ×2 IMPLANT
STOPCOCK 3 WAY MALE LL (IV SETS) ×1
STOPCOCK 3WAY MALE LL (IV SETS) ×1 IMPLANT
SYR MEDRAD MARK 7 150ML (SYRINGE) ×2 IMPLANT
TUBING CONTRAST HIGH PRESS 48 (TUBING) ×4 IMPLANT
WIRE GUIDERIGHT .035X150 (WIRE) ×2 IMPLANT

## 2020-08-01 NOTE — Progress Notes (Signed)
Patient has received 9 mg of Morphine since PCA started.

## 2020-08-01 NOTE — Interval H&P Note (Signed)
History and Physical Interval Note:  08/01/2020 8:20 AM  Sabrina Smith  has presented today for surgery, with the diagnosis of Uterine artery embolization  Uterine leiomyoma.  The various methods of treatment have been discussed with the patient and family. After consideration of risks, benefits and other options for treatment, the patient has consented to  Procedure(s): EMBOLIZATION (N/A) as a surgical intervention.  The patient's history has been reviewed, patient examined, no change in status, stable for surgery.  I have reviewed the patient's chart and labs.  Questions were answered to the patient's satisfaction.     Leotis Pain

## 2020-08-01 NOTE — Op Note (Signed)
[facility] Percutaneous Study/Intervention Procedural Note   Date of Surgery: 08/01/2020  Surgeon(s): Leotis Pain  Assistants:none  Pre-operative Diagnosis: Symptomatic uterine fibroids with pain  Post-operative diagnosis: Same  Procedure(s) Performed: 1. Ultrasound guidance for vascular access right femoral artery 2. Catheter placement into left uterine artery and right uterine artery from right femoral approach 3. Aortogram and selective pelvic arteriograms including selective imaging of both uterine arteries 4. Micro-bead embolization with 10 cc of 500-700  polyvinyl alcohol beads to the left uterine artery and coil embolization with a 4 mm x 35 cm length Ruby coils, a 30 cm pod 4 coil and a 30 cm packing coil 5. Micro-bead embolization with 10 cc of 500 to 700   polyvinyl alcohol beads to the right uterine artery and coil embolization with a 4 mm diameter by 30 cm length standard Ruby coil and a 30 cm pod 4 coil  6. StarClose closure device right femoral artery  EBL: 10 cc  Contrast: 75 cc  Fluoro Time: 21 min  Moderate Conscious Sedation time: approximately 75 minutes using 6 mg of Versed and 150 mcg of Fentanyl  Indications: Patient is a 40 y.o. female with pain from uterine fibroids. She is interested in having uterine artery embolization to treat her fibroids. Risks and benefits are discussed and informed consent is obtained  Procedure: The patient was identified and appropriate procedural time out was performed. The patient was then placed supine on the table and prepped and draped in the usual sterile fashion. Moderate conscious sedation was administered throughout the procedure with a face to face encounter with the patient throughout and with my supervision of the RN administering medicines and monitoring the patients vital signs and mental status throughout from the  start of the procedure until the patient was taken to the recovery room.  Ultrasound was used to evaluate the right common femoral artery. It was patent . A digital ultrasound image was acquired. A Seldinger needle was used to access the right common femoral artery under direct ultrasound guidance and a permanent image was performed. A 0.035 J wire was advanced without resistance and a 5Fr sheath was placed.3000 units of heparin were given. Pigtail catheter was placed into the aorta and an AP aortogram was performed. This demonstrated normal renal arteries and normal aorta and iliac segments without significant stenosis.  There were 2 right renal arteries.  On the initial imaging, significant flow through larger than average uterine arteries was identified and on the initial aortogram. I then crossed the aortic bifurcation and advanced to the left iliac bifurcation. Using a rim catheter, I was able to cannulate the hypogastric artery on the left. Selective imaging was performed of the hypogastric artery which demonstrated uterine artery to be a tertiary branch after the primary bifurcation and it was a secondary branch off of the larger of the 2 internal iliac artery branches going medially with a very steep angle.  This was very difficult to cannulate. Using a rim catheter and then a prograde micro catheter, I was able to cannulate the left uterine artery and advance beyond its initial branches well into the left uterine artery.  Selective imaging was performed. I then performed left uterine artery embolization using approximately 10 cc of 500-700  polyvinyl alcohol beads into the left uterine artery for selective embolization that significantly decreased the forward flow.  I then used a total of 3 the first was a 4 mm diameter by 35 cm length standard coil, the  second was a 30 cm long pod 4 and then the final coil on the left was a 30 cm long packing coil.  Imaging through the rim catheter in the left  internal iliac artery showed essentially no flow in the left uterine artery following embolization.  The microcatheter was then removed. I then turned my attention to the right uterine artery. I was able to cannulate the right hypogastric artery with the V S1 catheter from the right femoral approach and using a Glidewire advanced this into the main right hypogastric artery. Selective imaging was performed in the right hypogastric artery which demonstrated the right uterine artery actually to be the first branch off of the internal iliac artery above the primary bifurcation.  I was able to advance the pro-great microcatheter into the uterine artery and down beyond its primary branches near where it entered the uterus itself. Selective imaging was performed. Approximately 10 cc of the 500 700  polyvinyl alcohol beads were then instilled into the right uterine artery for selective embolization until there was decreased forward flow.  I then used a 4 mm diameter by 30 cm length standard coil and a 30 cm length pod 4 Ruby coil to completely embolize the right uterine artery.  The microcatheter was removed and imaging through the V S1 catheter showed essentially no forward flow through the right uterine artery. The diagnostic catheter was then removed. I elected to terminate the procedure. The sheath was removed and StarClose closure device was deployed in the right femoral artery with excellent hemostatic result. The patient was taken to the recovery room in stable condition having tolerated the procedure well.      Disposition: Patient was taken to the recovery room in stable condition having tolerated the procedure well.  Complications: None

## 2020-08-02 ENCOUNTER — Encounter: Payer: Self-pay | Admitting: Family Medicine

## 2020-08-02 DIAGNOSIS — Z8619 Personal history of other infectious and parasitic diseases: Secondary | ICD-10-CM | POA: Diagnosis not present

## 2020-08-02 DIAGNOSIS — D252 Subserosal leiomyoma of uterus: Secondary | ICD-10-CM | POA: Diagnosis not present

## 2020-08-02 DIAGNOSIS — Z8249 Family history of ischemic heart disease and other diseases of the circulatory system: Secondary | ICD-10-CM | POA: Diagnosis not present

## 2020-08-02 DIAGNOSIS — D259 Leiomyoma of uterus, unspecified: Secondary | ICD-10-CM

## 2020-08-02 DIAGNOSIS — Z79899 Other long term (current) drug therapy: Secondary | ICD-10-CM | POA: Diagnosis not present

## 2020-08-02 DIAGNOSIS — I10 Essential (primary) hypertension: Secondary | ICD-10-CM | POA: Diagnosis not present

## 2020-08-02 DIAGNOSIS — Z882 Allergy status to sulfonamides status: Secondary | ICD-10-CM | POA: Diagnosis not present

## 2020-08-02 DIAGNOSIS — Z87891 Personal history of nicotine dependence: Secondary | ICD-10-CM | POA: Diagnosis not present

## 2020-08-02 DIAGNOSIS — Z20822 Contact with and (suspected) exposure to covid-19: Secondary | ICD-10-CM | POA: Diagnosis not present

## 2020-08-02 MED ORDER — OXYCODONE-ACETAMINOPHEN 5-325 MG PO TABS: 1.0000 | ORAL_TABLET | ORAL | 0 refills | Status: DC | PRN

## 2020-08-02 MED ORDER — KETOROLAC TROMETHAMINE 10 MG PO TABS
10.0000 mg | ORAL_TABLET | Freq: Once | ORAL | Status: AC
  Administered 2020-08-02: 10 mg via ORAL
  Filled 2020-08-02: qty 1

## 2020-08-02 NOTE — Progress Notes (Signed)
Sabrina Smith to be D/C'd home per MD order. Discussed with the patient and all questions fully answered.  Skin clean, dry and intact without evidence of skin break down, no evidence of skin tears noted. Groin site dry and intact. IV catheter discontinued intact. Site without signs and symptoms of complications. Dressing and pressure applied.  An After Visit Summary was printed and given to the patient.  Patient escorted via [place], and D/C home via private auto.  Sabrina Smith  08/02/2020 12:43 PM

## 2020-08-02 NOTE — Discharge Summary (Signed)
[facility]    Discharge Summary  Patient ID:  Sabrina Smith MRN: [id number] DOB/AGE: 1980-07-03 40 y.o.  Admit date: 08/01/2020 Discharge date: 08/02/2020 Date of Surgery: 08/01/2020 Surgeon: Surgeon(s): Lucky Cowboy Erskine Squibb, MD  Admission Diagnosis: Uterine fibroid [D25.9]  Discharge Diagnoses:  Uterine fibroid [D25.9]  Secondary Diagnoses: Past Medical History:  Diagnosis Date  . Allergic rhinitis    severe ([place])  . Asthma    as a child  . History of chicken pox   . Hypertension   . Shingles 1990s   Procedure(s): 08/01/20: 1. Ultrasound guidance for vascular access right femoral artery 2. Catheter placement into left uterine artery and right uterine artery from right femoral approach 3. Aortogram and selective pelvic arteriograms including selective imaging of both uterine arteries 4. Micro-bead embolization with 10 cc of 500-700  polyvinyl alcohol beads to the left uterine artery and coil embolization with a 4 mm x 35 cm length Ruby coils, a 30 cm pod 4 coil and a 30 cm packing coil 5. Micro-bead embolization with 10 cc of 500 to 700   polyvinyl alcohol beads to the right uterine artery and coil embolization with a 4 mm diameter by 30 cm length standard Ruby coil and a 30 cm pod 4 coil             6. StarClose closure device right femoral artery  Discharged Condition: Good  HPI / Hospital Course:  Patient is a 40 year old female with pain from uterine fibroids. She is interested in having uterine artery embolization to treat her fibroids. Risks and benefits are discussed and informed consent is obtained. On 08/01/20 the patient underwent:  1. Ultrasound guidance for vascular access right femoral artery 2. Catheter placement into left uterine artery and right uterine artery from right femoral approach 3. Aortogram and selective pelvic arteriograms  including selective imaging of both uterine arteries 4. Micro-bead embolization with 10 cc of 500-700  polyvinyl alcohol beads to the left uterine artery and coil embolization with a 4 mm x 35 cm length Ruby coils, a 30 cm pod 4 coil and a 30 cm packing coil 5. Micro-bead embolization with 10 cc of 500 to 700   polyvinyl alcohol beads to the right uterine artery and coil embolization with a 4 mm diameter by 30 cm length standard Ruby coil and a 30 cm pod 4 coil             6. StarClose closure device right femoral artery  The patient tolerated the procedure well was transferred from the angiography suite to the surgical floor without issue. Patient's night of surgery was unremarkable. Patient's discomfort was controlled to the use of a PCA this was transitioned to PO pain medication post-op day #1.  During the rest of her stay, her diet was advanced, she was urinating independently and she was ambulating at baseline. Day of discharge, the patient was afebrile with stable vital signs.  Physical Exam:  Alert and oriented x3, No acute distress Cardiovascular: Regular rate and rhythm Pulmonary: Clear to auscultation bilaterally Abdomen: Soft, non-tender, non-distended, (+) bowel sounds Access site:  Right groin: Clean dry and intact. No swelling or drainage. Extremities: Warm distally toes. Good capillary refill.  Labs: As below  Complications: None  Consults: None  Significant Diagnostic Studies: CBC Lab Results  Component Value Date   WBC 11.2 (H) 08/01/2020   HGB 14.4 08/01/2020   HCT 43.0 08/01/2020   MCV 85.7 08/01/2020   PLT  265 08/01/2020   BMET    Component Value Date/Time   NA 137 08/01/2020 1150   NA 142 09/30/2019 1132   K 3.9 08/01/2020 1150   CL 106 08/01/2020 1150   CO2 24 08/01/2020 1150   GLUCOSE 131 (H) 08/01/2020 1150   BUN 9 08/01/2020 1150   BUN 10 09/30/2019 1132   CREATININE 0.67 08/01/2020 1150   CALCIUM 8.6 (L) 08/01/2020  1150   GFRNONAA >60 08/01/2020 1150   GFRAA 121 09/30/2019 1132   COAG Lab Results  Component Value Date   INR 1.0 08/01/2020   Disposition:  Discharge to :Home  Allergies as of 08/02/2020      Reactions   Sulfa Antibiotics Hives   Other    Soybean, Almond, Hazelnut   Sulfa Drugs Cross Reactors Hives      Medication List    TAKE these medications   diltiazem 120 MG 24 hr capsule Commonly known as: TIAZAC Take 120 mg by mouth daily.   Drysol 20 % external solution Generic drug: aluminum chloride Apply 1 application topically as needed.   glycopyrrolate 2 MG tablet Commonly known as: ROBINUL Take 2 mg by mouth daily.   omeprazole 40 MG capsule Commonly known as: PRILOSEC Take 1 capsule (40 mg total) by mouth daily as needed (heartburn).   oxyCODONE-acetaminophen 5-325 MG tablet Commonly known as: PERCOCET/ROXICET Take 1-2 tablets by mouth every 4 (four) hours as needed for moderate pain.   Xhance 93 MCG/ACT Exhu Generic drug: Fluticasone Propionate BLOW 2 DOSES IN EACH NOSTRIL TWICE DAILY What changed: See the new instructions.      Verbal and written Discharge instructions given to the patient. Wound care per Discharge AVS  Follow-up Information    Dew, Erskine Squibb, MD Follow up in 2 week(s).   Specialties: Vascular Surgery, Radiology, Interventional Cardiology Why: Can see Dew or [name].  No studies.  Uterine fibroid embolization follow-up. Contact information: [place] [address] [phone]              Signed: Sela Hua, PA-C 08/02/2020, 11:22 AM

## 2020-08-02 NOTE — Discharge Instructions (Signed)
You may shower. Gently clean your groins with soap and water. Gently pat dry. Please do not lift greater than 10 pounds or engage in strenuous activity until you are cleared at your postprocedure follow-up in our office. If you notice any drainage from the incision site please place a Band-Aid or a gauze to the area.

## 2020-08-03 ENCOUNTER — Telehealth (INDEPENDENT_AMBULATORY_CARE_PROVIDER_SITE_OTHER): Payer: Self-pay

## 2020-08-03 NOTE — Telephone Encounter (Signed)
She should stop the medication.  She should also take claritin (she can get this OTC) 10 mg twice a day (this is different from the instructions on the package).  Depending on her tolerance to Benadryl, she can take 50 mg every 6 to 8 hours.  It may take a few days for the hives to subside.  They may even spread a little while she is getting the medication in her system.  If it hasn't started to improve by Friday around noon, let us know.  If she feels she is having difficulty breathing, she should go directly to the ED

## 2020-08-03 NOTE — Telephone Encounter (Signed)
Patient was informed with medical advice and stated that she having fever and chills. The patient fever started hour ago with 100.3 and while on the phone the patient recheck her temp increased to 100.4.Spoke with Eulogio Ditch NP an she recommended for the patient to take Tylenol 650 mg every 6 hours as needed or she can go to the ED to be evaluated if symptoms are getting worse.

## 2020-08-08 ENCOUNTER — Encounter: Payer: Self-pay | Admitting: Vascular Surgery

## 2020-08-15 DIAGNOSIS — D259 Leiomyoma of uterus, unspecified: Secondary | ICD-10-CM

## 2020-08-16 ENCOUNTER — Ambulatory Visit (INDEPENDENT_AMBULATORY_CARE_PROVIDER_SITE_OTHER): Payer: Federal, State, Local not specified - PPO | Admitting: Nurse Practitioner

## 2020-08-19 ENCOUNTER — Ambulatory Visit (INDEPENDENT_AMBULATORY_CARE_PROVIDER_SITE_OTHER): Payer: Federal, State, Local not specified - PPO | Admitting: Vascular Surgery

## 2020-08-19 VITALS — BP 142/96 | HR 83 | Resp 16 | Wt 176.4 lb

## 2020-08-19 DIAGNOSIS — D252 Subserosal leiomyoma of uterus: Secondary | ICD-10-CM

## 2020-08-19 NOTE — Assessment & Plan Note (Signed)
She is still having some pelvic and low back pain although it may be a little better.  Her procedure for uterine artery embolization went well and was uncomplicated.  She is back to work and her access site is well-healed.  It may be useful to have a repeat ultrasound by her gynecologist in the next couple of months to evaluate the size of her uterus and fibroids.  I will plan to see her back in about 3 months.

## 2020-08-19 NOTE — Progress Notes (Signed)
MRN : [id number]  Sabrina Smith is a 40 y.o. (1980-05-28) female who presents with chief complaint of  Chief Complaint  Patient presents with   Follow-up    [facility] 2wk follow up  .  History of Present Illness: Patient returns today in follow up of uterine artery embolization for symptomatic fibroids.  She had pain for about 4 to 5 days after her procedure but has done well.  Her access site is well-healed.  She still notices occasional discomfort at the site.  No major issues or problems.  She is still having some of the pelvic and low back pain she was having before the procedure although it may be slightly better.  Current Outpatient Medications  Medication Sig Dispense Refill   diltiazem (TIAZAC) 120 MG 24 hr capsule Take 120 mg by mouth daily.     DRYSOL 20 % external solution Apply 1 application topically as needed.     glycopyrrolate (ROBINUL) 2 MG tablet Take 2 mg by mouth daily.     omeprazole (PRILOSEC) 40 MG capsule Take 1 capsule (40 mg total) by mouth daily as needed (heartburn). 30 capsule 3   oxyCODONE-acetaminophen (PERCOCET/ROXICET) 5-325 MG tablet Take 1-2 tablets by mouth every 4 (four) hours as needed for moderate pain. 50 tablet 0   XHANCE 93 MCG/ACT EXHU BLOW 2 DOSES IN EACH NOSTRIL TWICE DAILY (Patient taking differently: Place 2 sprays into both nostrils 2 (two) times daily.) 16 mL 0   No current facility-administered medications for this visit.    Past Medical History:  Diagnosis Date   Allergic rhinitis    severe ([place])   Asthma    as a child   History of chicken pox    Hypertension    Shingles 1990s    Past Surgical History:  Procedure Laterality Date   COLONOSCOPY  02/2018   hemorrhoids, WNL ([place])   EMBOLIZATION N/A 08/01/2020   Procedure: EMBOLIZATION;  Surgeon: Algernon Huxley, MD;  Location: [facility];  Service: Cardiovascular;  Laterality: N/A;     Social History   Tobacco Use   Smoking status: Former    Years: 2.00     Pack years: 0.00    Types: Cigarettes    Quit date: 2006    Years since quitting: 16.4   Smokeless tobacco: Never   Tobacco comments:    1cig daily  Vaping Use   Vaping Use: Never used  Substance Use Topics   Alcohol use: Yes    Alcohol/week: 1.0 standard drink    Types: 1 Glasses of wine per week    Comment: occassionally   Drug use: No       Family History  Problem Relation Age of Onset   Diabetes Maternal Aunt    Hypertension Maternal Aunt    Diabetes Maternal Uncle    Hypertension Maternal Uncle    Stroke Maternal Uncle    Heart failure Maternal Grandmother    Diabetes Maternal Grandfather    Cancer Maternal Grandfather        prostate   Hypertension Mother    Hypertension Brother    Asthma Daughter    Eczema Daughter    Heart failure Cousin    Allergic rhinitis Neg Hx    Urticaria Neg Hx      Allergies  Allergen Reactions   Sulfa Antibiotics Hives   Other     Soybean, Almond, Hazelnut   Sulfa Drugs Cross Reactors Hives     REVIEW OF  SYSTEMS (Negative unless checked)  Constitutional: [] Weight loss  [] Fever  [] Chills Cardiac: [] Chest pain   [] Chest pressure   [] Palpitations   [] Shortness of breath when laying flat   [] Shortness of breath at rest   [] Shortness of breath with exertion. Vascular:  [] Pain in legs with walking   [] Pain in legs at rest   [] Pain in legs when laying flat   [] Claudication   [] Pain in feet when walking  [] Pain in feet at rest  [] Pain in feet when laying flat   [] History of DVT   [] Phlebitis   [] Swelling in legs   [] Varicose veins   [] Non-healing ulcers Pulmonary:   [] Uses home oxygen   [] Productive cough   [] Hemoptysis   [] Wheeze  [] COPD   [] Asthma Neurologic:  [] Dizziness  [] Blackouts   [] Seizures   [] History of stroke   [] History of TIA  [] Aphasia   [] Temporary blindness   [] Dysphagia   [] Weakness or numbness in arms   [] Weakness or numbness in legs Musculoskeletal:  [] Arthritis   [] Joint swelling   [] Joint pain   [] Low back  pain Hematologic:  [] Easy bruising  [] Easy bleeding   [] Hypercoagulable state   [] Anemic   Gastrointestinal:  [] Blood in stool   [] Vomiting blood  [] Gastroesophageal reflux/heartburn   [x] Abdominal pain Genitourinary:  [] Chronic kidney disease   [] Difficult urination  [] Frequent urination  [] Burning with urination   [] Hematuria Skin:  [] Rashes   [] Ulcers   [] Wounds Psychological:  [] History of anxiety   []  History of major depression.  Physical Examination  BP (!) 142/96 (BP Location: Right Arm)   Pulse 83   Resp 16   Wt 176 lb 6.4 oz (80 kg)   BMI 32.26 kg/m  Gen:  WD/WN, NAD Head: [place]/AT, No temporalis wasting. Ear/Nose/Throat: Hearing grossly intact, nares w/o erythema or drainage Eyes: Conjunctiva clear. Sclera non-icteric Neck: Supple.  Trachea midline Pulmonary:  Good air movement, no use of accessory muscles.  Cardiac: RRR, no JVD Vascular:  Vessel Right Left  Radial Palpable Palpable                          PT Palpable Palpable  DP Palpable Palpable   Gastrointestinal: soft, non-tender/non-distended. No guarding/reflex.  Musculoskeletal: M/S 5/5 throughout.  No deformity or atrophy. No edema. Neurologic: Sensation grossly intact in extremities.  Symmetrical.  Speech is fluent.  Psychiatric: Judgment intact, Mood & affect appropriate for pt's clinical situation. Dermatologic: No rashes or ulcers noted.  No cellulitis or open wounds.      Labs Recent Results (from the past 2160 hour(s))  Urine Culture     Status: None   Collection Time: 06/01/20  3:17 PM   Specimen: Urine, Clean Catch   UR  Result Value Ref Range   Urine Culture, Routine Final report    Organism ID, Bacteria Lactobacillus species     Comment: 25,000-50,000 colony forming units per mL Susceptibility not normally performed on this organism.   POCT Urinalysis Dipstick     Status: Normal   Collection Time: 06/01/20  4:12 PM  Result Value Ref Range   Color, UA yellow    Clarity, UA clear     Glucose, UA Negative Negative   Bilirubin, UA neg    Ketones, UA neg    Spec Grav, UA 1.020 1.010 - 1.025   Blood, UA neg    pH, UA 5.0 5.0 - 8.0   Protein, UA Negative Negative   Urobilinogen, UA  Nitrite, UA neg    Leukocytes, UA Negative Negative   Appearance     Odor    POCT Urinalysis Dipstick     Status: Normal   Collection Time: 06/22/20 10:28 AM  Result Value Ref Range   Color, UA yellow    Clarity, UA clear    Glucose, UA Negative Negative   Bilirubin, UA neg    Ketones, UA neg    Spec Grav, UA 1.020 1.010 - 1.025   Blood, UA trace    pH, UA 6.0 5.0 - 8.0   Protein, UA Negative Negative   Urobilinogen, UA 0.2 0.2 or 1.0 E.U./dL   Nitrite, UA neg    Leukocytes, UA Negative Negative   Appearance normal    Odor normal   BUN     Status: None   Collection Time: 08/01/20  7:26 AM  Result Value Ref Range   BUN 11 6 - 20 mg/dL    Comment: Performed at [facility], [address] [place], [place] [address]  Creatinine, serum     Status: None   Collection Time: 08/01/20  7:26 AM  Result Value Ref Range   Creatinine, Ser 0.75 0.44 - 1.00 mg/dL   GFR, Estimated >60 >60 mL/min    Comment: (NOTE) Calculated using the CKD-EPI Creatinine Equation (2021) Performed at [facility], [place], [place] [address]   HIV Antibody (routine testing w rflx)     Status: None   Collection Time: 08/01/20 11:50 AM  Result Value Ref Range   HIV Screen 4th Generation wRfx Non Reactive Non Reactive    Comment: Performed at [facility] [address], [place], [place] [address]  CBC     Status: Abnormal   Collection Time: 08/01/20 11:50 AM  Result Value Ref Range   WBC 11.2 (H) 4.0 - 10.5 K/uL   RBC 5.02 3.87 - 5.11 MIL/uL   Hemoglobin 14.4 12.0 - 15.0 g/dL   HCT 43.0 36.0 - 46.0 %   MCV 85.7 80.0 - 100.0 fL   MCH 28.7 26.0 - 34.0 pg   MCHC 33.5 30.0 - 36.0 g/dL   RDW 14.7 11.5 - 15.5 %   Platelets 265 150 - 400 K/uL   nRBC 0.0 0.0  - 0.2 %    Comment: Performed at [facility], [address] [place], [place] [address]  Comprehensive metabolic panel     Status: Abnormal   Collection Time: 08/01/20 11:50 AM  Result Value Ref Range   Sodium 137 135 - 145 mmol/L   Potassium 3.9 3.5 - 5.1 mmol/L   Chloride 106 98 - 111 mmol/L   CO2 24 22 - 32 mmol/L   Glucose, Bld 131 (H) 70 - 99 mg/dL    Comment: Glucose reference range applies only to samples taken after fasting for at least 8 hours.   BUN 9 6 - 20 mg/dL   Creatinine, Ser 0.67 0.44 - 1.00 mg/dL   Calcium 8.6 (L) 8.9 - 10.3 mg/dL   Total Protein 7.2 6.5 - 8.1 g/dL   Albumin 4.2 3.5 - 5.0 g/dL   AST 15 15 - 41 U/L   ALT 18 0 - 44 U/L   Alkaline Phosphatase 47 38 - 126 U/L   Total Bilirubin 0.6 0.3 - 1.2 mg/dL   GFR, Estimated >60 >60 mL/min    Comment: (NOTE) Calculated using the CKD-EPI Creatinine Equation (2021)    Anion gap 7 5 - 15  Comment: Performed at [facility], [address] [place], [place] [address]  Protime-INR     Status: None   Collection Time: 08/01/20 11:50 AM  Result Value Ref Range   Prothrombin Time 13.1 11.4 - 15.2 seconds   INR 1.0 0.8 - 1.2    Comment: (NOTE) INR goal varies based on device and disease states. Performed at [facility], [address] [place], [place] [address]   SARS Coronavirus 2 by RT PCR (hospital order, performed in [facility] hospital lab)     Status: None   Collection Time: 08/01/20 11:50 AM  Result Value Ref Range   SARS Coronavirus 2 NEGATIVE NEGATIVE    Comment: (NOTE) SARS-CoV-2 target nucleic acids are NOT DETECTED.  The SARS-CoV-2 RNA is generally detectable in upper and lower respiratory specimens during the acute phase of infection. The lowest concentration of SARS-CoV-2 viral copies this assay can detect is 250 copies / mL. A negative result does not preclude SARS-CoV-2 infection and should not be used as the sole basis for treatment or other patient  management decisions.  A negative result may occur with improper specimen collection / handling, submission of specimen other than nasopharyngeal swab, presence of viral mutation(s) within the areas targeted by this assay, and inadequate number of viral copies (<250 copies / mL). A negative result must be combined with clinical observations, patient history, and epidemiological information.  Fact Sheet for Patients:   StrictlyIdeas.no  Fact Sheet for Healthcare Providers: BankingDealers.co.za  This test is not yet approved or  cleared by the [place] FDA and has been authorized for detection and/or diagnosis of SARS-CoV-2 by FDA under an Emergency Use Authorization (EUA).  This EUA will remain in effect (meaning this test can be used) for the duration of the COVID-19 declaration under Section 564(b)(1) of the Act, 21 U.S.C. section 360bbb-3(b)(1), unless the authorization is terminated or revoked sooner.  Performed at [facility], [address] [place], [place] [address]     Radiology PERIPHERAL VASCULAR CATHETERIZATION  Result Date: 08/01/2020 See surgical note for result.   Assessment/Plan Essential hypertension blood pressure control important in reducing the progression of atherosclerotic disease. On appropriate oral medications.  Uterine fibroid She is still having some pelvic and low back pain although it may be a little better.  Her procedure for uterine artery embolization went well and was uncomplicated.  She is back to work and her access site is well-healed.  It may be useful to have a repeat ultrasound by her gynecologist in the next couple of months to evaluate the size of her uterus and fibroids.  I will plan to see her back in about 3 months.    Leotis Pain, MD  08/19/2020 10:22 AM    This note was created with Dragon medical transcription system.  Any errors from dictation are purely  unintentional

## 2020-09-13 IMAGING — US US ABDOMEN LIMITED
1 series · 14 of 25 positions shown · non-contrast
Comparison: None.

CLINICAL DATA: Epigastric pain since this morning.

EXAM:
ULTRASOUND ABDOMEN LIMITED RIGHT UPPER QUADRANT

[Series 1: us abdomen limited · 0.23mm/px · 14 of 44 slices shown]
[im 1/44]
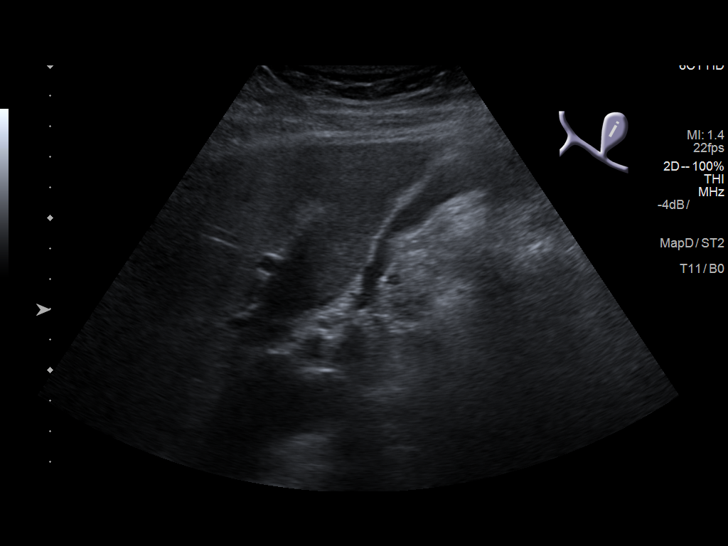
[im 4/44]
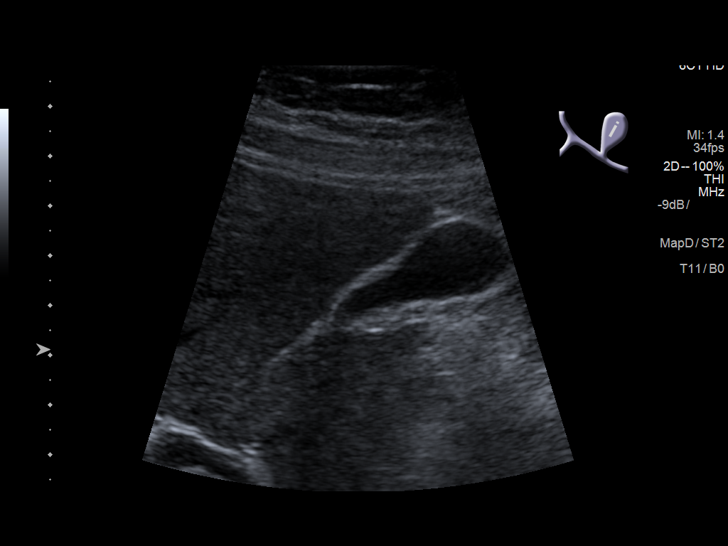
[im 8/44]
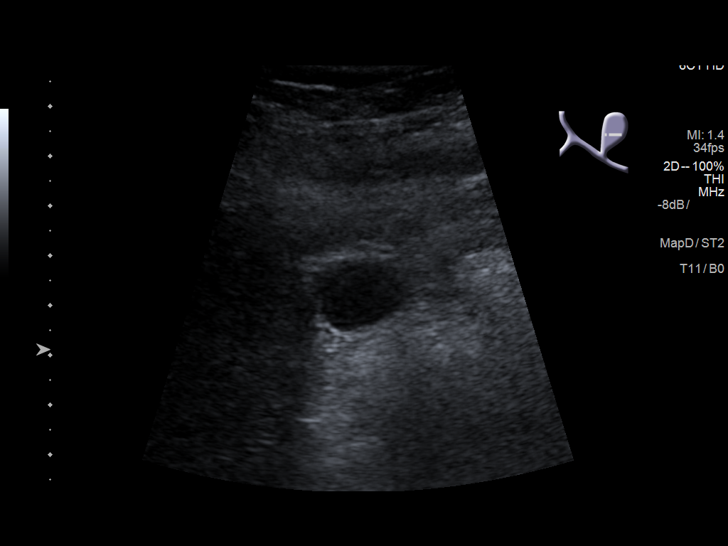
[im 11/44]
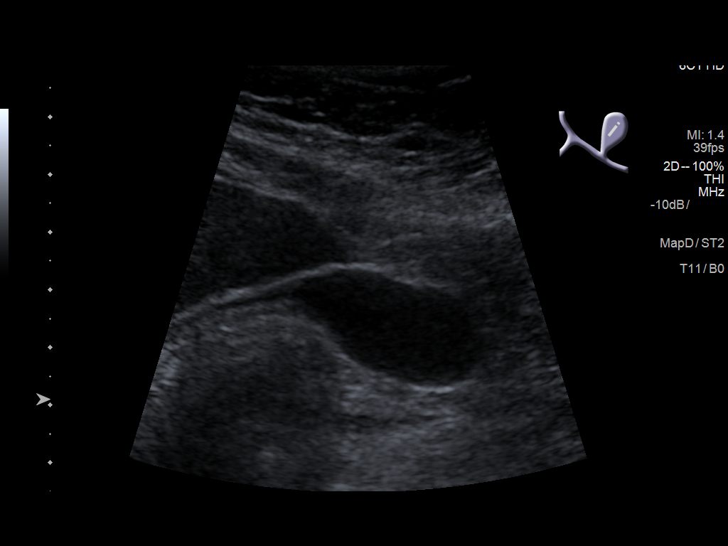
[im 15/44]
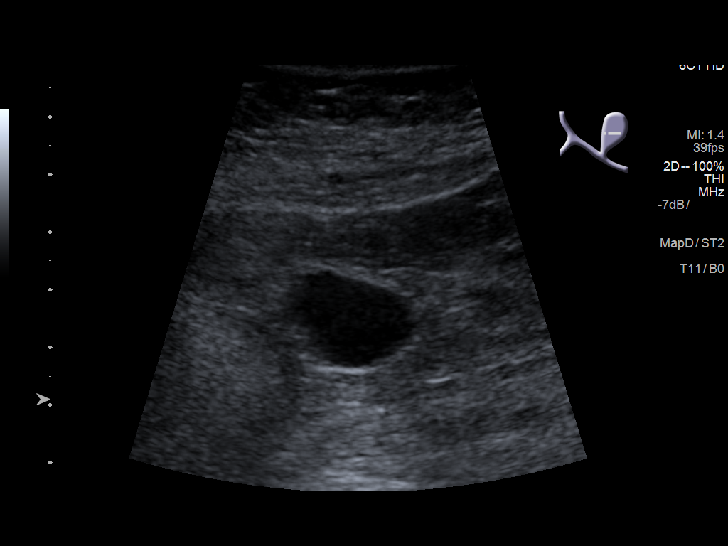
[im 17/44]
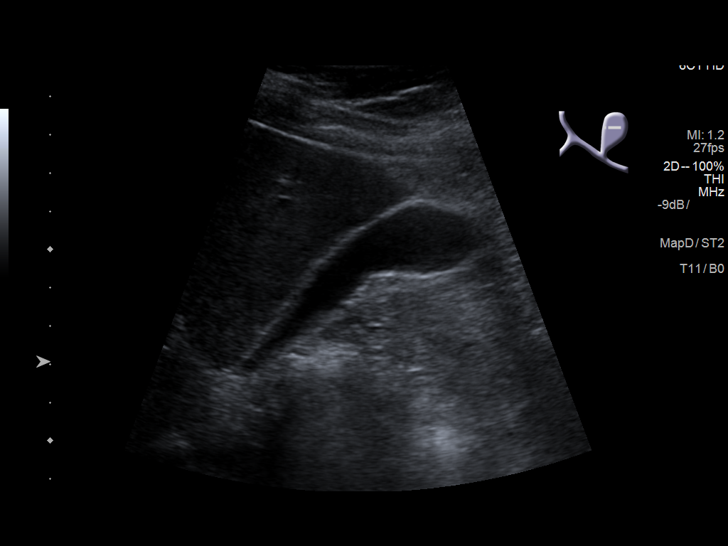
[im 20/44]
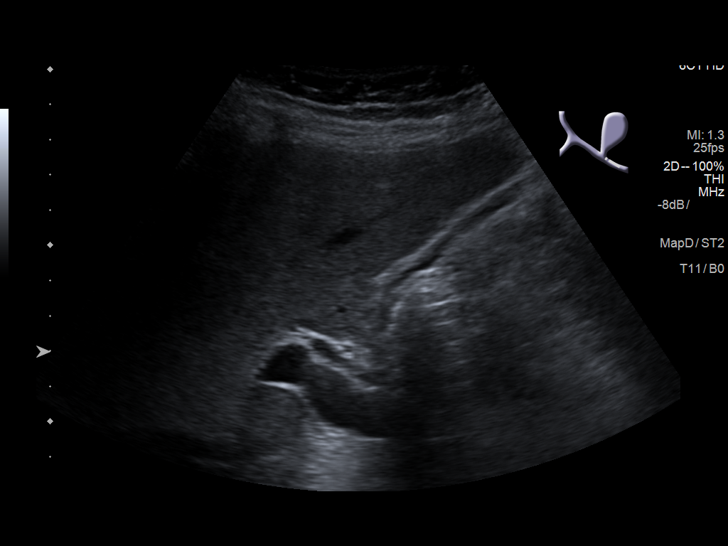
[im 24/44]
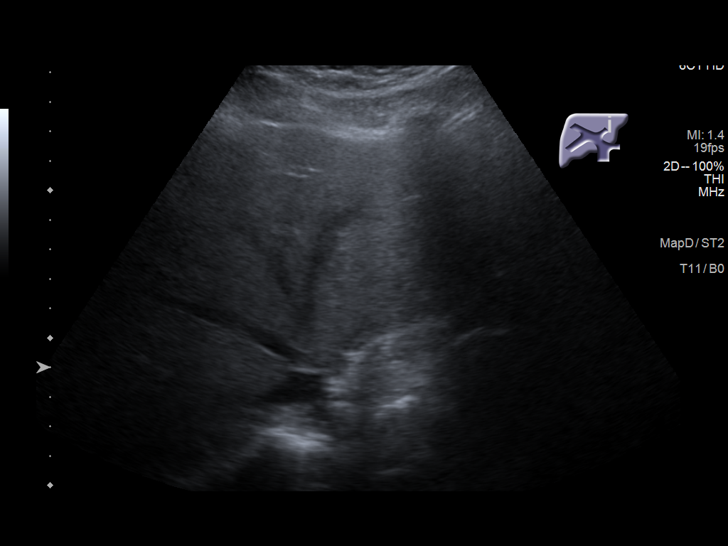
[im 27/44]
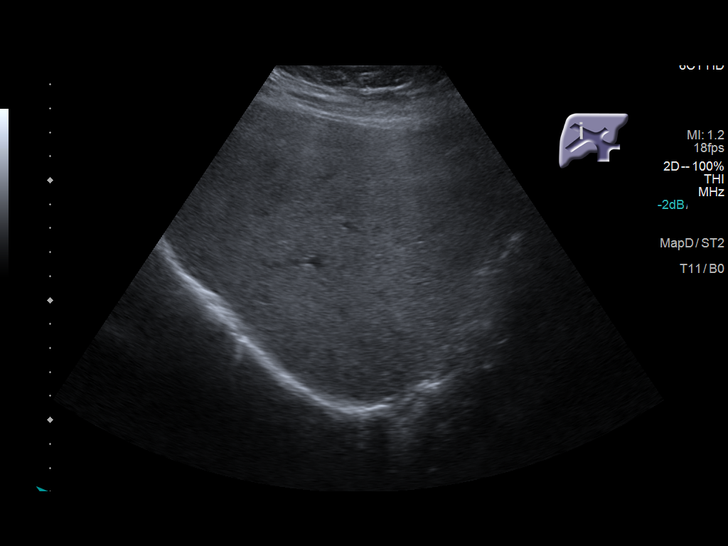
[im 29/44]
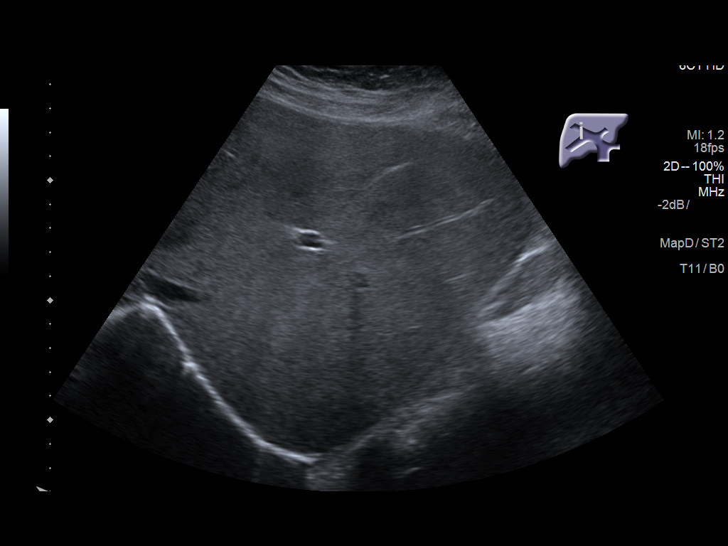
[im 33/44]
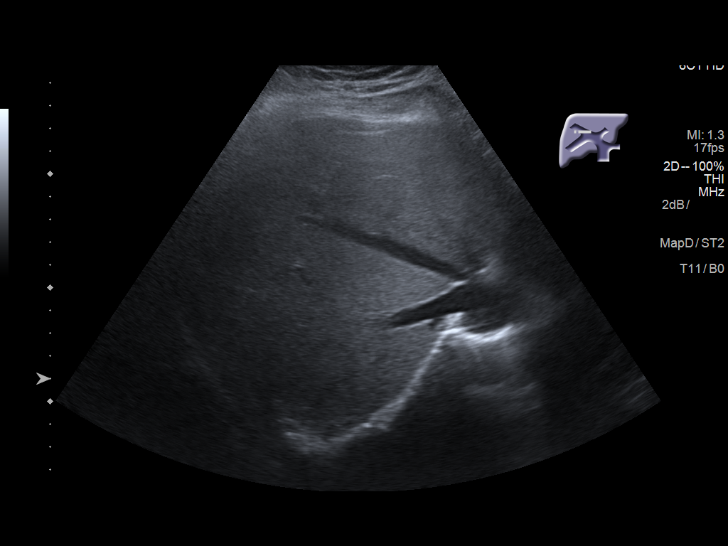
[im 36/44]
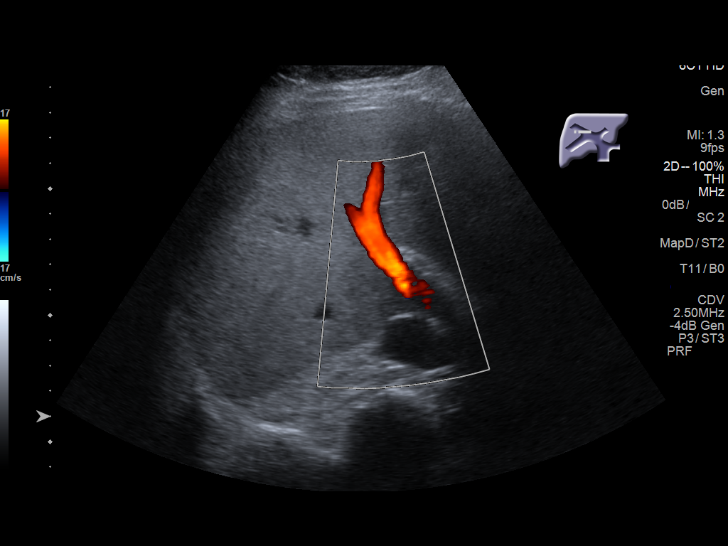
[im 40/44]
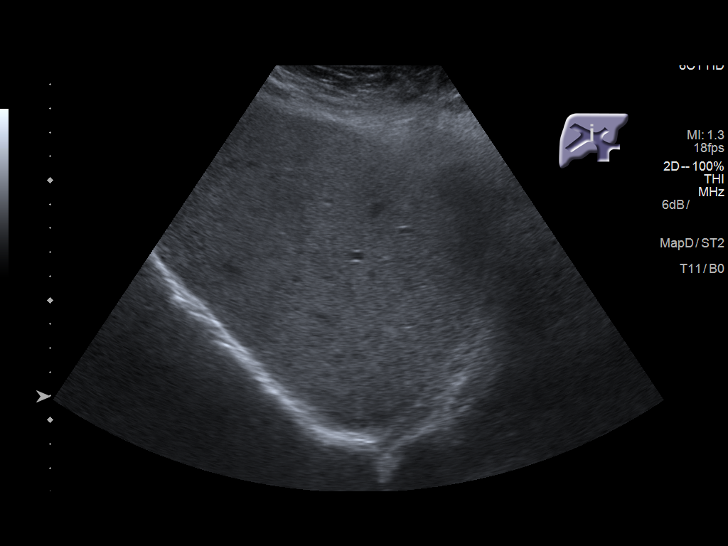
[im 44/44]
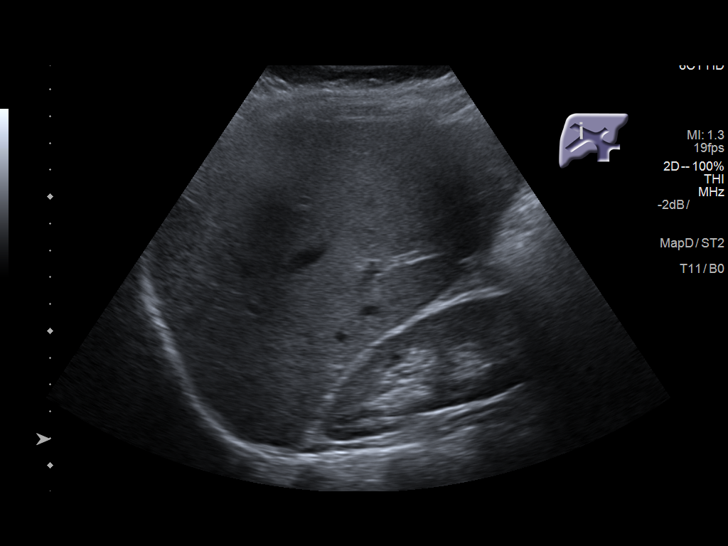

[14 of 25 positions shown; findings below may reference images not displayed]

FINDINGS: Gallbladder:

No gallstones or wall thickening visualized. No sonographic Murphy
sign noted by sonographer.

Common bile duct:

Diameter: 3.2 mm, normal

Liver:

No focal lesion identified. Within normal limits in parenchymal
echogenicity. Portal vein is patent on color Doppler imaging with
normal direction of blood flow towards the liver.
IMPRESSION: Normal examination.

## 2020-11-11 ENCOUNTER — Ambulatory Visit (INDEPENDENT_AMBULATORY_CARE_PROVIDER_SITE_OTHER): Payer: Federal, State, Local not specified - PPO | Admitting: Vascular Surgery

## 2020-11-18 ENCOUNTER — Ambulatory Visit (INDEPENDENT_AMBULATORY_CARE_PROVIDER_SITE_OTHER): Payer: 59 | Admitting: Obstetrics & Gynecology

## 2020-11-18 ENCOUNTER — Encounter: Payer: Self-pay | Admitting: Obstetrics & Gynecology

## 2020-11-18 ENCOUNTER — Other Ambulatory Visit: Payer: Self-pay

## 2020-11-18 VITALS — BP 110/60 | Ht 62.0 in | Wt 180.0 lb

## 2020-11-18 DIAGNOSIS — R102 Pelvic and perineal pain: Secondary | ICD-10-CM

## 2020-11-18 DIAGNOSIS — D219 Benign neoplasm of connective and other soft tissue, unspecified: Secondary | ICD-10-CM

## 2020-11-18 NOTE — Progress Notes (Signed)
Uterine Fibroids Patient is a 40 yo G2P1011 AA F who presents with a history of uterine fibroids. Periods are regular every 28-30 days, lasting 7 days. Dysmenorrhea:mild, occurring throughout menses. Cyclic symptoms include none. No intermenstrual bleeding, spotting, or discharge.   She had [place] in June 2022. Noted pain right after procedure.  No change in periods after procedure.  Recent onset urinary frequency for the last 2 weeks intermittently.  Also mild 1/10 RLQ pain at times.  No dysuria, nocturia, bleeding.  PMHx: She  has a past medical history of Allergic rhinitis, Asthma, History of chicken pox, Hypertension, and Shingles (1990s). Also,  has a past surgical history that includes Colonoscopy (02/2018) and EMBOLIZATION (N/A, 08/01/2020)., family history includes Asthma in her daughter; Cancer in her maternal grandfather; Diabetes in her maternal aunt, maternal grandfather, and maternal uncle; Eczema in her daughter; Heart failure in her cousin and maternal grandmother; Hypertension in her brother, maternal aunt, maternal uncle, and mother; Stroke in her maternal uncle.,  reports that she quit smoking about 16 years ago. Her smoking use included cigarettes. She has never used smokeless tobacco. She reports current alcohol use of about 1.0 standard drink per week. She reports that she does not use drugs.  She has a current medication list which includes the following prescription(s): diltiazem and xhance. Also, is allergic to sulfa antibiotics, other, and sulfa drugs cross reactors.  Review of Systems  Constitutional:  Negative for chills, fever and malaise/fatigue.  HENT:  Negative for congestion, sinus pain and sore throat.   Eyes:  Negative for blurred vision and pain.  Respiratory:  Negative for cough and wheezing.   Cardiovascular:  Negative for chest pain and leg swelling.  Gastrointestinal:  Positive for abdominal pain. Negative for constipation, diarrhea, heartburn, nausea and  vomiting.  Genitourinary:  Positive for frequency. Negative for dysuria, hematuria and urgency.  Musculoskeletal:  Negative for back pain, joint pain, myalgias and neck pain.  Skin:  Negative for itching and rash.  Neurological:  Negative for dizziness, tremors and weakness.  Endo/Heme/Allergies:  Does not bruise/bleed easily.  Psychiatric/Behavioral:  Negative for depression. The patient is not nervous/anxious and does not have insomnia.    Objective: BP 110/60   Ht 5\' 2"  (1.575 m)   Wt 180 lb (81.6 kg)   LMP 10/31/2020   BMI 32.92 kg/m  Physical Exam Constitutional:      General: She is not in acute distress.    Appearance: She is well-developed.  Genitourinary:     Right Labia: No rash or tenderness.    Left Labia: No tenderness or rash.    No vaginal erythema or bleeding.      Right Adnexa: not tender and no mass present.    Left Adnexa: not tender and no mass present.    No cervical motion tenderness, discharge, polyp or nabothian cyst.     Uterus is not enlarged.     Uterine mass present.    Uterus exam comments: Anterior mass (fibroid, as feel it secondary to uterus as well), could be post fibroid and anterior uterine body 8 week size overall.     Pelvic exam was performed with patient in the lithotomy position.  HENT:     Head: Normocephalic and atraumatic.     Nose: Nose normal.  Abdominal:     General: There is no distension.     Palpations: Abdomen is soft.     Tenderness: There is no abdominal tenderness.  Musculoskeletal:  General: Normal range of motion.  Neurological:     Mental Status: She is alert and oriented to person, place, and time.     Cranial Nerves: No cranial nerve deficit.  Skin:    General: Skin is warm and dry.  Psychiatric:        Attention and Perception: Attention normal.        Mood and Affect: Mood and affect normal.        Speech: Speech normal.        Behavior: Behavior normal.        Thought Content: Thought content normal.         Judgment: Judgment normal.   Prior US 2021    5 cm post fibroid, 1 cm ant fibroid, also lateral fibroid  ASSESSMENT/PLAN:   Problem List Items Addressed This Visit     Fibroids    -  Primary   Pelvic pain       [place] to re-assess fibroid since procedure 3 mos ago and due to these sx's No s/sx UTI by UA today Discussed options for fibroids  Barnett Applebaum, MD, [facility], [facility] 11/18/2020  8:54 AM

## 2020-11-28 ENCOUNTER — Other Ambulatory Visit: Payer: Self-pay

## 2020-11-28 ENCOUNTER — Ambulatory Visit
Admission: RE | Admit: 2020-11-28 | Discharge: 2020-11-28 | Disposition: A | Discharge status: Home / Self Care | Payer: 59 | Source: Ambulatory Visit | Attending: Obstetrics & Gynecology | Admitting: Obstetrics & Gynecology

## 2020-11-28 DIAGNOSIS — D219 Benign neoplasm of connective and other soft tissue, unspecified: Secondary | ICD-10-CM | POA: Diagnosis not present

## 2020-11-28 DIAGNOSIS — R102 Pelvic and perineal pain: Secondary | ICD-10-CM | POA: Insufficient documentation

## 2020-11-29 ENCOUNTER — Ambulatory Visit (INDEPENDENT_AMBULATORY_CARE_PROVIDER_SITE_OTHER): Payer: 59 | Admitting: Obstetrics & Gynecology

## 2020-11-29 ENCOUNTER — Encounter: Payer: Self-pay | Admitting: Obstetrics & Gynecology

## 2020-11-29 VITALS — BP 100/60 | Ht 62.0 in | Wt 184.0 lb

## 2020-11-29 DIAGNOSIS — R102 Pelvic and perineal pain: Secondary | ICD-10-CM | POA: Diagnosis not present

## 2020-11-29 DIAGNOSIS — D219 Benign neoplasm of connective and other soft tissue, unspecified: Secondary | ICD-10-CM

## 2020-11-29 NOTE — Progress Notes (Signed)
HPI: Pt has been having intermittent lower abdominal pains, worse at times w urination.  Feels like bladder pressure.  Recently this improved.  No UTI by last check.  [place] for assessment of fibroids.  She had [place] in July 2022.  Ultrasound demonstrates fibroids, see below; there is noted decrease in size/volume  PMHx: She  has a past medical history of Allergic rhinitis, Asthma, History of chicken pox, Hypertension, and Shingles (1990s). Also,  has a past surgical history that includes Colonoscopy (02/2018) and EMBOLIZATION (N/A, 08/01/2020)., family history includes Asthma in her daughter; Cancer in her maternal grandfather; Diabetes in her maternal aunt, maternal grandfather, and maternal uncle; Eczema in her daughter; Heart failure in her cousin and maternal grandmother; Hypertension in her brother, maternal aunt, maternal uncle, and mother; Stroke in her maternal uncle.,  reports that she quit smoking about 16 years ago. Her smoking use included cigarettes. She has never used smokeless tobacco. She reports current alcohol use of about 1.0 standard drink per week. She reports that she does not use drugs.  She has a current medication list which includes the following prescription(s): diltiazem and xhance. Also, is allergic to sulfa antibiotics, other, and sulfa drugs cross reactors.  Review of Systems  All other systems reviewed and are negative.  Objective: BP 100/60   Ht 5\' 2"  (1.575 m)   Wt 184 lb (83.5 kg)   LMP 10/31/2020   BMI 33.65 kg/m   Physical examination Constitutional NAD, Conversant  Skin No rashes, lesions or ulceration.   Extremities: Moves all appropriately.  Normal ROM for age. No lymphadenopathy.  Neuro: Grossly intact  Psych: Oriented to PPT.  Normal mood. Normal affect.   US PELVIC COMPLETE WITH TRANSVAGINAL  Result Date: 11/28/2020 CLINICAL DATA:  Initial evaluation for uterine fibroids, pelvic pain. EXAM: TRANSABDOMINAL AND TRANSVAGINAL ULTRASOUND OF PELVIS  TECHNIQUE: Both transabdominal and transvaginal ultrasound examinations of the pelvis were performed. Transabdominal technique was performed for global imaging of the pelvis including uterus, ovaries, adnexal regions, and pelvic cul-de-sac. It was necessary to proceed with endovaginal exam following the transabdominal exam to visualize the uterus, endometrium, and ovaries. COMPARISON:  Prior ultrasound from 06/27/2020. FINDINGS: Uterus Measurements: 7.0 x 6.6 x 4.9 cm = volume: 118 mL. Uterus is anteverted. Heterogeneous echotexture seen within the uterine myometrium. Two distinct uterine fibroids are seen on today's exam. 1. Subserosal fibroid at the posterior uterine body measures 4.4 x 2.9 x 3.2 cm, previously 5.4 x 5.0 x 3.2 cm 2. Intramural to subserosal fibroid at the left lower uterine segment measures 3.0 x 2.2 x 2.4 cm, previously 4.1 x 3.9 x 3.1 cm Previously seen additional third right-sided subserosal fibroid not definitely seen on today's exam. Endometrium Thickness: 5 mm.  No focal abnormality visualized. Right ovary Measurements: 2.9 x 1.4 x 1.4 cm = volume: 3.1 mL. Normal appearance/no adnexal mass. Left ovary Measurements: 2.7 x 1.6 x 1.7 cm = volume: 3.6 mL. Normal appearance/no adnexal mass. Other findings No abnormal free fluid. IMPRESSION: 1. Fibroid uterus as detailed above, overall decreased in size as compared to 06/27/2020. 2. Otherwise unremarkable pelvic ultrasound. Electronically Signed   By: Jeannine Boga M.D.   On: 11/28/2020 19:40    Assessment:  Fibroids  Pelvic pain  Improved pain.  Also improved fibroids based on [place] (shows shrinkage of fibroids at a considerable percentile since [place] 3 mos ago).  Pt reassured, and will monitor sx's.  Also consider dietary adjustments (as w IC).  Monitor periods over time.  A  total of 20 minutes were spent face-to-face with the patient as well as preparation, review, communication, and documentation during this encounter.   Barnett Applebaum, MD, [facility], [facility] 11/29/2020  9:59 AM

## 2021-05-26 ENCOUNTER — Other Ambulatory Visit: Payer: Self-pay | Admitting: Obstetrics & Gynecology

## 2021-05-26 DIAGNOSIS — Z1231 Encounter for screening mammogram for malignant neoplasm of breast: Secondary | ICD-10-CM

## 2021-06-30 ENCOUNTER — Ambulatory Visit
Admission: RE | Admit: 2021-06-30 | Discharge: 2021-06-30 | Disposition: A | Discharge status: Home / Self Care | Payer: 59 | Source: Ambulatory Visit | Attending: Obstetrics & Gynecology | Admitting: Obstetrics & Gynecology

## 2021-06-30 DIAGNOSIS — Z1231 Encounter for screening mammogram for malignant neoplasm of breast: Secondary | ICD-10-CM | POA: Insufficient documentation

## 2021-07-04 ENCOUNTER — Other Ambulatory Visit: Payer: Self-pay | Admitting: Family Medicine

## 2021-07-04 DIAGNOSIS — R928 Other abnormal and inconclusive findings on diagnostic imaging of breast: Secondary | ICD-10-CM

## 2021-07-04 DIAGNOSIS — N63 Unspecified lump in unspecified breast: Secondary | ICD-10-CM

## 2021-07-06 ENCOUNTER — Other Ambulatory Visit: Payer: Self-pay | Admitting: Obstetrics and Gynecology

## 2021-07-06 ENCOUNTER — Ambulatory Visit
Admission: RE | Admit: 2021-07-06 | Discharge: 2021-07-06 | Disposition: A | Discharge status: Home / Self Care | Payer: 59 | Source: Ambulatory Visit | Attending: Family Medicine | Admitting: Family Medicine

## 2021-07-06 ENCOUNTER — Encounter: Payer: Self-pay | Admitting: Obstetrics and Gynecology

## 2021-07-06 ENCOUNTER — Telehealth: Payer: Self-pay | Admitting: Internal Medicine

## 2021-07-06 DIAGNOSIS — R928 Other abnormal and inconclusive findings on diagnostic imaging of breast: Secondary | ICD-10-CM | POA: Insufficient documentation

## 2021-07-06 DIAGNOSIS — N63 Unspecified lump in unspecified breast: Secondary | ICD-10-CM

## 2021-07-06 NOTE — Telephone Encounter (Signed)
Noted  

## 2021-07-06 NOTE — Telephone Encounter (Signed)
Made 3 attempts to schedule removing from recall list. ?

## 2021-12-25 ENCOUNTER — Encounter (INDEPENDENT_AMBULATORY_CARE_PROVIDER_SITE_OTHER): Payer: Self-pay

## 2022-01-08 ENCOUNTER — Ambulatory Visit
Admission: RE | Admit: 2022-01-08 | Discharge: 2022-01-08 | Disposition: A | Discharge status: Home / Self Care | Payer: Medicaid Other | Source: Ambulatory Visit | Attending: Obstetrics and Gynecology | Admitting: Obstetrics and Gynecology

## 2022-01-08 DIAGNOSIS — R928 Other abnormal and inconclusive findings on diagnostic imaging of breast: Secondary | ICD-10-CM

## 2022-01-10 ENCOUNTER — Encounter: Payer: Self-pay | Admitting: Obstetrics and Gynecology

## 2022-04-05 NOTE — Progress Notes (Signed)
Cardiology Office Note    Date:  04/06/2022   ID:  Sabrina Smith, DOB 11/13/1980, MRN [id number]  PCP:  Ria Bush, MD  Cardiologist:  Nelva Bush, MD  Electrophysiologist:  None   Chief Complaint: Follow-up  History of Present Illness:   Sabrina Smith is a 42 y.o. female with history of HTN, palpitations, asthma, and GERD who presents for follow up of palpitations.    She was seen by Dr, End as a new patient for evaluation of palpitations on 09/30/2019. She had reported a 1-year history of palpitations, most notable when she was still.  She also noted atypical chest pain.  EKG with her PCP's office interpreted as sinus bradycardia with frequent PACs. EKG at her visit with Dr. Saunders Revel showed sinus rhythm with frequent premature junctional beats, borderline LVH, and poor R wave progression. There was no significant change when compared to study performed in her PCP's office. Echo showed an EF of 55-60%, no RWMA, normal LV diastolic function parameters, normal RVSF and ventricular cavity size, mild mitral regurgitation, and normal PASP. She was seen in follow up by Marrianne Mood, PA-C on 11/10/2019 and had decreased her caffeine intake, though continued to note palpitations. In this setting, she was started on Cardizem CD 120 mg. Beta blocker was deferred in the context of significant underlying allergies, for which she carried an EpiPen. With regards to fatigue, she was referred to pulmonology for sleep study.  In follow-up, in 10/2019, she did not note any significant change in her palpitation burden following the addition of Cardizem CD.  In this setting, she underwent Zio patch which demonstrated a predominant rhythm of sinus with an average heart rate of 84 bpm (range 55 to 162 bpm), rare PACs, no sustained arrhythmias or prolonged pauses, patient triggered events corresponded to sinus rhythm with PACs.  She was last seen in our office in 06/2020 and reported her palpitation burden has  essentially resolved.  She was on low-dose diltiazem.  Since we last saw her, she underwent uterine embolization in 07/2020 for uterine leiomyoma.  She comes in doing very well from a cardiac perspective and is without symptoms of angina or decompensation.  She has been out of diltiazem for several months.  Even with this, she has not had any palpitations.  No dyspnea, dizziness, presyncope, or syncope.  No falls.  No lower extremity swelling or progressive orthopnea.  She does note at times her blood pressure will trend into the 130s over low 90s off diltiazem.  Lowest blood pressure she has noted is in the 1 teens over 60s to 70s.  Drinking caffeine approximately 2 times per week, if that.  Overall, she feels like she is doing well.   Labs independently reviewed: 07/2020 - potassium 3.9, BUN 9, serum creatinine 0.67, BUN 4.2, AST/ALT normal, Hgb 14.4, PLT 265 12/2019 - TC 192, TG 82, HDL 54, LDL 121, magnesium 2.1 05/2019 - TSH normal  Past Medical History:  Diagnosis Date   Allergic rhinitis    severe ([facility])   Asthma    as a child   History of chicken pox    Hypertension    Shingles 1990s    Past Surgical History:  Procedure Laterality Date   COLONOSCOPY  02/2018   hemorrhoids, WNL ([facility])   EMBOLIZATION N/A 08/01/2020   Procedure: EMBOLIZATION;  Surgeon: Algernon Huxley, MD;  Location: [facility];  Service: Cardiovascular;  Laterality: N/A;    Current Medications: Current Meds  Medication Sig   diltiazem (CARDIZEM CD) 120 MG 24 hr capsule Take 1 capsule (120 mg total) by mouth daily.   Multiple Vitamin (MULTIVITAMIN PO) Take 1 tablet by mouth daily.   XHANCE 93 MCG/ACT EXHU BLOW 2 DOSES IN EACH NOSTRIL TWICE DAILY (Patient taking differently: Place 2 sprays into both nostrils 2 (two) times daily.)    Allergies:   Sulfa antibiotics, Other, and Sulfa drugs cross reactors   Social History   Socioeconomic History   Marital status: Single    Spouse name: Not on file    Number of children: Not on file   Years of education: Not on file   Highest education level: Not on file  Occupational History   Occupation: Chemical engineer: [employer]  Tobacco Use   Smoking status: Former    Years: 2.00    Types: Cigarettes    Quit date: 2006    Years since quitting: 18.1   Smokeless tobacco: Never   Tobacco comments:    1cig daily  Vaping Use   Vaping Use: Never used  Substance and Sexual Activity   Alcohol use: Yes    Alcohol/week: 1.0 standard drink of alcohol    Types: 1 Glasses of wine per week    Comment: occassionally   Drug use: No   Sexual activity: Yes    Birth control/protection: None, Condom  Other Topics Concern   Not on file  Social History Narrative   Caffeine: none   Lives with daughter (2006), no pets   Occupation: Tour manager   Edu: bachelor's   Activity: treadmill and workout tapes   Diet: seldom water, fruits/vegetables daily, red meat rare, fish 3x/wk   Social Determinants of Radio broadcast assistant Strain: Not on file  Food Insecurity: Not on file  Transportation Needs: Not on file  Physical Activity: Not on file  Stress: Not on file  Social Connections: Not on file     Family History:  The patient's family history includes Asthma in her daughter; Cancer in her maternal grandfather; Diabetes in her maternal aunt, maternal grandfather, and maternal uncle; Eczema in her daughter; Heart failure in her cousin and maternal grandmother; Hypertension in her brother, maternal aunt, maternal uncle, and mother; Stroke in her maternal uncle. There is no history of Allergic rhinitis or Urticaria.  ROS:   12-point review of systems is negative unless otherwise noted in HPI.   EKGs/Labs/Other Studies Reviewed:    Studies reviewed were summarized above. The additional studies were reviewed today:  2D echo 09/2019: 1. Left ventricular ejection fraction, by estimation, is 55 to 60%. The  left ventricle has  normal function. The left ventricle has no regional  wall motion abnormalities. Left ventricular diastolic parameters were  normal.   2. Right ventricular systolic function is normal. The right ventricular  size is normal. There is normal pulmonary artery systolic pressure. The  estimated right ventricular systolic pressure is 0000000 mmHg.   3. Mild mitral valve regurgitation.  __________   Elwyn Reach patch 10/2019:   The predominant rhythm was sinus with an average rate of 84 bpm (range 55-162 bpm). There were rare PAC's. No sustained arrhythmia or prolonged pause occurred. Patient triggered events correspond to sinus rhythm and PAC's.   Predominantly sinus rhythm with rare PAC's.  No significant arrhythmia observed.   EKG:  EKG is ordered today.  The EKG ordered today demonstrates NSR, 67 bpm, no acute ST-T changes  Recent Labs: No results  found for requested labs within last 365 days.  Recent Lipid Panel    Component Value Date/Time   CHOL 192 01/11/2020 1559   TRIG 82.0 01/11/2020 1559   HDL 54.10 01/11/2020 1559   CHOLHDL 4 01/11/2020 1559   VLDL 16.4 01/11/2020 1559   LDLCALC 121 (H) 01/11/2020 1559    PHYSICAL EXAM:    VS:  BP 121/85 (BP Location: Left Arm, Patient Position: Sitting, Cuff Size: Normal)   Pulse 67   Ht 5' 2"$  (1.575 m)   Wt 193 lb (87.5 kg)   SpO2 98%   BMI 35.30 kg/m   BMI: Body mass index is 35.3 kg/m.  Physical Exam Vitals reviewed.  Constitutional:      Appearance: She is well-developed.  HENT:     Head: Normocephalic and atraumatic.  Eyes:     General:        Right eye: No discharge.        Left eye: No discharge.  Neck:     Vascular: No JVD.  Cardiovascular:     Rate and Rhythm: Normal rate and regular rhythm.     Pulses:          Posterior tibial pulses are 2+ on the right side and 2+ on the left side.     Heart sounds: Normal heart sounds, S1 normal and S2 normal. Heart sounds not distant. No midsystolic click and no opening snap. No  murmur heard.    No friction rub.  Pulmonary:     Effort: Pulmonary effort is normal. No respiratory distress.     Breath sounds: Normal breath sounds. No decreased breath sounds, wheezing or rales.  Chest:     Chest wall: No tenderness.  Abdominal:     General: There is no distension.  Musculoskeletal:     Cervical back: Normal range of motion.     Right lower leg: No edema.     Left lower leg: No edema.  Skin:    General: Skin is warm and dry.     Nails: There is no clubbing.  Neurological:     Mental Status: She is alert and oriented to person, place, and time.  Psychiatric:        Speech: Speech normal.        Behavior: Behavior normal.        Thought Content: Thought content normal.        Judgment: Judgment normal.     Wt Readings from Last 3 Encounters:  04/06/22 193 lb (87.5 kg)  11/29/20 184 lb (83.5 kg)  11/18/20 180 lb (81.6 kg)     ASSESSMENT & PLAN:   Palpitations/PACs: Quiescent.  Restart diltiazem 120 mg daily.  No further workup indicated at this time.  HTN: Blood pressure is well-controlled in the office today.,  However she has noted some fluctuations in blood pressure at times into the 130s over 90s.  Restart long-acting diltiazem 120 mg daily.   Disposition: F/u with Dr. Saunders Revel or an APP in 6 months.   Medication Adjustments/Labs and Tests Ordered: Current medicines are reviewed at length with the patient today.  Concerns regarding medicines are outlined above. Medication changes, Labs and Tests ordered today are summarized above and listed in the Patient Instructions accessible in Encounters.   SignedChristell Faith, PA-C 04/06/2022 12:29 PM     [facility] [address] [place], [place] [address] [phone]

## 2022-04-06 ENCOUNTER — Encounter: Payer: Self-pay | Admitting: Physician Assistant

## 2022-04-06 ENCOUNTER — Ambulatory Visit: Payer: Medicaid Other | Attending: Physician Assistant | Admitting: Physician Assistant

## 2022-04-06 VITALS — BP 121/85 | HR 67 | Ht 62.0 in | Wt 193.0 lb

## 2022-04-06 DIAGNOSIS — I491 Atrial premature depolarization: Secondary | ICD-10-CM | POA: Diagnosis not present

## 2022-04-06 DIAGNOSIS — I10 Essential (primary) hypertension: Secondary | ICD-10-CM

## 2022-04-06 DIAGNOSIS — R002 Palpitations: Secondary | ICD-10-CM | POA: Diagnosis not present

## 2022-04-06 MED ORDER — DILTIAZEM HCL ER COATED BEADS 120 MG PO CP24: 120.0000 mg | ORAL_CAPSULE | Freq: Every day | ORAL | 3 refills | Status: DC

## 2022-04-06 NOTE — Patient Instructions (Signed)
Medication Instructions:  Your physician has recommended you make the following change in your medication:   START Diltiazem 120 mg once daily   *If you need a refill on your cardiac medications before your next appointment, please call your pharmacy*   Lab Work: None  If you have labs (blood work) drawn today and your tests are completely normal, you will receive your results only by: [place] (if you have MyChart) OR A paper copy in the mail If you have any lab test that is abnormal or we need to change your treatment, we will call you to review the results.   Testing/Procedures: None   Follow-Up: At [facility], you and your health needs are our priority.  As part of our continuing mission to provide you with exceptional heart care, we have created designated Provider Care Teams.  These Care Teams include your primary Cardiologist (physician) and Advanced Practice Providers (APPs -  Physician Assistants and Nurse Practitioners) who all work together to provide you with the care you need, when you need it.   Your next appointment:   6 month(s)  Provider:   Nelva Bush, MD or Christell Faith, PA-C

## 2022-07-18 ENCOUNTER — Other Ambulatory Visit: Payer: Self-pay | Admitting: Obstetrics and Gynecology

## 2022-07-18 DIAGNOSIS — N6489 Other specified disorders of breast: Secondary | ICD-10-CM

## 2022-07-18 DIAGNOSIS — N63 Unspecified lump in unspecified breast: Secondary | ICD-10-CM

## 2022-07-18 DIAGNOSIS — R928 Other abnormal and inconclusive findings on diagnostic imaging of breast: Secondary | ICD-10-CM

## 2022-08-10 DIAGNOSIS — K080 Exfoliation of teeth due to systemic causes: Secondary | ICD-10-CM | POA: Diagnosis not present

## 2022-11-30 ENCOUNTER — Ambulatory Visit
Admission: RE | Admit: 2022-11-30 | Discharge: 2022-11-30 | Disposition: A | Discharge status: Home / Self Care | Payer: Federal, State, Local not specified - PPO | Source: Ambulatory Visit | Attending: Obstetrics and Gynecology | Admitting: Obstetrics and Gynecology

## 2022-11-30 DIAGNOSIS — R928 Other abnormal and inconclusive findings on diagnostic imaging of breast: Secondary | ICD-10-CM | POA: Diagnosis not present

## 2022-11-30 DIAGNOSIS — N6489 Other specified disorders of breast: Secondary | ICD-10-CM | POA: Insufficient documentation

## 2022-11-30 DIAGNOSIS — R92333 Mammographic heterogeneous density, bilateral breasts: Secondary | ICD-10-CM | POA: Diagnosis not present

## 2022-11-30 DIAGNOSIS — N63 Unspecified lump in unspecified breast: Secondary | ICD-10-CM | POA: Diagnosis not present

## 2022-11-30 DIAGNOSIS — R300 Dysuria: Secondary | ICD-10-CM | POA: Diagnosis not present

## 2022-11-30 DIAGNOSIS — N6315 Unspecified lump in the right breast, overlapping quadrants: Secondary | ICD-10-CM | POA: Diagnosis not present

## 2022-11-30 DIAGNOSIS — Z681 Body mass index (BMI) 19 or less, adult: Secondary | ICD-10-CM | POA: Diagnosis not present

## 2022-11-30 DIAGNOSIS — N6312 Unspecified lump in the right breast, upper inner quadrant: Secondary | ICD-10-CM | POA: Diagnosis not present

## 2022-12-01 ENCOUNTER — Encounter: Payer: Self-pay | Admitting: Obstetrics and Gynecology

## 2023-07-05 ENCOUNTER — Encounter: Payer: Self-pay | Admitting: Physician Assistant

## 2023-07-05 ENCOUNTER — Ambulatory Visit: Attending: Physician Assistant | Admitting: Physician Assistant

## 2023-07-05 VITALS — BP 110/82 | HR 69 | Ht 62.0 in | Wt 169.8 lb

## 2023-07-05 DIAGNOSIS — I491 Atrial premature depolarization: Secondary | ICD-10-CM

## 2023-07-05 DIAGNOSIS — R002 Palpitations: Secondary | ICD-10-CM

## 2023-07-05 DIAGNOSIS — I10 Essential (primary) hypertension: Secondary | ICD-10-CM | POA: Diagnosis not present

## 2023-07-05 MED ORDER — DILTIAZEM HCL 30 MG PO TABS: 30.0000 mg | ORAL_TABLET | Freq: Three times a day (TID) | ORAL | 3 refills | Status: AC

## 2023-07-05 NOTE — Patient Instructions (Signed)
 Medication Instructions:  Your physician recommends the following medication changes.  STOP TAKING: Diltiazem  24 Hr ER Capsule  START TAKING: Diltiazem  30 mg 1 tablet 3 times daily  *If you need a refill on your cardiac medications before your next appointment, please call your pharmacy*   Follow-Up: At [facility], you and your health needs are our priority.  As part of our continuing mission to provide you with exceptional heart care, our providers are all part of one team.  This team includes your primary Cardiologist (physician) and Advanced Practice Providers or APPs (Physician Assistants and Nurse Practitioners) who all work together to provide you with the care you need, when you need it.  Your next appointment:   12 month(s)  Provider:   You may see Sammy Crisp, MD or one of the following Advanced Practice Providers on your designated Care Team:   Laneta Pintos, NP Gildardo Labrador, PA-C Varney Gentleman, PA-C Cadence [place], PA-C Ronald Cockayne, NP Morey Ar, NP    We recommend signing up for the patient portal called "MyChart".  Sign up information is provided on this After Visit Summary.  MyChart is used to connect with patients for Virtual Visits (Telemedicine).  Patients are able to view lab/test results, encounter notes, upcoming appointments, etc.  Non-urgent messages can be sent to your provider as well.   To learn more about what you can do with MyChart, go to [email or web address].

## 2023-07-05 NOTE — Progress Notes (Signed)
 Cardiology Office Note    Date:  07/05/2023   ID:  Sabrina Smith, DOB Feb 07, 1981, MRN [id number]  PCP:  Sabrina Crick, MD  Cardiologist:  Sammy Crisp, MD  Electrophysiologist:  None   Chief Complaint: Follow up  History of Present Illness:   Sabrina Smith is a 43 y.o. female with history of HTN, palpitations, asthma, and GERD who presents for follow up of palpitations.    She was seen by Dr. Nolan Smith as a new patient for evaluation of palpitations on 09/30/2019. She had reported a 1-year history of palpitations, most notable when she was still.  She also noted atypical chest pain.  EKG with her PCP's office interpreted as sinus bradycardia with frequent PACs. EKG at her visit with Dr. Nolan Smith showed sinus rhythm with frequent premature junctional beats, borderline LVH, and poor R wave progression. There was no significant change when compared to study performed in her PCP's office. Echo showed an EF of 55-60%, no RWMA, normal LV diastolic function parameters, normal RVSF and ventricular cavity size, mild mitral regurgitation, and normal PASP. She was seen in follow up by Sabrina Kitchens, PA-C on 11/10/2019 and had decreased her caffeine  intake, though continued to note palpitations. In this setting, she was started on Cardizem  CD 120 mg. Beta blocker was deferred in the context of significant underlying allergies, for which she carried an EpiPen . With regards to fatigue, she was referred to pulmonology for sleep study.  In follow-up, in 10/2019, she did not note any significant change in her palpitation burden following the addition of Cardizem  CD.  In this setting, she underwent Zio patch which demonstrated a predominant rhythm of sinus with an average heart rate of 84 bpm (range 55 to 162 bpm), rare PACs, no sustained arrhythmias or prolonged pauses, patient triggered events corresponded to sinus rhythm with PACs.  She was last seen in the office in 03/2022 and had been out of diltiazem  for  several months without an increase in palpitations.  Blood pressure was mildly elevated at times.  She was restarted on Cardizem  CD 120 mg daily.  She comes in doing well from a cardiac perspective and is without symptoms of angina or cardiac decompensation.  Since she was last seen she previously resided in [place]  and establish care with a new PCP.  At that time she was transitioned from diltiazem  120 mg to propranolol 60 mg.  With this she does feel like she is less fatigued and has noted an improvement in her palpitations.  She does continue to intermittently take diltiazem  120 mg if she has an increase in palpitations.  No frank angina, presyncope, or syncope.  No significant lower extremity swelling.  She reports labs were updated while in [place] .   Labs independently reviewed: 07/2020 - potassium 3.9, BUN 9, serum creatinine 0.67, BUN 4.2, AST/ALT normal, Hgb 14.4, PLT 265 12/2019 - TC 192, TG 82, HDL 54, LDL 121, magnesium 2.1 05/2019 - TSH normal  Past Medical History:  Diagnosis Date   Allergic rhinitis    severe ([place])   Asthma    as a child   History of chicken pox    Hypertension    Shingles 1990s    Past Surgical History:  Procedure Laterality Date   COLONOSCOPY  02/2018   hemorrhoids, WNL ([facility])   EMBOLIZATION (CATH LAB) N/A 08/01/2020   Procedure: EMBOLIZATION;  Surgeon: Celso College, MD;  Location: [facility];  Service: Cardiovascular;  Laterality: N/A;  Current Medications: Current Meds  Medication Sig   diltiazem  (CARDIZEM ) 30 MG tablet Take 1 tablet (30 mg total) by mouth 3 (three) times daily.   hyoscyamine (LEVSIN) 0.125 MG tablet Take by mouth every 4 (four) hours as needed for bladder spasms.   MOUNJARO 10 MG/0.5ML Pen    Multiple Vitamin (MULTIVITAMIN PO) Take 1 tablet by mouth daily.   propranolol ER (INDERAL LA) 60 MG 24 hr capsule Take 60 mg by mouth daily.   XHANCE  93 MCG/ACT EXHU BLOW 2 DOSES IN EACH NOSTRIL TWICE DAILY (Patient taking  differently: Place 2 sprays into both nostrils 2 (two) times daily.)   [DISCONTINUED] diltiazem  (TIAZAC ) 120 MG 24 hr capsule Take 120 mg by mouth daily.    Allergies:   Sulfa antibiotics, Other, and Sulfa drugs cross reactors   Social History   Socioeconomic History   Marital status: Single    Spouse name: Not on file   Number of children: Not on file   Years of education: Not on file   Highest education level: Not on file  Occupational History   Occupation: Training and development officer: [employer]  Tobacco Use   Smoking status: Former    Current packs/day: 0.00    Types: Cigarettes    Start date: 2004    Quit date: 2006    Years since quitting: 19.3   Smokeless tobacco: Never   Tobacco comments:    1cig daily  Vaping Use   Vaping status: Never Used  Substance and Sexual Activity   Alcohol use: Yes    Alcohol/week: 1.0 standard drink of alcohol    Types: 1 Glasses of wine per week    Comment: occassionally   Drug use: No   Sexual activity: Yes    Birth control/protection: None, Condom  Other Topics Concern   Not on file  Social History Narrative   Caffeine : none   Lives with daughter (2006), no pets   Occupation: Paramedic   Edu: bachelor's   Activity: treadmill and workout tapes   Diet: seldom water, fruits/vegetables daily, red meat rare, fish 3x/wk   Social Drivers of Corporate investment banker Strain: Not on file  Food Insecurity: Not on file  Transportation Needs: Not on file  Physical Activity: Not on file  Stress: Not on file  Social Connections: Not on file     Family History:  The patient's family history includes Asthma in her daughter; Cancer in her maternal grandfather; Diabetes in her maternal aunt, maternal grandfather, and maternal uncle; Eczema in her daughter; Heart failure in her cousin and maternal grandmother; Hypertension in her brother, maternal aunt, maternal uncle, and mother; Stroke in her maternal uncle. There is no history  of Allergic rhinitis or Urticaria.  ROS:   12-point review of systems is negative unless otherwise noted in the HPI.   EKGs/Labs/Other Studies Reviewed:    Studies reviewed were summarized above. The additional studies were reviewed today:  2D echo 09/2019: 1. Left ventricular ejection fraction, by estimation, is 55 to 60%. The  left ventricle has normal function. The left ventricle has no regional  wall motion abnormalities. Left ventricular diastolic parameters were  normal.   2. Right ventricular systolic function is normal. The right ventricular  size is normal. There is normal pulmonary artery systolic pressure. The  estimated right ventricular systolic pressure is 27.3 mmHg.   3. Mild mitral valve regurgitation.  __________   Zio patch 10/2019:   The predominant  rhythm was sinus with an average rate of 84 bpm (range 55-162 bpm). There were rare PAC's. No sustained arrhythmia or prolonged pause occurred. Patient triggered events correspond to sinus rhythm and PAC's.   Predominantly sinus rhythm with rare PAC's.  No significant arrhythmia observed.   EKG:  EKG is ordered today.  The EKG ordered today demonstrates NSR, 69 bpm, nonspecific ST-T changes, consistent with prior tracings  Recent Labs: No results found for requested labs within last 365 days.  Recent Lipid Panel    Component Value Date/Time   CHOL 192 01/11/2020 1559   TRIG 82.0 01/11/2020 1559   HDL 54.10 01/11/2020 1559   CHOLHDL 4 01/11/2020 1559   VLDL 16.4 01/11/2020 1559   LDLCALC 121 (H) 01/11/2020 1559    PHYSICAL EXAM:    VS:  BP 110/82 (BP Location: Left Arm, Patient Position: Sitting, Cuff Size: Normal)   Pulse 69   Ht 5\' 2"  (1.575 m)   Wt 169 lb 12.8 oz (77 kg)   SpO2 96%   BMI 31.06 kg/m   BMI: Body mass index is 31.06 kg/m.  Physical Exam Vitals reviewed.  Constitutional:      Appearance: She is well-developed.  HENT:     Head: Normocephalic and atraumatic.  Eyes:     General:         Right eye: No discharge.        Left eye: No discharge.  Cardiovascular:     Rate and Rhythm: Normal rate and regular rhythm.     Pulses:          Posterior tibial pulses are 2+ on the right side and 2+ on the left side.     Heart sounds: Normal heart sounds, S1 normal and S2 normal. Heart sounds not distant. No midsystolic click and no opening snap. No murmur heard.    No friction rub.  Pulmonary:     Effort: Pulmonary effort is normal. No respiratory distress.     Breath sounds: Normal breath sounds. No decreased breath sounds, wheezing, rhonchi or rales.  Chest:     Chest wall: No tenderness.  Musculoskeletal:     Cervical back: Normal range of motion.     Right lower leg: No edema.     Left lower leg: No edema.  Skin:    General: Skin is warm and dry.     Nails: There is no clubbing.  Neurological:     Mental Status: She is alert and oriented to person, place, and time.  Psychiatric:        Speech: Speech normal.        Behavior: Behavior normal.        Thought Content: Thought content normal.        Judgment: Judgment normal.     Wt Readings from Last 3 Encounters:  07/05/23 169 lb 12.8 oz (77 kg)  04/06/22 193 lb (87.5 kg)  11/29/20 184 lb (83.5 kg)     ASSESSMENT & PLAN:   Palpitations/PACs: Symptoms overall controlled with improvement in fatigue following transition from diltiazem  to propranolol 60 mg daily which will be continued.  We will add short acting diltiazem  30 mg 3 times daily as needed for sustained tachypalpitations.  HTN: Blood pressure is well-controlled in the office today.  Pharmacotherapy as above.   Disposition: F/u with Dr. Nolan Smith or an APP in 12 months.   Medication Adjustments/Labs and Tests Ordered: Current medicines are reviewed at length with the patient today.  Concerns regarding  medicines are outlined above. Medication changes, Labs and Tests ordered today are summarized above and listed in the Patient Instructions accessible in  Encounters.   Signed, Varney Gentleman, PA-C 07/05/2023 4:50 PM     [facility] [place] [address] [place], [place] [address] [phone]

## 2023-07-21 ENCOUNTER — Ambulatory Visit
Admission: EM | Admit: 2023-07-21 | Discharge: 2023-07-21 | Disposition: A | Discharge status: Home / Self Care | Attending: Emergency Medicine | Admitting: Emergency Medicine

## 2023-07-21 DIAGNOSIS — J069 Acute upper respiratory infection, unspecified: Secondary | ICD-10-CM

## 2023-07-21 MED ORDER — BENZONATATE 100 MG PO CAPS: 100.0000 mg | ORAL_CAPSULE | Freq: Three times a day (TID) | ORAL | 0 refills | Status: AC

## 2023-07-21 MED ORDER — PREDNISONE 10 MG (21) PO TBPK: ORAL_TABLET | Freq: Every day | ORAL | 0 refills | Status: AC

## 2023-07-21 MED ORDER — PROMETHAZINE-DM 6.25-15 MG/5ML PO SYRP: 5.0000 mL | ORAL_SOLUTION | Freq: Every evening | ORAL | 0 refills | Status: AC | PRN

## 2023-07-21 MED ORDER — AMOXICILLIN-POT CLAVULANATE 875-125 MG PO TABS: 1.0000 | ORAL_TABLET | Freq: Two times a day (BID) | ORAL | 0 refills | Status: AC

## 2023-07-21 NOTE — ED Triage Notes (Signed)
 Pt c/o cough,congestion & bodyaches x6 days. Has tried mucinex  w/o relief.

## 2023-07-21 NOTE — Discharge Instructions (Signed)
 Your symptoms today are most likely being caused by a virus and should steadily improve in time it can take up to 7 to 10 days before you truly start to see a turnaround however things will get better if no improvement in your symptoms by Wednesday may pick up Augmentin from the pharmacy  In the meantime take prednisone every morning with food to help reduce sinus pressure, may take Tylenol  while using this medicine but no ibuprofen  May use Tessalon pill every 8 hours as needed for cough, may use cough syrup at bedtime to allow you to rest   For cough: honey 1/2 to 1 teaspoon (you can dilute the honey in water or another fluid).  You can also use guaifenesin  and dextromethorphan  for cough. You can use a humidifier for chest congestion and cough.  If you don't have a humidifier, you can sit in the bathroom with the hot shower running.      For sore throat: try warm salt water gargles, cepacol lozenges, throat spray, warm tea or water with lemon/honey, popsicles or ice, or OTC cold relief medicine for throat discomfort.   For congestion: take a daily anti-histamine like Zyrtec, Claritin, and a oral decongestant, such as pseudoephedrine.  You can also use Flonase  1-2 sprays in each nostril daily.   It is important to stay hydrated: drink plenty of fluids (water, gatorade/powerade/pedialyte, juices, or teas) to keep your throat moisturized and help further relieve irritation/discomfort.

## 2023-07-21 NOTE — ED Provider Notes (Signed)
 Arlander Bellman    CSN: [id number] Arrival date & time: 07/21/23  1247      History   Chief Complaint Chief Complaint  Patient presents with   Cough   Nasal Congestion   Generalized Body Aches    HPI Sabrina Smith is a 43 y.o. female.   Patient presents for evaluation of subjective fever, nasal congestion, rhinorrhea, intermittent headaches, productive cough, postnasal drip, hoarseness and sinus pressure to the right side of the face present for 5 days.  No known sick contacts prior.  Tolerating food and liquids.  Has attempted use of Mucinex , Nasonex  and saline rinse.  Denies shortness of breath or wheezing.  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   Chest pain of uncertain etiology 10/01/2019   Palpitations 09/28/2019   Stress 03/25/2019   Eustachian tube dysfunction 10/06/2018   Medical certificate issuance 06/16/2018   Adverse food reaction 05/08/2018   Abdominal discomfort, epigastric 02/12/2018   Coccydynia 01/15/2018   Perennial allergic rhinitis with seasonal variation 12/11/2017   Hyperhidrosis 04/19/2017   Family history of thyroid  disease 04/05/2017   Essential hypertension 11/01/2016   Acute nonintractable headache 11/01/2016   GERD (gastroesophageal reflux disease) 09/21/2015   Allergic rhinitis 10/28/2014   Mild intermittent asthma without complication 10/28/2014   Multiple food allergies 10/28/2014   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 Authorizing Provider  amoxicillin -clavulanate (AUGMENTIN) 875-125 MG tablet Take 1 tablet by mouth every 12 (twelve) hours. 07/24/23  Yes Annemarie Sebree R, NP  benzonatate (TESSALON) 100 MG capsule Take 1 capsule (100 mg total) by mouth every 8 (eight) hours. 07/21/23  Yes Bonnee Zertuche R, NP  predniSONE (STERAPRED UNI-PAK 21 TAB) 10 MG (21) TBPK tablet Take by mouth daily. Take 6 tabs by mouth daily  for 1 days, then 5 tabs for 1 days, then 4 tabs for 1 days, then 3 tabs for 1 days, 2 tabs for 1 days, then 1 tab by mouth daily for 1 days 07/21/23  Yes Latorie Montesano R, NP  promethazine-dextromethorphan  (PROMETHAZINE-DM) 6.25-15 MG/5ML syrup Take 5 mLs by mouth at bedtime as needed.  07/21/23  Yes Lunabelle Oatley R, NP  diltiazem  (CARDIZEM ) 30 MG tablet Take 1 tablet (30 mg total) by mouth 3 (three) times daily. 07/05/23  Yes Dunn, Elvia Hammans, PA-C  hyoscyamine (LEVSIN) 0.125 MG tablet Take by mouth every 4 (four) hours as needed for bladder spasms. 06/03/23  Yes [provider]  MOUNJARO 10 MG/0.5ML Pen  05/26/23  Yes [provider]  Multiple Vitamin (MULTIVITAMIN PO) Take 1 tablet by mouth daily.   Yes [provider]  propranolol ER (INDERAL LA) 60 MG 24 hr capsule Take 60 mg by mouth daily.   Yes [provider]  XHANCE  93 MCG/ACT EXHU BLOW 2 DOSES IN EACH NOSTRIL TWICE DAILY Patient taking differently: Place 2 sprays into both nostrils 2 (two) times  daily. 07/08/19   Bobbitt, Colen Daunt, MD    Family History Family History  Problem Relation Age of Onset   Diabetes Maternal Aunt    Hypertension Maternal Aunt    Diabetes Maternal Uncle    Hypertension Maternal Uncle    Stroke Maternal Uncle    Heart failure Maternal Grandmother    Diabetes Maternal Grandfather    Cancer Maternal Grandfather        prostate   Hypertension Mother    Hypertension Brother    Asthma Daughter    Eczema Daughter    Heart failure Cousin    Allergic rhinitis Neg Hx    Urticaria Neg Hx     Social History Social History   Tobacco Use   Smoking status: Former    Current packs/day: 0.00    Types: Cigarettes    Start date: 2004    Quit date: 2006    Years since quitting: 19.4   Smokeless tobacco: Never   Tobacco comments:    1cig daily  Vaping Use   Vaping status: Never Used  Substance Use Topics   Alcohol use: Yes    Alcohol/week: 1.0 standard drink of alcohol    Types: 1 Glasses of wine per week    Comment: occassionally   Drug use: No     Allergies   Sulfa antibiotics, Other, and Sulfa drugs cross reactors   Review of Systems Review of Systems   Physical Exam Triage Vital Signs ED Triage Vitals  Encounter Vitals Group     BP 07/21/23 1257 (!) 138/94     Systolic BP Percentile --      Diastolic BP Percentile --      Pulse Rate 07/21/23 1257 73     Resp 07/21/23 1257 16     Temp 07/21/23 1257 98.3 F (36.8 C)     Temp Source 07/21/23 1257 Oral     SpO2 07/21/23 1257 98 %     Weight 07/21/23 1256 169 lb 12.8 oz (77 kg)     Height 07/21/23 1256 5\' 2"  (1.575 m)     Head Circumference --      Peak Flow --      Pain Score 07/21/23 1301 2     Pain Loc --      Pain Education --      Exclude from Growth Chart --    No data found.  Updated Vital Signs BP (!) 138/94 (BP Location: Left Arm)   Pulse 73   Temp 98.3 F (36.8 C) (Oral)   Resp 16   Ht 5\' 2"  (1.575 m)   Wt 169 lb 12.8 oz (77 kg)   LMP 07/11/2023 (Exact  Date)   SpO2 98%  BMI 31.06 kg/m   Visual Acuity Right Eye Distance:   Left Eye Distance:   Bilateral Distance:    Right Eye Near:   Left Eye Near:    Bilateral Near:     Physical Exam Constitutional:      Appearance: Normal appearance.  HENT:     Head: Normocephalic.     Right Ear: Tympanic membrane, ear canal and external ear normal.     Left Ear: Tympanic membrane, ear canal and external ear normal.     Nose: Congestion present.     Mouth/Throat:     Mouth: Mucous membranes are moist.     Pharynx: Oropharynx is clear.  Eyes:     Extraocular Movements: Extraocular movements intact.  Cardiovascular:     Rate and Rhythm: Normal rate and regular rhythm.     Pulses: Normal pulses.     Heart sounds: Normal heart sounds.  Pulmonary:     Effort: Pulmonary effort is normal.     Breath sounds: Normal breath sounds.  Neurological:     Mental Status: She is alert.      UC Treatments / Results  Labs (all labs ordered are listed, but only abnormal results are displayed) Labs Reviewed - No data to display  EKG   Radiology No results found.  Procedures Procedures (including critical care time)  Medications Ordered in UC Medications - No data to display  Initial Impression / Assessment and Plan / UC Course  I have reviewed the triage vital signs and the nursing notes.  Pertinent labs & imaging results that were available during my care of the patient were reviewed by me and considered in my medical decision making (see chart for details).  Viral URI with cough  Patient is in no signs of distress nor toxic appearing.  Vital signs are stable.  Low suspicion for pneumonia, pneumothorax or bronchitis and therefore will defer imaging.  Prescribed prednisone, Tessalon and Promethazine DM, watch and wait antibiotic placed at pharmacy if no improvement seen.May use additional over-the-counter medications as needed for supportive care.  May follow-up with urgent care as  needed if symptoms persist or worsen.   Final Clinical Impressions(s) / UC Diagnoses   Final diagnoses:  Viral URI with cough   Discharge Instructions      Your symptoms today are most likely being caused by a virus and should steadily improve in time it can take up to 7 to 10 days before you truly start to see a turnaround however things will get better if no improvement in your symptoms by Wednesday may pick up Augmentin from the pharmacy  In the meantime take prednisone every morning with food to help reduce sinus pressure, may take Tylenol  while using this medicine but no ibuprofen  May use Tessalon pill every 8 hours as needed for cough, may use cough syrup at bedtime to allow you to rest   For cough: honey 1/2 to 1 teaspoon (you can dilute the honey in water or another fluid).  You can also use guaifenesin  and dextromethorphan  for cough. You can use a humidifier for chest congestion and cough.  If you don't have a humidifier, you can sit in the bathroom with the hot shower running.      For sore throat: try warm salt water gargles, cepacol lozenges, throat spray, warm tea or water with lemon/honey, popsicles or ice, or OTC cold relief medicine for throat discomfort.   For congestion: take a daily anti-histamine like Zyrtec, Claritin, and  a oral decongestant, such as pseudoephedrine.  You can also use Flonase  1-2 sprays in each nostril daily.   It is important to stay hydrated: drink plenty of fluids (water, gatorade/powerade/pedialyte, juices, or teas) to keep your throat moisturized and help further relieve irritation/discomfort.   ED Prescriptions     Medication Sig Dispense Auth. Provider   predniSONE (STERAPRED UNI-PAK 21 TAB) 10 MG (21) TBPK tablet Take by mouth daily. Take 6 tabs by mouth daily  for 1 days, then 5 tabs for 1 days, then 4 tabs for 1 days, then 3 tabs for 1 days, 2 tabs for 1 days, then 1 tab by mouth daily for 1 days 21 tablet Shara Hartis R, NP    benzonatate (TESSALON) 100 MG capsule Take 1 capsule (100 mg total) by mouth every 8 (eight) hours. 21 capsule Hadar Elgersma R, NP   promethazine-dextromethorphan  (PROMETHAZINE-DM) 6.25-15 MG/5ML syrup Take 5 mLs by mouth at bedtime as needed. 118 mL Franciscojavier Wronski R, NP   amoxicillin -clavulanate (AUGMENTIN) 875-125 MG tablet Take 1 tablet by mouth every 12 (twelve) hours. 14 tablet Antwuan Eckley R, NP      PDMP not reviewed this encounter.   Reena Canning, NP 07/21/23 1322

## 2023-11-29 ENCOUNTER — Other Ambulatory Visit: Payer: Self-pay | Admitting: Obstetrics and Gynecology

## 2023-11-29 DIAGNOSIS — Z1231 Encounter for screening mammogram for malignant neoplasm of breast: Secondary | ICD-10-CM

## 2023-11-29 DIAGNOSIS — N63 Unspecified lump in unspecified breast: Secondary | ICD-10-CM

## 2023-12-06 ENCOUNTER — Ambulatory Visit
Admission: RE | Admit: 2023-12-06 | Discharge: 2023-12-06 | Disposition: A | Discharge status: Home / Self Care | Source: Ambulatory Visit | Attending: Obstetrics and Gynecology | Admitting: Obstetrics and Gynecology

## 2023-12-06 DIAGNOSIS — N63 Unspecified lump in unspecified breast: Secondary | ICD-10-CM

## 2023-12-06 DIAGNOSIS — N6312 Unspecified lump in the right breast, upper inner quadrant: Secondary | ICD-10-CM | POA: Diagnosis not present

## 2023-12-06 DIAGNOSIS — R92333 Mammographic heterogeneous density, bilateral breasts: Secondary | ICD-10-CM | POA: Diagnosis not present

## 2023-12-06 DIAGNOSIS — Z1231 Encounter for screening mammogram for malignant neoplasm of breast: Secondary | ICD-10-CM

## 2023-12-06 DIAGNOSIS — N6315 Unspecified lump in the right breast, overlapping quadrants: Secondary | ICD-10-CM | POA: Diagnosis not present

## 2023-12-08 ENCOUNTER — Ambulatory Visit: Payer: Self-pay | Admitting: Obstetrics and Gynecology

## 2023-12-20 DIAGNOSIS — F4322 Adjustment disorder with anxiety: Secondary | ICD-10-CM | POA: Diagnosis not present

## 2023-12-25 DIAGNOSIS — F4322 Adjustment disorder with anxiety: Secondary | ICD-10-CM | POA: Diagnosis not present

## 2024-01-08 DIAGNOSIS — F4322 Adjustment disorder with anxiety: Secondary | ICD-10-CM | POA: Diagnosis not present

## 2024-02-02 DIAGNOSIS — J069 Acute upper respiratory infection, unspecified: Secondary | ICD-10-CM | POA: Diagnosis not present

## 2024-02-07 DIAGNOSIS — F4322 Adjustment disorder with anxiety: Secondary | ICD-10-CM | POA: Diagnosis not present

## 2024-02-12 DIAGNOSIS — F4322 Adjustment disorder with anxiety: Secondary | ICD-10-CM | POA: Diagnosis not present
